# Patient Record
Sex: Male | Born: 1955 | Race: White | Hispanic: No | Smoking: Never smoker
Health system: Southern US, Community
[De-identification: ages and names within clinical notes are randomized; demographics above are authoritative.]

## PROBLEM LIST (undated history)

## (undated) DIAGNOSIS — N189 Chronic kidney disease, unspecified: Secondary | ICD-10-CM

## (undated) DIAGNOSIS — Z87442 Personal history of urinary calculi: Secondary | ICD-10-CM

## (undated) DIAGNOSIS — I739 Peripheral vascular disease, unspecified: Secondary | ICD-10-CM

## (undated) DIAGNOSIS — D72829 Elevated white blood cell count, unspecified: Secondary | ICD-10-CM

## (undated) DIAGNOSIS — E119 Type 2 diabetes mellitus without complications: Secondary | ICD-10-CM

## (undated) DIAGNOSIS — N183 Chronic kidney disease, stage 3 unspecified: Secondary | ICD-10-CM

## (undated) DIAGNOSIS — I10 Essential (primary) hypertension: Secondary | ICD-10-CM

## (undated) HISTORY — PX: MENISCUS REPAIR: SHX5179

---

## 2002-03-06 ENCOUNTER — Emergency Department (HOSPITAL_COMMUNITY): Admission: EM | Admit: 2002-03-06 | Discharge: 2002-03-06 | Payer: Self-pay | Admitting: Emergency Medicine

## 2005-04-19 ENCOUNTER — Emergency Department (HOSPITAL_COMMUNITY): Admission: EM | Admit: 2005-04-19 | Discharge: 2005-04-19 | Payer: Self-pay | Admitting: *Deleted

## 2005-04-21 ENCOUNTER — Ambulatory Visit (HOSPITAL_COMMUNITY): Admission: AD | Admit: 2005-04-21 | Discharge: 2005-04-21 | Payer: Self-pay | Admitting: Urology

## 2005-04-29 ENCOUNTER — Ambulatory Visit (HOSPITAL_COMMUNITY): Admission: RE | Admit: 2005-04-29 | Discharge: 2005-04-29 | Payer: Self-pay | Admitting: Urology

## 2015-10-05 HISTORY — PX: ROTATOR CUFF REPAIR: SHX139

## 2016-12-28 ENCOUNTER — Emergency Department (HOSPITAL_COMMUNITY): Payer: BLUE CROSS/BLUE SHIELD

## 2016-12-28 ENCOUNTER — Encounter (HOSPITAL_COMMUNITY): Payer: Self-pay

## 2016-12-28 ENCOUNTER — Emergency Department (HOSPITAL_COMMUNITY)
Admission: EM | Admit: 2016-12-28 | Discharge: 2016-12-29 | Disposition: A | Discharge status: Home / Self Care | Payer: BLUE CROSS/BLUE SHIELD | Attending: Emergency Medicine | Admitting: Emergency Medicine

## 2016-12-28 DIAGNOSIS — R1030 Lower abdominal pain, unspecified: Secondary | ICD-10-CM | POA: Diagnosis present

## 2016-12-28 DIAGNOSIS — E860 Dehydration: Secondary | ICD-10-CM | POA: Insufficient documentation

## 2016-12-28 DIAGNOSIS — N200 Calculus of kidney: Secondary | ICD-10-CM

## 2016-12-28 DIAGNOSIS — N1330 Unspecified hydronephrosis: Secondary | ICD-10-CM | POA: Insufficient documentation

## 2016-12-28 DIAGNOSIS — R109 Unspecified abdominal pain: Secondary | ICD-10-CM

## 2016-12-28 MED ORDER — TAMSULOSIN HCL 0.4 MG PO CAPS
0.4000 mg | ORAL_CAPSULE | ORAL | Status: AC
  Administered 2016-12-28: 0.4 mg via ORAL
  Filled 2016-12-28: qty 1

## 2016-12-28 MED ORDER — KETOROLAC TROMETHAMINE 30 MG/ML IJ SOLN
30.0000 mg | Freq: Once | INTRAMUSCULAR | Status: AC
  Administered 2016-12-28: 30 mg via INTRAVENOUS
  Filled 2016-12-28: qty 1

## 2016-12-28 MED ORDER — OXYCODONE-ACETAMINOPHEN 5-325 MG PO TABS
ORAL_TABLET | ORAL | Status: AC
  Filled 2016-12-28: qty 1

## 2016-12-28 MED ORDER — ONDANSETRON HCL 4 MG/2ML IJ SOLN
4.0000 mg | Freq: Once | INTRAMUSCULAR | Status: AC
  Administered 2016-12-28: 4 mg via INTRAVENOUS
  Filled 2016-12-28: qty 2

## 2016-12-28 MED ORDER — HYDROMORPHONE HCL 1 MG/ML IJ SOLN
1.0000 mg | Freq: Once | INTRAMUSCULAR | Status: AC
  Administered 2016-12-28: 1 mg via INTRAVENOUS
  Filled 2016-12-28: qty 1

## 2016-12-28 MED ORDER — ONDANSETRON 4 MG PO TBDP
ORAL_TABLET | ORAL | Status: AC
  Filled 2016-12-28: qty 1

## 2016-12-28 MED ORDER — SODIUM CHLORIDE 0.9 % IV SOLN
Freq: Once | INTRAVENOUS | Status: AC
  Administered 2016-12-28: 50 mL/h via INTRAVENOUS

## 2016-12-28 NOTE — ED Notes (Signed)
Per EDP, patient may have ice chips.  This nurse gave patient ice chips.

## 2016-12-28 NOTE — ED Triage Notes (Signed)
Pt presents to the ed with pain in his right flank that has moved down since yesterday when it started, today it got severe, has a history of kidney stone.  Pt reports using the bathroom like normal and has had minimal nausea

## 2016-12-28 NOTE — ED Provider Notes (Signed)
[facility]-EMERGENCY DEPT Provider Note   CSN: [id number] Arrival date & time: 12/28/16  1800     History   Chief Complaint Chief Complaint  Patient presents with  . Flank Pain    HPI Brent Lam is a 61 y.o. male.  12-year-old male with a history of kidney stones who for the past 3 days has had intermittent pain.  He states on Sunday had 15 hours of continuous pain that spontaneously resolved.  He was okay on Monday with the pain to return today.  He been going on for the past 12 hours.  Initially started in the flank area but now is located in the groin.  He states his last kidney stone was partially 5 years ago and he needed lithotripsy to pass the stone.      History reviewed. No pertinent past medical history.  There are no active problems to display for this patient.   History reviewed. No pertinent surgical history.     Home Medications    Prior to Admission medications   Medication Sig Start Date End Date Taking? Authorizing Provider  naproxen sodium (ANAPROX) 220 MG tablet Take 220 mg by mouth as needed (for pain).   Yes Historical Provider, MD  ondansetron (ZOFRAN) 4 MG tablet Take 1 tablet (4 mg total) by mouth every 6 (six) hours. 12/29/16   Earley Favor, NP  oxyCODONE-acetaminophen (PERCOCET/ROXICET) 5-325 MG tablet Take 1-2 tablets by mouth every 6 (six) hours as needed for severe pain. 12/29/16   Earley Favor, NP  tamsulosin (FLOMAX) 0.4 MG CAPS capsule Take 1 capsule (0.4 mg total) by mouth daily. 12/29/16   Earley Favor, NP    Family History No family history on file.  Social History Social History  Substance Use Topics  . Smoking status: Never Smoker  . Smokeless tobacco: Never Used  . Alcohol use Not on file     Allergies   Patient has no known allergies.   Review of Systems Review of Systems  Constitutional: Negative for chills and fever.  Gastrointestinal: Positive for nausea. Negative for abdominal pain.  Genitourinary: Positive for flank pain.  Negative for dysuria, frequency and hematuria.  All other systems reviewed and are negative.    Physical Exam Updated Vital Signs BP 134/84   Pulse 85   Temp 97.9 F (36.6 C) (Oral)   Resp 18   Ht 6\' 1"  (1.854 m)   Wt 106.6 kg   SpO2 94%   BMI 31.00 kg/m   Physical Exam  Constitutional: He appears well-developed and well-nourished.  Neck: Normal range of motion.  Cardiovascular: Normal rate.   Pulmonary/Chest: Effort normal.  Abdominal: Soft. Bowel sounds are normal. He exhibits no distension. There is no tenderness.  Musculoskeletal: Normal range of motion.  Neurological: He is alert.  Skin: Skin is warm and dry. There is pallor.  Nursing note and vitals reviewed.    ED Treatments / Results  Labs (all labs ordered are listed, but only abnormal results are displayed) Labs Reviewed  CBC WITH DIFFERENTIAL/PLATELET - Abnormal; Notable for the following:       Result Value   WBC 11.9 (*)    Neutro Abs 10.4 (*)    All other components within normal limits  I-STAT CHEM 8, ED - Abnormal; Notable for the following:    BUN 21 (*)    Creatinine, Ser 1.40 (*)    Glucose, Bld 281 (*)    Calcium, Ion 1.11 (*)    All other components within  normal limits  URINALYSIS, ROUTINE W REFLEX MICROSCOPIC    EKG  EKG Interpretation None       Radiology Ct Renal Stone Study  Result Date: 12/28/2016 CLINICAL DATA:  Right flank pain EXAM: CT ABDOMEN AND PELVIS WITHOUT CONTRAST TECHNIQUE: Multidetector CT imaging of the abdomen and pelvis was performed following the standard protocol without IV contrast. COMPARISON:  Seven/ 17/0 6 FINDINGS: Lower chest: Lung bases shows no acute findings. Hepatobiliary: No focal liver abnormality is seen. No gallstones, gallbladder wall thickening, or biliary dilatation. Pancreas: Unremarkable. No pancreatic ductal dilatation or surrounding inflammatory changes. Spleen: Normal in size without focal abnormality. Adrenals/Urinary Tract: No adrenal gland  mass. There is punctate nonobstructive calculus in upper pole of the right kidney measures 1.8 mm. Nonobstructive calculus in lower pole of the left kidney measures 4 mm. Mild right hydronephrosis and right hydroureter. Significant right perinephric and periureteral stranding. Axial image 88 there is calcified obstructive calculus in right UVJ/ urinary bladder wall measures 3.7 mm. Mild distension of distal right ureter. No left hydronephrosis. No left ureteral calculi. No thickening of urinary bladder wall. Stomach/Bowel: No small bowel obstruction. No thickened or dilated small bowel loops. Normal appendix noted in axial image 67. No pericecal inflammation. Vascular/Lymphatic: Mild atherosclerotic calcifications of abdominal aorta distally. No aortic aneurysm. Reproductive: Prostate gland and seminal vesicles are unremarkable. Other: No ascites or free abdominal air.  No inguinal adenopathy. Musculoskeletal: Sagittal images of the spine shows mild degenerative changes thoracolumbar spine. IMPRESSION: 1. There is bilateral nonobstructive nephrolithiasis. Mild right hydronephrosis and right hydroureter. Right perinephric and periureteral stranding. 2. Axial image 88 there is 4 mm calcified obstructive calculus in right UVJ/urinary bladder wall. 3. No thickening of urinary bladder wall. 4. No small bowel obstruction. 5. Normal appendix.  No pericecal inflammation. 6. Mild degenerative changes thoracolumbar spine. Electronically Signed   By: Natasha Mead M.D.   On: 12/28/2016 21:57    Procedures Procedures (including critical care time)  Medications Ordered in ED Medications  ondansetron (ZOFRAN-ODT) 4 MG disintegrating tablet (not administered)  oxyCODONE-acetaminophen (PERCOCET/ROXICET) 5-325 MG per tablet (not administered)  oxyCODONE-acetaminophen (PERCOCET/ROXICET) 5-325 MG per tablet (not administered)  0.9 %  sodium chloride infusion ( Intravenous Stopped 12/28/16 2331)  HYDROmorphone (DILAUDID)  injection 1 mg (1 mg Intravenous Given 12/28/16 2153)  ondansetron (ZOFRAN) injection 4 mg (4 mg Intravenous Given 12/28/16 2153)  ketorolac (TORADOL) 30 MG/ML injection 30 mg (30 mg Intravenous Given 12/28/16 2248)  tamsulosin (FLOMAX) capsule 0.4 mg (0.4 mg Oral Given 12/28/16 2247)  HYDROmorphone (DILAUDID) injection 1 mg (1 mg Intravenous Given 12/29/16 0054)     Initial Impression / Assessment and Plan / ED Course  I have reviewed the triage vital signs and the nursing notes.  Pertinent labs & imaging results that were available during my care of the patient were reviewed by me and considered in my medical decision making (see chart for details).      Pain has been controlled will DC home with Percocet, Zofran and Flomax with Urology FU  Final Clinical Impressions(s) / ED Diagnoses   Final diagnoses:  Kidney stone  Flank pain  Dehydration    New Prescriptions New Prescriptions   ONDANSETRON (ZOFRAN) 4 MG TABLET    Take 1 tablet (4 mg total) by mouth every 6 (six) hours.   OXYCODONE-ACETAMINOPHEN (PERCOCET/ROXICET) 5-325 MG TABLET    Take 1-2 tablets by mouth every 6 (six) hours as needed for severe pain.   TAMSULOSIN (FLOMAX) 0.4 MG CAPS CAPSULE  Take 1 capsule (0.4 mg total) by mouth daily.     Earley FavorGail Leo Fray, NP 12/29/16 0121    Mancel BaleElliott Wentz, MD 12/29/16 [phone]

## 2016-12-28 NOTE — ED Notes (Addendum)
Nurse drawing labs. 

## 2016-12-28 NOTE — ED Notes (Signed)
Patient transported to CT 

## 2016-12-29 LAB — CBC WITH DIFFERENTIAL/PLATELET
BASOS PCT: 0 %
Basophils Absolute: 0 10*3/uL (ref 0.0–0.1)
EOS ABS: 0 10*3/uL (ref 0.0–0.7)
Eosinophils Relative: 0 %
HEMATOCRIT: 43.3 % (ref 39.0–52.0)
HEMOGLOBIN: 15 g/dL (ref 13.0–17.0)
Lymphocytes Relative: 8 %
Lymphs Abs: 0.9 10*3/uL (ref 0.7–4.0)
MCH: 30.2 pg (ref 26.0–34.0)
MCHC: 34.6 g/dL (ref 30.0–36.0)
MCV: 87.1 fL (ref 78.0–100.0)
MONOS PCT: 5 %
Monocytes Absolute: 0.6 10*3/uL (ref 0.1–1.0)
NEUTROS ABS: 10.4 10*3/uL — AB (ref 1.7–7.7)
NEUTROS PCT: 87 %
Platelets: 173 10*3/uL (ref 150–400)
RBC: 4.97 MIL/uL (ref 4.22–5.81)
RDW: 13.8 % (ref 11.5–15.5)
WBC: 11.9 10*3/uL — AB (ref 4.0–10.5)

## 2016-12-29 LAB — I-STAT CHEM 8, ED
BUN: 21 mg/dL — AB (ref 6–20)
CHLORIDE: 101 mmol/L (ref 101–111)
Calcium, Ion: 1.11 mmol/L — ABNORMAL LOW (ref 1.15–1.40)
Creatinine, Ser: 1.4 mg/dL — ABNORMAL HIGH (ref 0.61–1.24)
Glucose, Bld: 281 mg/dL — ABNORMAL HIGH (ref 65–99)
HEMATOCRIT: 43 % (ref 39.0–52.0)
Hemoglobin: 14.6 g/dL (ref 13.0–17.0)
Potassium: 4.3 mmol/L (ref 3.5–5.1)
SODIUM: 136 mmol/L (ref 135–145)
TCO2: 23 mmol/L (ref 0–100)

## 2016-12-29 MED ORDER — HYDROMORPHONE HCL 1 MG/ML IJ SOLN
1.0000 mg | Freq: Once | INTRAMUSCULAR | Status: AC
  Administered 2016-12-29: 1 mg via INTRAVENOUS
  Filled 2016-12-29: qty 1

## 2016-12-29 MED ORDER — TAMSULOSIN HCL 0.4 MG PO CAPS: 0.4000 mg | ORAL_CAPSULE | Freq: Every day | ORAL | 0 refills | Status: DC

## 2016-12-29 MED ORDER — ONDANSETRON HCL 4 MG PO TABS: 4.0000 mg | ORAL_TABLET | Freq: Four times a day (QID) | ORAL | 0 refills | Status: DC

## 2016-12-29 MED ORDER — OXYCODONE-ACETAMINOPHEN 5-325 MG PO TABS: 1.0000 | ORAL_TABLET | Freq: Four times a day (QID) | ORAL | 0 refills | Status: DC | PRN

## 2016-12-29 NOTE — ED Provider Notes (Signed)
  Face-to-face evaluation   History: He complains of intermittent right lower quadrant abdominal pain present for several days, worse today.  Urinary urgency without hematuria.  Physical exam: Alert, cooperative.  No respiratory distress.  He is lucid.  Moves arms and legs easily.  Medical screening examination/treatment/procedure(s) were conducted as a shared visit with non-physician practitioner(s) and myself.  I personally evaluated the patient during the encounter   Mancel BaleElliott Molly Savarino, MD 12/29/16 [phone]

## 2016-12-29 NOTE — Discharge Instructions (Addendum)
Evan given Zofran for nausea control, Percocet for pain control and Flomax that you should take on a regular basis to help dilate the ureters and hopefully will allow you to pass the kidney stone Please call the urologist tomorrow to set an appointment for follow-up.

## 2017-06-17 ENCOUNTER — Emergency Department (HOSPITAL_COMMUNITY)
Admission: EM | Admit: 2017-06-17 | Discharge: 2017-06-17 | Disposition: A | Discharge status: Home / Self Care | Payer: BLUE CROSS/BLUE SHIELD | Attending: Emergency Medicine | Admitting: Emergency Medicine

## 2017-06-17 ENCOUNTER — Encounter (HOSPITAL_COMMUNITY): Payer: Self-pay

## 2017-06-17 DIAGNOSIS — Z5321 Procedure and treatment not carried out due to patient leaving prior to being seen by health care provider: Secondary | ICD-10-CM | POA: Insufficient documentation

## 2017-06-17 DIAGNOSIS — R202 Paresthesia of skin: Secondary | ICD-10-CM | POA: Insufficient documentation

## 2017-06-17 NOTE — ED Notes (Signed)
Family informed staff that pt is leaving and not willing to wait in lobby. Saw pt and family walking out of department

## 2017-06-17 NOTE — ED Triage Notes (Signed)
Pt presents to the ed with complaints of being bitten by a black snake. States that the snake fell down and bit him on the head. The patient is very anxious and upset saying his face feels tingly.  There is no obvious bite marks, several small scratches to the left side of his head.

## 2019-03-13 ENCOUNTER — Encounter: Payer: Self-pay | Admitting: *Deleted

## 2019-03-13 ENCOUNTER — Other Ambulatory Visit: Payer: Self-pay

## 2019-03-15 ENCOUNTER — Other Ambulatory Visit: Payer: Self-pay

## 2019-03-15 ENCOUNTER — Encounter
Admission: RE | Admit: 2019-03-15 | Discharge: 2019-03-15 | Disposition: A | Discharge status: Home / Self Care | Payer: HRSA Program | Source: Ambulatory Visit | Attending: Ophthalmology | Admitting: Ophthalmology

## 2019-03-15 DIAGNOSIS — Z1159 Encounter for screening for other viral diseases: Secondary | ICD-10-CM | POA: Insufficient documentation

## 2019-03-15 NOTE — Discharge Instructions (Signed)

## 2019-03-16 LAB — NOVEL CORONAVIRUS, NAA (HOSP ORDER, SEND-OUT TO REF LAB; TAT 18-24 HRS): SARS-CoV-2, NAA: NOT DETECTED

## 2019-03-19 ENCOUNTER — Ambulatory Visit: Payer: Self-pay | Admitting: Anesthesiology

## 2019-03-19 ENCOUNTER — Ambulatory Visit
Admission: RE | Admit: 2019-03-19 | Discharge: 2019-03-19 | Disposition: A | Discharge status: Home / Self Care | Payer: Self-pay | Attending: Ophthalmology | Admitting: Ophthalmology

## 2019-03-19 ENCOUNTER — Encounter
Admission: RE | Disposition: A | Discharge status: Home / Self Care | Payer: Self-pay | Source: Home / Self Care | Attending: Ophthalmology

## 2019-03-19 DIAGNOSIS — I10 Essential (primary) hypertension: Secondary | ICD-10-CM | POA: Insufficient documentation

## 2019-03-19 DIAGNOSIS — Z7984 Long term (current) use of oral hypoglycemic drugs: Secondary | ICD-10-CM | POA: Insufficient documentation

## 2019-03-19 DIAGNOSIS — E1136 Type 2 diabetes mellitus with diabetic cataract: Secondary | ICD-10-CM | POA: Insufficient documentation

## 2019-03-19 DIAGNOSIS — Z79899 Other long term (current) drug therapy: Secondary | ICD-10-CM | POA: Insufficient documentation

## 2019-03-19 DIAGNOSIS — H2512 Age-related nuclear cataract, left eye: Secondary | ICD-10-CM | POA: Insufficient documentation

## 2019-03-19 HISTORY — DX: Type 2 diabetes mellitus without complications: E11.9

## 2019-03-19 HISTORY — PX: CATARACT EXTRACTION W/PHACO: SHX586

## 2019-03-19 HISTORY — DX: Essential (primary) hypertension: I10

## 2019-03-19 LAB — GLUCOSE, CAPILLARY
Glucose-Capillary: 142 mg/dL — ABNORMAL HIGH (ref 70–99)
Glucose-Capillary: 143 mg/dL — ABNORMAL HIGH (ref 70–99)

## 2019-03-19 SURGERY — PHACOEMULSIFICATION, CATARACT, WITH IOL INSERTION
Anesthesia: Monitor Anesthesia Care | Site: Eye | Laterality: Left

## 2019-03-19 MED ORDER — ONDANSETRON HCL 4 MG/2ML IJ SOLN: 4.0000 mg | Freq: Once | INTRAMUSCULAR | Status: DC | PRN

## 2019-03-19 MED ORDER — EPINEPHRINE PF 1 MG/ML IJ SOLN
INTRAOCULAR | Status: DC | PRN
  Administered 2019-03-19: 85 mL via OPHTHALMIC

## 2019-03-19 MED ORDER — FENTANYL CITRATE (PF) 100 MCG/2ML IJ SOLN
INTRAMUSCULAR | Status: DC | PRN
  Administered 2019-03-19: 50 ug via INTRAVENOUS

## 2019-03-19 MED ORDER — LIDOCAINE HCL (PF) 2 % IJ SOLN
INTRAOCULAR | Status: DC | PRN
  Administered 2019-03-19: 1 mL via INTRAOCULAR

## 2019-03-19 MED ORDER — SODIUM HYALURONATE 23 MG/ML IO SOLN
INTRAOCULAR | Status: DC | PRN
  Administered 2019-03-19: 0.6 mL via INTRAOCULAR

## 2019-03-19 MED ORDER — ARMC OPHTHALMIC DILATING DROPS
1.0000 "application " | OPHTHALMIC | Status: DC | PRN
  Administered 2019-03-19 (×3): 1 via OPHTHALMIC

## 2019-03-19 MED ORDER — TETRACAINE HCL 0.5 % OP SOLN
1.0000 [drp] | OPHTHALMIC | Status: DC | PRN
  Administered 2019-03-19 (×3): 1 [drp] via OPHTHALMIC

## 2019-03-19 MED ORDER — MOXIFLOXACIN HCL 0.5 % OP SOLN
OPHTHALMIC | Status: DC | PRN
  Administered 2019-03-19: 0.2 mL via OPHTHALMIC

## 2019-03-19 MED ORDER — MIDAZOLAM HCL 2 MG/2ML IJ SOLN
INTRAMUSCULAR | Status: DC | PRN
  Administered 2019-03-19: 2 mg via INTRAVENOUS

## 2019-03-19 MED ORDER — SODIUM HYALURONATE 10 MG/ML IO SOLN
INTRAOCULAR | Status: DC | PRN
  Administered 2019-03-19: 0.55 mL via INTRAOCULAR

## 2019-03-19 SURGICAL SUPPLY — 19 items
CANNULA ANT/CHMB 27G (MISCELLANEOUS) ×2 IMPLANT
CANNULA ANT/CHMB 27GA (MISCELLANEOUS) ×6 IMPLANT
DISSECTOR HYDRO NUCLEUS 50X22 (MISCELLANEOUS) ×3 IMPLANT
GLOVE SURG LX 7.5 STRW (GLOVE) ×4
GLOVE SURG LX STRL 7.5 STRW (GLOVE) ×1 IMPLANT
GLOVE SURG SYN 8.5  E (GLOVE) ×2
GLOVE SURG SYN 8.5 E (GLOVE) ×1 IMPLANT
GLOVE SURG SYN 8.5 PF PI (GLOVE) ×1 IMPLANT
GOWN STRL REUS W/ TWL LRG LVL3 (GOWN DISPOSABLE) ×2 IMPLANT
GOWN STRL REUS W/TWL LRG LVL3 (GOWN DISPOSABLE) ×6
MARKER SKIN DUAL TIP RULER LAB (MISCELLANEOUS) ×3 IMPLANT
PACK DR. KING ARMS (PACKS) ×3 IMPLANT
PACK EYE AFTER SURG (MISCELLANEOUS) ×3 IMPLANT
PACK OPTHALMIC (MISCELLANEOUS) ×3 IMPLANT
SYR 3ML LL SCALE MARK (SYRINGE) ×3 IMPLANT
SYR TB 1ML LUER SLIP (SYRINGE) ×3 IMPLANT
TECHNIS SYMFONY LENS ZXT 225 (Intraocular Lens) ×2 IMPLANT
WATER STERILE IRR 250ML POUR (IV SOLUTION) ×3 IMPLANT
WIPE NON LINTING 3.25X3.25 (MISCELLANEOUS) ×3 IMPLANT

## 2019-03-19 NOTE — H&P (Signed)

## 2019-03-19 NOTE — Anesthesia Preprocedure Evaluation (Signed)
Anesthesia Evaluation  Patient identified by MRN, date of birth, ID band  Reviewed: Allergy & Precautions, NPO status , Patient's Chart, lab work & pertinent test results  Airway Mallampati: II  TM Distance: >3 FB     Dental   Pulmonary neg pulmonary ROS,    breath sounds clear to auscultation       Cardiovascular hypertension,  Rhythm:Regular Rate:Normal     Neuro/Psych    GI/Hepatic   Endo/Other  diabetes, Type 2  Renal/GU      Musculoskeletal   Abdominal   Peds  Hematology   Anesthesia Other Findings   Reproductive/Obstetrics                             Anesthesia Physical Anesthesia Plan  ASA: II  Anesthesia Plan: MAC   Post-op Pain Management:    Induction: Intravenous  PONV Risk Score and Plan:   Airway Management Planned: Nasal Cannula and Natural Airway  Additional Equipment:   Intra-op Plan:   Post-operative Plan:   Informed Consent: I have reviewed the patients History and Physical, chart, labs and discussed the procedure including the risks, benefits and alternatives for the proposed anesthesia with the patient or authorized representative who has indicated his/her understanding and acceptance.       Plan Discussed with: CRNA  Anesthesia Plan Comments:         Anesthesia Quick Evaluation

## 2019-03-19 NOTE — Anesthesia Procedure Notes (Signed)
Procedure Name: MAC Date/Time: 03/19/2019 12:05 PM Performed by: Janna Arch, CRNA Pre-anesthesia Checklist: Patient identified, Emergency Drugs available, Suction available, Timeout performed and Patient being monitored Patient Re-evaluated:Patient Re-evaluated prior to induction Oxygen Delivery Method: Nasal cannula Placement Confirmation: positive ETCO2

## 2019-03-19 NOTE — Transfer of Care (Signed)
Immediate Anesthesia Transfer of Care Note  Patient: Brent Lam  Procedure(s) Performed: CATARACT EXTRACTION PHACO AND INTRAOCULAR LENS PLACEMENT (IOC)  LEFT DIABETIC symfony lens (Left Eye)  Patient Location: PACU  Anesthesia Type: MAC  Level of Consciousness: awake, alert  and patient cooperative  Airway and Oxygen Therapy: Patient Spontanous Breathing and Patient connected to supplemental oxygen  Post-op Assessment: Post-op Vital signs reviewed, Patient's Cardiovascular Status Stable, Respiratory Function Stable, Patent Airway and No signs of Nausea or vomiting  Post-op Vital Signs: Reviewed and stable  Complications: No apparent anesthesia complications

## 2019-03-19 NOTE — Anesthesia Postprocedure Evaluation (Signed)
Anesthesia Post Note  Patient: Brent Lam  Procedure(s) Performed: CATARACT EXTRACTION PHACO AND INTRAOCULAR LENS PLACEMENT (IOC)  LEFT DIABETIC symfony lens (Left Eye)  Patient location during evaluation: PACU Anesthesia Type: MAC Level of consciousness: awake and alert Pain management: pain level controlled Vital Signs Assessment: post-procedure vital signs reviewed and stable Respiratory status: spontaneous breathing, nonlabored ventilation, respiratory function stable and patient connected to nasal cannula oxygen Cardiovascular status: stable and blood pressure returned to baseline Postop Assessment: no apparent nausea or vomiting Anesthetic complications: no    Veda Canning

## 2019-03-19 NOTE — Op Note (Signed)
OPERATIVE NOTE  Brent Lam [id number] 03/19/2019   PREOPERATIVE DIAGNOSIS:  Nuclear sclerotic cataract left eye.  H25.12   POSTOPERATIVE DIAGNOSIS:    Nuclear sclerotic cataract left eye.     PROCEDURE:  Phacoemusification with posterior chamber intraocular lens placement of the left eye   LENS:   Implant Name Type Inv. Item Serial No. Manufacturer Lot No. LRB No. Used Action  TECHNIS SYMFONY LENS ZXT 225 Intraocular Lens  [id number] [employer]  Left 1 Implanted       [id number] +18.5 rotated to 003   ULTRASOUND TIME: 2 minutes 17 seconds.  CDE 20.17   SURGEON:  Benay Pillow, MD, MPH   ANESTHESIA:  Topical with tetracaine drops augmented with 1% preservative-free intracameral lidocaine.  ESTIMATED BLOOD LOSS: <1 mL   COMPLICATIONS:  None.   DESCRIPTION OF PROCEDURE:  The patient was identified in the holding room and transported to the operating room and placed in the supine position under the operating microscope.  The left eye was identified as the operative eye and it was prepped and draped in the usual sterile ophthalmic fashion.  The verion system was registered.   A 1.0 millimeter clear-corneal paracentesis was made at the 5:00 position. 0.5 ml of preservative-free 1% lidocaine with epinephrine was injected into the anterior chamber.  The anterior chamber was filled with Healon 5 viscoelastic.  A 2.4 millimeter keratome was used to make a near-clear corneal incision at the 2:00 position.  A curvilinear capsulorrhexis was made with a cystotome and capsulorrhexis forceps.  Balanced salt solution was used to hydrodissect and hydrodelineate the nucleus.   Phacoemulsification was then used in stop and chop fashion to remove the lens nucleus and epinucleus.  The remaining cortex was then removed using the irrigation and aspiration handpiece. Healon was then placed into the capsular bag to distend it for lens placement.  A lens was then injected into the capsular bag.  The  remaining viscoelastic was aspirated.  The lens was rotated to 003.  It was noted that there was a small defect in the lens near one of the optic-haptic junctions, but it was peripheral and deemed unlikely to be visually significant.  The lens was stable and well centered and it was thought that removal and replacement of the lens would be more likely to cause harm than provide any significant benefit given the small and peripheral defect of the intraocular lens.    Wounds were hydrated with balanced salt solution.  The anterior chamber was inflated to a physiologic pressure with balanced salt solution.  Intracameral vigamox 0.1 mL undiltued was injected into the eye and a drop placed onto the ocular surface.  No wound leaks were noted.  The patient was taken to the recovery room in stable condition without complications of anesthesia or surgery  Benay Pillow 03/19/2019, 12:34 PM

## 2019-03-20 ENCOUNTER — Encounter: Payer: Self-pay | Admitting: Ophthalmology

## 2019-04-12 ENCOUNTER — Other Ambulatory Visit: Payer: Self-pay

## 2019-04-19 ENCOUNTER — Other Ambulatory Visit
Admission: RE | Admit: 2019-04-19 | Discharge: 2019-04-19 | Disposition: A | Discharge status: Home / Self Care | Payer: HRSA Program | Source: Ambulatory Visit | Attending: Ophthalmology | Admitting: Ophthalmology

## 2019-04-19 ENCOUNTER — Other Ambulatory Visit: Payer: Self-pay

## 2019-04-19 DIAGNOSIS — Z1159 Encounter for screening for other viral diseases: Secondary | ICD-10-CM | POA: Diagnosis present

## 2019-04-19 LAB — SARS CORONAVIRUS 2 (TAT 6-24 HRS): SARS Coronavirus 2: NEGATIVE

## 2019-04-19 NOTE — Discharge Instructions (Signed)

## 2019-04-23 ENCOUNTER — Ambulatory Visit: Payer: Self-pay | Admitting: Anesthesiology

## 2019-04-23 ENCOUNTER — Encounter
Admission: RE | Disposition: A | Discharge status: Home / Self Care | Payer: Self-pay | Source: Home / Self Care | Attending: Ophthalmology

## 2019-04-23 ENCOUNTER — Ambulatory Visit
Admission: RE | Admit: 2019-04-23 | Discharge: 2019-04-23 | Disposition: A | Discharge status: Home / Self Care | Payer: Self-pay | Attending: Ophthalmology | Admitting: Ophthalmology

## 2019-04-23 DIAGNOSIS — Z79899 Other long term (current) drug therapy: Secondary | ICD-10-CM | POA: Insufficient documentation

## 2019-04-23 DIAGNOSIS — Z7984 Long term (current) use of oral hypoglycemic drugs: Secondary | ICD-10-CM | POA: Insufficient documentation

## 2019-04-23 DIAGNOSIS — I10 Essential (primary) hypertension: Secondary | ICD-10-CM | POA: Insufficient documentation

## 2019-04-23 DIAGNOSIS — E1136 Type 2 diabetes mellitus with diabetic cataract: Secondary | ICD-10-CM | POA: Diagnosis not present

## 2019-04-23 DIAGNOSIS — H2511 Age-related nuclear cataract, right eye: Secondary | ICD-10-CM | POA: Diagnosis present

## 2019-04-23 HISTORY — PX: CATARACT EXTRACTION W/PHACO: SHX586

## 2019-04-23 HISTORY — DX: Personal history of urinary calculi: Z87.442

## 2019-04-23 LAB — GLUCOSE, CAPILLARY
Glucose-Capillary: 140 mg/dL — ABNORMAL HIGH (ref 70–99)
Glucose-Capillary: 141 mg/dL — ABNORMAL HIGH (ref 70–99)

## 2019-04-23 SURGERY — PHACOEMULSIFICATION, CATARACT, WITH IOL INSERTION
Anesthesia: Monitor Anesthesia Care | Site: Eye | Laterality: Right

## 2019-04-23 MED ORDER — LIDOCAINE HCL (PF) 2 % IJ SOLN
INTRAOCULAR | Status: DC | PRN
  Administered 2019-04-23: 1 mL via INTRAOCULAR

## 2019-04-23 MED ORDER — FENTANYL CITRATE (PF) 100 MCG/2ML IJ SOLN
INTRAMUSCULAR | Status: DC | PRN
  Administered 2019-04-23: 50 ug via INTRAVENOUS

## 2019-04-23 MED ORDER — TETRACAINE HCL 0.5 % OP SOLN
1.0000 [drp] | OPHTHALMIC | Status: DC | PRN
  Administered 2019-04-23 (×3): 1 [drp] via OPHTHALMIC

## 2019-04-23 MED ORDER — SODIUM HYALURONATE 10 MG/ML IO SOLN
INTRAOCULAR | Status: DC | PRN
  Administered 2019-04-23: 0.55 mL via INTRAOCULAR

## 2019-04-23 MED ORDER — MOXIFLOXACIN HCL 0.5 % OP SOLN
OPHTHALMIC | Status: DC | PRN
  Administered 2019-04-23: 0.2 mL via OPHTHALMIC

## 2019-04-23 MED ORDER — SODIUM HYALURONATE 23 MG/ML IO SOLN
INTRAOCULAR | Status: DC | PRN
  Administered 2019-04-23: 0.6 mL via INTRAOCULAR

## 2019-04-23 MED ORDER — ARMC OPHTHALMIC DILATING DROPS
1.0000 "application " | OPHTHALMIC | Status: DC | PRN
  Administered 2019-04-23 (×3): 1 via OPHTHALMIC

## 2019-04-23 MED ORDER — EPINEPHRINE PF 1 MG/ML IJ SOLN
INTRAOCULAR | Status: DC | PRN
  Administered 2019-04-23: 13:00:00 91 mL via OPHTHALMIC

## 2019-04-23 MED ORDER — MIDAZOLAM HCL 2 MG/2ML IJ SOLN
INTRAMUSCULAR | Status: DC | PRN
  Administered 2019-04-23 (×2): 1 mg via INTRAVENOUS

## 2019-04-23 SURGICAL SUPPLY — 21 items
CANNULA ANT/CHMB 27G (MISCELLANEOUS) ×2 IMPLANT
CANNULA ANT/CHMB 27GA (MISCELLANEOUS) ×6 IMPLANT
DISSECTOR HYDRO NUCLEUS 50X22 (MISCELLANEOUS) ×3 IMPLANT
GLOVE SURG LX 7.5 STRW (GLOVE) ×2
GLOVE SURG LX STRL 7.5 STRW (GLOVE) ×1 IMPLANT
GLOVE SURG SYN 8.5  E (GLOVE) ×2
GLOVE SURG SYN 8.5 E (GLOVE) ×1 IMPLANT
GLOVE SURG SYN 8.5 PF PI (GLOVE) ×1 IMPLANT
GOWN STRL REUS W/ TWL LRG LVL3 (GOWN DISPOSABLE) ×2 IMPLANT
GOWN STRL REUS W/TWL LRG LVL3 (GOWN DISPOSABLE) ×6
LENS IOL SYMFON TORIC 300 18.0 ×1 IMPLANT
LENS IOL SYMFONY TORIC 18.0 ×3 IMPLANT
LENS IOL SYMFONY TRC 300 18.0 IMPLANT
MARKER SKIN DUAL TIP RULER LAB (MISCELLANEOUS) ×3 IMPLANT
PACK DR. KING ARMS (PACKS) ×3 IMPLANT
PACK EYE AFTER SURG (MISCELLANEOUS) ×3 IMPLANT
PACK OPTHALMIC (MISCELLANEOUS) ×3 IMPLANT
SYR 3ML LL SCALE MARK (SYRINGE) ×3 IMPLANT
SYR TB 1ML LUER SLIP (SYRINGE) ×3 IMPLANT
WATER STERILE IRR 250ML POUR (IV SOLUTION) ×3 IMPLANT
WIPE NON LINTING 3.25X3.25 (MISCELLANEOUS) ×3 IMPLANT

## 2019-04-23 NOTE — Anesthesia Preprocedure Evaluation (Signed)
Anesthesia Evaluation  Patient identified by MRN, date of birth, ID band Patient awake    History of Anesthesia Complications Negative for: history of anesthetic complications  Airway Mallampati: III  TM Distance: >3 FB Neck ROM: Full    Dental no notable dental hx.    Pulmonary neg pulmonary ROS,    Pulmonary exam normal        Cardiovascular Exercise Tolerance: Good hypertension, Normal cardiovascular exam     Neuro/Psych    GI/Hepatic negative GI ROS, Neg liver ROS,   Endo/Other  diabetes  Renal/GU negative Renal ROS  negative genitourinary   Musculoskeletal negative musculoskeletal ROS (+)   Abdominal   Peds  Hematology negative hematology ROS (+)   Anesthesia Other Findings   Reproductive/Obstetrics                             Anesthesia Physical Anesthesia Plan  ASA: II  Anesthesia Plan: MAC   Post-op Pain Management:    Induction: Intravenous  PONV Risk Score and Plan:   Airway Management Planned: Nasal Cannula  Additional Equipment: None  Intra-op Plan:   Post-operative Plan:   Informed Consent: I have reviewed the patients History and Physical, chart, labs and discussed the procedure including the risks, benefits and alternatives for the proposed anesthesia with the patient or authorized representative who has indicated his/her understanding and acceptance.       Plan Discussed with: CRNA  Anesthesia Plan Comments:         Anesthesia Quick Evaluation

## 2019-04-23 NOTE — Anesthesia Procedure Notes (Signed)
Procedure Name: MAC Date/Time: 04/23/2019 12:59 PM Performed by: Cameron Ali, CRNA Pre-anesthesia Checklist: Patient identified, Emergency Drugs available, Suction available, Timeout performed and Patient being monitored Patient Re-evaluated:Patient Re-evaluated prior to induction Oxygen Delivery Method: Nasal cannula Placement Confirmation: positive ETCO2

## 2019-04-23 NOTE — Op Note (Signed)
OPERATIVE NOTE  Brent Lam [id number] 04/23/2019   PREOPERATIVE DIAGNOSIS:  Nuclear sclerotic cataract right eye.  H25.11   POSTOPERATIVE DIAGNOSIS:    Nuclear sclerotic cataract right eye.     PROCEDURE:  Phacoemusification with posterior chamber intraocular lens placement of the right eye   LENS:   Implant Name Type Inv. Item Serial No. Manufacturer Lot No. LRB No. Used Action  LENS IOL TORIC SYMFONY 18.0 - [id number]  LENS IOL TORIC SYMFONY 18.0 [id number] [employer]  Right 1 Implanted       [id number] +18.0 @178  degrees   ULTRASOUND TIME: 1 minutes 15 seconds.  CDE 8.04   SURGEON:  Benay Pillow, MD, MPH  ANESTHESIOLOGIST: Anesthesiologist: Page, Adele Barthel, MD CRNA: Cameron Ali, CRNA   ANESTHESIA:  Topical with tetracaine drops augmented with 1% preservative-free intracameral lidocaine.  ESTIMATED BLOOD LOSS: less than 1 mL.   COMPLICATIONS:  None.   DESCRIPTION OF PROCEDURE:  The patient was identified in the holding room and transported to the operating room and placed in the supine position under the operating microscope.  The right eye was identified as the operative eye and it was prepped and draped in the usual sterile ophthalmic fashion.  The eye was registered with the [place] system for toric alignment.   A 1.0 millimeter clear-corneal paracentesis was made at the 10:30 position. 0.5 ml of preservative-free 1% lidocaine with epinephrine was injected into the anterior chamber.  The anterior chamber was filled with Healon 5 viscoelastic.  A 2.4 millimeter keratome was used to make a near-clear corneal incision at the 8:00 position.  A curvilinear capsulorrhexis was made with a cystotome and capsulorrhexis forceps.  Balanced salt solution was used to hydrodissect and hydrodelineate the nucleus.   Phacoemulsification was then used in stop and chop fashion to remove the lens nucleus and epinucleus.  The remaining cortex was then removed using the irrigation and aspiration  handpiece. Healon was then placed into the capsular bag to distend it for lens placement.  A lens was then injected into the capsular bag.  The remaining viscoelastic was aspirated.  The lens was rotated to 178 degrees without difficulty with assistance from the [place] system.   Wounds were hydrated with balanced salt solution.  The anterior chamber was inflated to a physiologic pressure with balanced salt solution.   Intracameral vigamox 0.1 mL undiluted was injected into the eye and a drop placed onto the ocular surface.  No wound leaks were noted.  The patient was taken to the recovery room in stable condition without complications of anesthesia or surgery  Benay Pillow 04/23/2019, 1:28 PM

## 2019-04-23 NOTE — Transfer of Care (Signed)
Immediate Anesthesia Transfer of Care Note  Patient: Brent Lam  Procedure(s) Performed: CATARACT EXTRACTION PHACO AND INTRAOCULAR LENS PLACEMENT (IOC)  RIGHT DIABETIC SYMFONY TORIC LENS (Right Eye)  Patient Location: PACU  Anesthesia Type: MAC  Level of Consciousness: awake, alert  and patient cooperative  Airway and Oxygen Therapy: Patient Spontanous Breathing and Patient connected to supplemental oxygen  Post-op Assessment: Post-op Vital signs reviewed, Patient's Cardiovascular Status Stable, Respiratory Function Stable, Patent Airway and No signs of Nausea or vomiting  Post-op Vital Signs: Reviewed and stable  Complications: No apparent anesthesia complications

## 2019-04-23 NOTE — Anesthesia Postprocedure Evaluation (Signed)
Anesthesia Post Note  Patient: Brent Lam  Procedure(s) Performed: CATARACT EXTRACTION PHACO AND INTRAOCULAR LENS PLACEMENT (IOC)  RIGHT DIABETIC SYMFONY TORIC LENS (Right Eye)  Patient location during evaluation: PACU Anesthesia Type: MAC Level of consciousness: awake and alert Pain management: pain level controlled Vital Signs Assessment: post-procedure vital signs reviewed and stable Respiratory status: spontaneous breathing, nonlabored ventilation, respiratory function stable and patient connected to nasal cannula oxygen Cardiovascular status: stable and blood pressure returned to baseline Postop Assessment: no apparent nausea or vomiting Anesthetic complications: no    Brent Lam

## 2019-04-23 NOTE — H&P (Signed)

## 2019-04-24 ENCOUNTER — Encounter: Payer: Self-pay | Admitting: Ophthalmology

## 2020-01-20 ENCOUNTER — Ambulatory Visit
Admission: EM | Admit: 2020-01-20 | Discharge: 2020-01-20 | Disposition: A | Discharge status: Home / Self Care | Payer: BC Managed Care – PPO | Attending: Emergency Medicine | Admitting: Emergency Medicine

## 2020-01-20 ENCOUNTER — Other Ambulatory Visit: Payer: Self-pay

## 2020-01-20 DIAGNOSIS — L02612 Cutaneous abscess of left foot: Secondary | ICD-10-CM | POA: Diagnosis not present

## 2020-01-20 DIAGNOSIS — L089 Local infection of the skin and subcutaneous tissue, unspecified: Secondary | ICD-10-CM

## 2020-01-20 MED ORDER — DOXYCYCLINE HYCLATE 100 MG PO CAPS: 100.0000 mg | ORAL_CAPSULE | Freq: Two times a day (BID) | ORAL | 0 refills | Status: DC

## 2020-01-20 MED ORDER — CEFTRIAXONE SODIUM 1 G IJ SOLR
1.0000 g | Freq: Once | INTRAMUSCULAR | Status: AC
  Administered 2020-01-20: 09:00:00 1 g via INTRAMUSCULAR

## 2020-01-20 NOTE — ED Triage Notes (Signed)
Pt has infection in 4th left toe, , redness extends into foot . Pt states corn fell off toe

## 2020-01-20 NOTE — ED Provider Notes (Signed)
[place]   [id number] 01/20/20 Arrival Time: 5638   CC: ABSCESS/ foot infection  SUBJECTIVE:  Brent Lam is a 64 y.o. male who presents with a possible abscess/ infection of his LT toe x 1 week.  Worsening symptoms that began 2 days ago.  Speculates he may have had a corn fall off his toe.  Has tried epsom salt baths and peroxide without relief.  Worse with walking and to the touch.  Denies previous symptoms.  Complains of associated fever, tmax of 100.8 last night, 98.8 in office, swelling, spreading erythema, bleeding and drainage.  Took tylenol this AM. Denies changes in sensation, severe pain, changes in strength, nausea, vomiting.    Reports having a "touch of diabetes" blood sugar 130 at home this morning.    ROS: As per HPI.  All other pertinent ROS negative.     Past Medical History:  Diagnosis Date  . Diabetes mellitus, type 2 (HCC)    diet controlled  . History of kidney stones    X2 4or 5 yrs ago  . Hypertension    controlled on meds   Past Surgical History:  Procedure Laterality Date  . CATARACT EXTRACTION W/PHACO Left 03/19/2019   Procedure: CATARACT EXTRACTION PHACO AND INTRAOCULAR LENS PLACEMENT ([facility])  LEFT DIABETIC symfony lens;  Surgeon: Eulogio Bear, MD;  Location: [facility];  Service: Ophthalmology;  Laterality: Left;  Diabetic - oral meds  . CATARACT EXTRACTION W/PHACO Right 04/23/2019   Procedure: CATARACT EXTRACTION PHACO AND INTRAOCULAR LENS PLACEMENT ([place])  RIGHT DIABETIC SYMFONY TORIC LENS;  Surgeon: Eulogio Bear, MD;  Location: [facility];  Service: Ophthalmology;  Laterality: Right;  Diabetes-oral med  . ROTATOR CUFF REPAIR  10/2015   No Known Allergies No current facility-administered medications on file prior to encounter.   Current Outpatient Medications on File Prior to Encounter  Medication Sig Dispense Refill  . Cyanocobalamin (VITAMIN B-12 SL) Place under the tongue once a week.     . metFORMIN  (GLUCOPHAGE) 500 MG tablet Take 500 mg by mouth 2 (two) times daily with a meal.    . metoprolol tartrate (LOPRESSOR) 50 MG tablet Take 50 mg by mouth daily. am     Social History   Socioeconomic History  . Marital status: Legally Separated    Spouse name: Not on file  . Number of children: Not on file  . Years of education: Not on file  . Highest education level: Not on file  Occupational History  . Not on file  Tobacco Use  . Smoking status: Never Smoker  . Smokeless tobacco: Never Used  Substance and Sexual Activity  . Alcohol use: Not Currently  . Drug use: Never  . Sexual activity: Not on file  Other Topics Concern  . Not on file  Social History Narrative  . Not on file   Social Determinants of Health   Financial Resource Strain:   . Difficulty of Paying Living Expenses:   Food Insecurity:   . Worried About Charity fundraiser in the Last Year:   . Arboriculturist in the Last Year:   Transportation Needs:   . Film/video editor (Medical):   Marland Kitchen Lack of Transportation (Non-Medical):   Physical Activity:   . Days of Exercise per Week:   . Minutes of Exercise per Session:   Stress:   . Feeling of Stress :   Social Connections:   . Frequency of Communication with Friends and Family:   .  Frequency of Social Gatherings with Friends and Family:   . Attends Religious Services:   . Active Member of Clubs or Organizations:   . Attends Banker Meetings:   Marland Kitchen Marital Status:   Intimate Partner Violence:   . Fear of Current or Ex-Partner:   . Emotionally Abused:   Marland Kitchen Physically Abused:   . Sexually Abused:    Family History  Problem Relation Age of Onset  . Diabetes Mother   . Diabetes Father     OBJECTIVE:  Vitals:   01/20/20 0854  BP: (!) 167/93  Pulse: (!) 107  Resp: 18  Temp: 98.8 F (37.1 C)  SpO2: 94%     General appearance: alert; no distress CV: Dorsalis pedis pulse 1-2+; cap refill intact <2 seconds Foot: LT fourth toe mildly  TTP Skin: apx 0.5 cm round wound to medial aspect of LT fourth toe with surrounding erythema and swelling; erythema evident to mid foot (see pictures below) Neuro: Ambulates with antalgic gait Psychological: alert and cooperative; normal mood and affect       ASSESSMENT & PLAN:  1. Toe abscess, left   2. Foot infection     Meds ordered this encounter  Medications  . doxycycline (VIBRAMYCIN) 100 MG capsule    Sig: Take 1 capsule (100 mg total) by mouth 2 (two) times daily.    Dispense:  20 capsule    Refill:  0    Order Specific Question:   Supervising Provider    Answer:   Eustace Moore [[id number]]   Based on history, and exam I am recommending further evaluation and management in the ED.  Cannot rule out sepsis in Urgent Care.  Patient declines at this time.  Patient and wife aware of risk associated with this decision including delayed treatment, delayed diagnosis, tissue damage, organ damage, organ failure, loss of toe and/or foot, and possibly death.  Patient and wife aware and would like to try outpatient therapy first.    Gram of rocephin given in office Wash site daily with warm water and mild soap Keep covered to avoid friction Take antibiotic as prescribed and to completion Follow up tomorrow for recheck and for another gram of rocephin Go to the ED if you have any new or worsening symptoms increased redness, swelling, pain, nausea, vomiting, fever, chills, etc...    Reviewed expectations re: course of current medical issues. Questions answered. Outlined signs and symptoms indicating need for more acute intervention. Patient verbalized understanding. After Visit Summary given.          Alvino Chapel [place], PA-C 01/20/20 [phone]

## 2020-01-20 NOTE — Discharge Instructions (Signed)
Based on history, and exam I am recommending further evaluation and management in the ED.  Cannot rule out sepsis in Urgent Care.  Patient declines at this time.  Patient and wife aware of risk associated with this decision including delayed treatment, delayed diagnosis, tissue damage, organ damage, organ failure, loss of toe and/or foot, and possibly death.  Patient and wife aware and would like to try outpatient therapy first.    Gram of rocephin given in office Wash site daily with warm water and mild soap Keep covered to avoid friction Take antibiotic as prescribed and to completion Follow up tomorrow for recheck and for another gram of rocephin Go to the ED if you have any new or worsening symptoms increased redness, swelling, pain, nausea, vomiting, fever, chills, etc..Marland Kitchen

## 2020-01-21 ENCOUNTER — Other Ambulatory Visit: Payer: Self-pay

## 2020-01-21 ENCOUNTER — Emergency Department (HOSPITAL_COMMUNITY): Payer: BC Managed Care – PPO

## 2020-01-21 ENCOUNTER — Inpatient Hospital Stay (HOSPITAL_COMMUNITY)
Admission: EM | Admit: 2020-01-21 | Discharge: 2020-01-29 | DRG: 854 | Disposition: A | Discharge status: Home Health | Payer: BC Managed Care – PPO | Attending: Internal Medicine | Admitting: Internal Medicine

## 2020-01-21 ENCOUNTER — Encounter (HOSPITAL_COMMUNITY): Payer: Self-pay | Admitting: Emergency Medicine

## 2020-01-21 DIAGNOSIS — L03116 Cellulitis of left lower limb: Secondary | ICD-10-CM | POA: Diagnosis present

## 2020-01-21 DIAGNOSIS — N179 Acute kidney failure, unspecified: Secondary | ICD-10-CM | POA: Diagnosis present

## 2020-01-21 DIAGNOSIS — N1832 Chronic kidney disease, stage 3b: Secondary | ICD-10-CM | POA: Diagnosis present

## 2020-01-21 DIAGNOSIS — R6520 Severe sepsis without septic shock: Secondary | ICD-10-CM | POA: Diagnosis not present

## 2020-01-21 DIAGNOSIS — I70269 Atherosclerosis of native arteries of extremities with gangrene, unspecified extremity: Secondary | ICD-10-CM | POA: Diagnosis present

## 2020-01-21 DIAGNOSIS — E1140 Type 2 diabetes mellitus with diabetic neuropathy, unspecified: Secondary | ICD-10-CM | POA: Diagnosis present

## 2020-01-21 DIAGNOSIS — Z9842 Cataract extraction status, left eye: Secondary | ICD-10-CM | POA: Diagnosis not present

## 2020-01-21 DIAGNOSIS — D72829 Elevated white blood cell count, unspecified: Secondary | ICD-10-CM | POA: Diagnosis not present

## 2020-01-21 DIAGNOSIS — E1169 Type 2 diabetes mellitus with other specified complication: Secondary | ICD-10-CM | POA: Diagnosis present

## 2020-01-21 DIAGNOSIS — F419 Anxiety disorder, unspecified: Secondary | ICD-10-CM | POA: Diagnosis not present

## 2020-01-21 DIAGNOSIS — E1152 Type 2 diabetes mellitus with diabetic peripheral angiopathy with gangrene: Secondary | ICD-10-CM | POA: Diagnosis present

## 2020-01-21 DIAGNOSIS — E11628 Type 2 diabetes mellitus with other skin complications: Secondary | ICD-10-CM | POA: Diagnosis present

## 2020-01-21 DIAGNOSIS — I739 Peripheral vascular disease, unspecified: Secondary | ICD-10-CM

## 2020-01-21 DIAGNOSIS — Z961 Presence of intraocular lens: Secondary | ICD-10-CM | POA: Diagnosis present

## 2020-01-21 DIAGNOSIS — Z9841 Cataract extraction status, right eye: Secondary | ICD-10-CM | POA: Diagnosis not present

## 2020-01-21 DIAGNOSIS — M19072 Primary osteoarthritis, left ankle and foot: Secondary | ICD-10-CM | POA: Diagnosis not present

## 2020-01-21 DIAGNOSIS — M869 Osteomyelitis, unspecified: Secondary | ICD-10-CM | POA: Diagnosis present

## 2020-01-21 DIAGNOSIS — I96 Gangrene, not elsewhere classified: Secondary | ICD-10-CM | POA: Diagnosis not present

## 2020-01-21 DIAGNOSIS — Z833 Family history of diabetes mellitus: Secondary | ICD-10-CM

## 2020-01-21 DIAGNOSIS — I10 Essential (primary) hypertension: Secondary | ICD-10-CM | POA: Diagnosis not present

## 2020-01-21 DIAGNOSIS — E1122 Type 2 diabetes mellitus with diabetic chronic kidney disease: Secondary | ICD-10-CM | POA: Diagnosis present

## 2020-01-21 DIAGNOSIS — Z87442 Personal history of urinary calculi: Secondary | ICD-10-CM

## 2020-01-21 DIAGNOSIS — Z79899 Other long term (current) drug therapy: Secondary | ICD-10-CM | POA: Diagnosis not present

## 2020-01-21 DIAGNOSIS — M86172 Other acute osteomyelitis, left ankle and foot: Secondary | ICD-10-CM | POA: Diagnosis present

## 2020-01-21 DIAGNOSIS — N183 Chronic kidney disease, stage 3 unspecified: Secondary | ICD-10-CM | POA: Diagnosis present

## 2020-01-21 DIAGNOSIS — E1165 Type 2 diabetes mellitus with hyperglycemia: Secondary | ICD-10-CM | POA: Diagnosis present

## 2020-01-21 DIAGNOSIS — Z7984 Long term (current) use of oral hypoglycemic drugs: Secondary | ICD-10-CM

## 2020-01-21 DIAGNOSIS — A419 Sepsis, unspecified organism: Principal | ICD-10-CM

## 2020-01-21 DIAGNOSIS — I129 Hypertensive chronic kidney disease with stage 1 through stage 4 chronic kidney disease, or unspecified chronic kidney disease: Secondary | ICD-10-CM | POA: Diagnosis present

## 2020-01-21 DIAGNOSIS — N1831 Chronic kidney disease, stage 3a: Secondary | ICD-10-CM | POA: Diagnosis not present

## 2020-01-21 DIAGNOSIS — L0390 Cellulitis, unspecified: Secondary | ICD-10-CM | POA: Diagnosis not present

## 2020-01-21 DIAGNOSIS — E119 Type 2 diabetes mellitus without complications: Secondary | ICD-10-CM

## 2020-01-21 DIAGNOSIS — Z20822 Contact with and (suspected) exposure to covid-19: Secondary | ICD-10-CM | POA: Diagnosis present

## 2020-01-21 DIAGNOSIS — I7389 Other specified peripheral vascular diseases: Secondary | ICD-10-CM | POA: Diagnosis not present

## 2020-01-21 LAB — COMPREHENSIVE METABOLIC PANEL
ALT: 11 U/L (ref 0–44)
AST: 12 U/L — ABNORMAL LOW (ref 15–41)
Albumin: 3.7 g/dL (ref 3.5–5.0)
Alkaline Phosphatase: 63 U/L (ref 38–126)
Anion gap: 12 (ref 5–15)
BUN: 19 mg/dL (ref 8–23)
CO2: 24 mmol/L (ref 22–32)
Calcium: 8.9 mg/dL (ref 8.9–10.3)
Chloride: 98 mmol/L (ref 98–111)
Creatinine, Ser: 1.33 mg/dL — ABNORMAL HIGH (ref 0.61–1.24)
GFR calc Af Amer: >60 mL/min (ref >60)
GFR calc non Af Amer: 56 mL/min — ABNORMAL LOW (ref >60)
Glucose, Bld: 190 mg/dL — ABNORMAL HIGH (ref 70–99)
Potassium: 4.4 mmol/L (ref 3.5–5.1)
Sodium: 134 mmol/L — ABNORMAL LOW (ref 135–145)
Total Bilirubin: 0.7 mg/dL (ref 0.3–1.2)
Total Protein: 7.9 g/dL (ref 6.5–8.1)

## 2020-01-21 LAB — ABO/RH: ABO/RH(D): O NEG

## 2020-01-21 LAB — CBC WITH DIFFERENTIAL/PLATELET
Abs Immature Granulocytes: 0.15 10*3/uL — ABNORMAL HIGH (ref 0.00–0.07)
Basophils Absolute: 0.1 10*3/uL (ref 0.0–0.1)
Basophils Relative: 0 %
Eosinophils Absolute: 0.1 10*3/uL (ref 0.0–0.5)
Eosinophils Relative: 0 %
HCT: 46.9 % (ref 39.0–52.0)
Hemoglobin: 15.2 g/dL (ref 13.0–17.0)
Immature Granulocytes: 1 %
Lymphocytes Relative: 5 %
Lymphs Abs: 1 10*3/uL (ref 0.7–4.0)
MCH: 29.9 pg (ref 26.0–34.0)
MCHC: 32.4 g/dL (ref 30.0–36.0)
MCV: 92.3 fL (ref 80.0–100.0)
Monocytes Absolute: 1.3 10*3/uL — ABNORMAL HIGH (ref 0.1–1.0)
Monocytes Relative: 7 %
Neutro Abs: 16.3 10*3/uL — ABNORMAL HIGH (ref 1.7–7.7)
Neutrophils Relative %: 87 %
Platelets: 232 10*3/uL (ref 150–400)
RBC: 5.08 MIL/uL (ref 4.22–5.81)
RDW: 13.3 % (ref 11.5–15.5)
WBC: 18.8 10*3/uL — ABNORMAL HIGH (ref 4.0–10.5)
nRBC: 0 % (ref 0.0–0.2)

## 2020-01-21 LAB — CBC
HCT: 43.3 % (ref 39.0–52.0)
Hemoglobin: 14.5 g/dL (ref 13.0–17.0)
MCH: 30.8 pg (ref 26.0–34.0)
MCHC: 33.5 g/dL (ref 30.0–36.0)
MCV: 91.9 fL (ref 80.0–100.0)
Platelets: 206 10*3/uL (ref 150–400)
RBC: 4.71 MIL/uL (ref 4.22–5.81)
RDW: 13.4 % (ref 11.5–15.5)
WBC: 16.3 10*3/uL — ABNORMAL HIGH (ref 4.0–10.5)
nRBC: 0 % (ref 0.0–0.2)

## 2020-01-21 LAB — TYPE AND SCREEN
ABO/RH(D): O NEG
Antibody Screen: NEGATIVE

## 2020-01-21 LAB — HEMOGLOBIN A1C
Hgb A1c MFr Bld: 7.1 % — ABNORMAL HIGH (ref 4.8–5.6)
Mean Plasma Glucose: 157.07 mg/dL

## 2020-01-21 LAB — RESPIRATORY PANEL BY RT PCR (FLU A&B, COVID)
Influenza A by PCR: NEGATIVE
Influenza B by PCR: NEGATIVE
SARS Coronavirus 2 by RT PCR: NEGATIVE

## 2020-01-21 LAB — CBG MONITORING, ED
Glucose-Capillary: 173 mg/dL — ABNORMAL HIGH (ref 70–99)
Glucose-Capillary: 129 mg/dL — ABNORMAL HIGH (ref 70–99)
Glucose-Capillary: 105 mg/dL — ABNORMAL HIGH (ref 70–99)
Glucose-Capillary: 116 mg/dL — ABNORMAL HIGH (ref 70–99)

## 2020-01-21 LAB — CREATININE, SERUM
Creatinine, Ser: 1.11 mg/dL (ref 0.61–1.24)
GFR calc Af Amer: >60 mL/min (ref >60)
GFR calc non Af Amer: >60 mL/min (ref >60)

## 2020-01-21 LAB — HIV ANTIBODY (ROUTINE TESTING W REFLEX): HIV Screen 4th Generation wRfx: NONREACTIVE

## 2020-01-21 LAB — LACTIC ACID, PLASMA: Lactic Acid, Venous: 1.1 mmol/L (ref 0.5–1.9)

## 2020-01-21 MED ORDER — VANCOMYCIN HCL 2000 MG/400ML IV SOLN
2000.0000 mg | Freq: Every day | INTRAVENOUS | Status: DC
  Administered 2020-01-22: 2000 mg via INTRAVENOUS
  Filled 2020-01-21: qty 400

## 2020-01-21 MED ORDER — VANCOMYCIN HCL IN DEXTROSE 1-5 GM/200ML-% IV SOLN
1000.0000 mg | Freq: Once | INTRAVENOUS | Status: AC
  Administered 2020-01-21: 1000 mg via INTRAVENOUS
  Filled 2020-01-21: qty 200

## 2020-01-21 MED ORDER — ENOXAPARIN SODIUM 40 MG/0.4ML ~~LOC~~ SOLN
40.0000 mg | SUBCUTANEOUS | Status: DC
  Administered 2020-01-21 – 2020-01-25 (×5): 40 mg via SUBCUTANEOUS
  Filled 2020-01-21 (×5): qty 0.4

## 2020-01-21 MED ORDER — TRAMADOL HCL 50 MG PO TABS
50.0000 mg | ORAL_TABLET | Freq: Three times a day (TID) | ORAL | Status: DC | PRN
  Administered 2020-01-21 – 2020-01-29 (×7): 50 mg via ORAL
  Filled 2020-01-21 (×8): qty 1

## 2020-01-21 MED ORDER — SODIUM CHLORIDE 0.9 % IV BOLUS
1000.0000 mL | Freq: Once | INTRAVENOUS | Status: AC
  Administered 2020-01-21: 1000 mL via INTRAVENOUS

## 2020-01-21 MED ORDER — INSULIN ASPART 100 UNIT/ML ~~LOC~~ SOLN
0.0000 [IU] | Freq: Three times a day (TID) | SUBCUTANEOUS | Status: DC
  Administered 2020-01-22: 3 [IU] via SUBCUTANEOUS
  Administered 2020-01-23 – 2020-01-24 (×2): 2 [IU] via SUBCUTANEOUS
  Administered 2020-01-24: 3 [IU] via SUBCUTANEOUS
  Administered 2020-01-25: 2 [IU] via SUBCUTANEOUS
  Administered 2020-01-25: 3 [IU] via SUBCUTANEOUS
  Administered 2020-01-26 (×2): 2 [IU] via SUBCUTANEOUS
  Administered 2020-01-26 – 2020-01-27 (×2): 3 [IU] via SUBCUTANEOUS
  Administered 2020-01-28 – 2020-01-29 (×3): 2 [IU] via SUBCUTANEOUS
  Administered 2020-01-29: 12:00:00 3 [IU] via SUBCUTANEOUS
  Filled 2020-01-21: qty 0.15

## 2020-01-21 MED ORDER — METOPROLOL TARTRATE 50 MG PO TABS
50.0000 mg | ORAL_TABLET | Freq: Every day | ORAL | Status: DC
  Administered 2020-01-22 – 2020-01-29 (×8): 50 mg via ORAL
  Filled 2020-01-21 (×6): qty 1
  Filled 2020-01-21: qty 2
  Filled 2020-01-21: qty 1

## 2020-01-21 MED ORDER — ONDANSETRON HCL 4 MG/2ML IJ SOLN: 4.0000 mg | Freq: Four times a day (QID) | INTRAMUSCULAR | Status: DC | PRN

## 2020-01-21 MED ORDER — SODIUM CHLORIDE 0.9 % IV SOLN
2.0000 g | INTRAVENOUS | Status: DC
  Administered 2020-01-21 – 2020-01-26 (×5): 2 g via INTRAVENOUS
  Filled 2020-01-21 (×3): qty 20
  Filled 2020-01-21 (×3): qty 2
  Filled 2020-01-21: qty 20

## 2020-01-21 MED ORDER — INSULIN ASPART 100 UNIT/ML ~~LOC~~ SOLN
0.0000 [IU] | Freq: Every day | SUBCUTANEOUS | Status: DC
  Filled 2020-01-21: qty 0.05

## 2020-01-21 MED ORDER — VANCOMYCIN HCL 2000 MG/400ML IV SOLN
2000.0000 mg | Freq: Every day | INTRAVENOUS | Status: DC
  Filled 2020-01-21: qty 400

## 2020-01-21 MED ORDER — SODIUM CHLORIDE 0.9 % IV SOLN: INTRAVENOUS | Status: DC

## 2020-01-21 MED ORDER — ONDANSETRON HCL 4 MG PO TABS: 4.0000 mg | ORAL_TABLET | Freq: Four times a day (QID) | ORAL | Status: DC | PRN

## 2020-01-21 NOTE — H&P (Signed)
History and Physical  KAUSHIK MAUL KGU:[id number] DOB: 25-Nov-1955 DOA: 01/21/2020   PCP: Danella Penton, MD   Patient coming from: Home   Chief Complaint: Left toe infection   HPI: Brent Lam is a 64 y.o. male with medical history significant for noninsulin-dependent type 2 diabetes, hypertension (to the hospital with left toe osteomyelitis.  The patient tells me that for the last month or 2 days he has had an eschar on the left fourth toe, starting about a week ago he started to notice worsening swelling erythema and some pain in the left fourth toe.  Over the last couple of days, redness and swelling has spread onto the dorsum of the left foot, he also has a blister forming on the third left toe.  He went to urgent care yesterday, where he was diagnosed with suspected left toe abscess but refused recommendation to go to ER for evaluation.  He was given a gram of Rocephin in the office and sent home with doxycycline.  He came back for wound check today, it looks worse and further evaluation noted leukocytosis, osteomyelitis on x-ray, elevated lactate, tachycardia.  The patient was given IV vancomycin for his sepsis, and orthopedic surgery was consulted.  They recommended admission on the hospitalist service to [facility], where the patient will be seen by Dr. Lajoyce Corners and further plans likely surgery will be discussed.  Currently the patient is quite comfortable, denies any pain, denies any recent fevers, chills, nausea, vomiting, abdominal pain, chest pain  Review of Systems: Please see HPI for pertinent positives and negatives. A complete 10 system review of systems are otherwise negative.  Past Medical History:  Diagnosis Date  . Diabetes mellitus, type 2 (HCC)    diet controlled  . History of kidney stones    X2 4or 5 yrs ago  . Hypertension    controlled on meds   Past Surgical History:  Procedure Laterality Date  . CATARACT EXTRACTION W/PHACO Left 03/19/2019   Procedure:  CATARACT EXTRACTION PHACO AND INTRAOCULAR LENS PLACEMENT ([facility])  LEFT DIABETIC symfony lens;  Surgeon: Nevada Crane, MD;  Location: [facility];  Service: Ophthalmology;  Laterality: Left;  Diabetic - oral meds  . CATARACT EXTRACTION W/PHACO Right 04/23/2019   Procedure: CATARACT EXTRACTION PHACO AND INTRAOCULAR LENS PLACEMENT ([facility])  RIGHT DIABETIC SYMFONY TORIC LENS;  Surgeon: Nevada Crane, MD;  Location: [facility];  Service: Ophthalmology;  Laterality: Right;  Diabetes-oral med  . ROTATOR CUFF REPAIR  10/2015    Social History:  reports that he has never smoked. He has never used smokeless tobacco. He reports previous alcohol use. He reports that he does not use drugs.   No Known Allergies  Family History  Problem Relation Age of Onset  . Diabetes Mother   . Diabetes Father      Prior to Admission medications   Medication Sig Start Date End Date Taking? Authorizing Provider  acetaminophen (TYLENOL) 500 MG tablet Take 1,000 mg by mouth every 6 (six) hours as needed for moderate pain.   Yes [provider]  Cyanocobalamin (VITAMIN B-12 SL) Place under the tongue once a week.    Yes [provider]  doxycycline (VIBRAMYCIN) 100 MG capsule Take 1 capsule (100 mg total) by mouth 2 (two) times daily. 01/20/20  Yes [place], [place], PA-C  metFORMIN (GLUCOPHAGE) 500 MG tablet Take 500 mg by mouth 2 (two) times daily with a meal.   Yes [provider]  metoprolol  tartrate (LOPRESSOR) 50 MG tablet Take 50 mg by mouth daily. am   Yes [provider]    Physical Exam: BP (!) 168/104   Pulse (!) 112   Temp 98.6 F (37 C) (Oral)   Resp (!) 29   Ht 6' (1.829 m)   Wt 95.3 kg   SpO2 98%   BMI 28.48 kg/m   General:  Alert, oriented, calm, in no acute distress  Eyes: EOMI, clear conjuctivae, white sclerea Neck: supple, no masses, trachea mildline  Cardiovascular: RRR, no murmurs or rubs, no peripheral edema  Respiratory: clear to  auscultation bilaterally, no wheezes, no crackles  Abdomen: soft, nontender, nondistended, normal bowel tones heard  Skin: dry, no rashes; he has an approximately 0.5 cm round wound on the medial aspect of the fourth left toe, with surrounding erythema, swelling.  The entire fourth toe looks ischemic, though is neurovascularly intact currently.  He also has spreading erythema on the dorsum of the left foot.  There is also a clear fluid-filled blister on the left third toe. Musculoskeletal: no joint effusions, normal range of motion  Psychiatric: appropriate affect, normal speech  Neurologic: extraocular muscles intact, clear speech, moving all extremities with intact sensorium           Labs on Admission:  Basic Metabolic Panel: Recent Labs  Lab 01/21/20 1127  NA 134*  K 4.4  CL 98  CO2 24  GLUCOSE 190*  BUN 19  CREATININE 1.33*  CALCIUM 8.9   Liver Function Tests: Recent Labs  Lab 01/21/20 1127  AST 12*  ALT 11  ALKPHOS 63  BILITOT 0.7  PROT 7.9  ALBUMIN 3.7   No results for input(s): LIPASE, AMYLASE in the last 168 hours. No results for input(s): AMMONIA in the last 168 hours. CBC: Recent Labs  Lab 01/21/20 1127  WBC 18.8*  NEUTROABS 16.3*  HGB 15.2  HCT 46.9  MCV 92.3  PLT 232   Cardiac Enzymes: No results for input(s): CKTOTAL, CKMB, CKMBINDEX, TROPONINI in the last 168 hours.  BNP (last 3 results) No results for input(s): BNP in the last 8760 hours.  ProBNP (last 3 results) No results for input(s): PROBNP in the last 8760 hours.  CBG: Recent Labs  Lab 01/21/20 1136 01/21/20 1534  GLUCAP 173* 129*    Radiological Exams on Admission: DG Chest 2 View  Result Date: 01/21/2020 CLINICAL DATA:  Preoperative study. EXAM: CHEST - 2 VIEW COMPARISON:  None. FINDINGS: The heart size and mediastinal contours are within normal limits. Both lungs are clear. The visualized skeletal structures are unremarkable. Unchanged small radiopaque density in the right  anterolateral lower chest wall overlying the costophrenic angle. IMPRESSION: No active cardiopulmonary disease. Electronically Signed   By: Titus Dubin M.D.   On: 01/21/2020 13:24   DG Foot Complete Left  Result Date: 01/21/2020 CLINICAL DATA:  Fourth toe infection. EXAM: LEFT FOOT - COMPLETE 3+ VIEW COMPARISON:  None. FINDINGS: Ulceration along the medial fourth toe at the DIP joint with underlying erosion of the medial fourth middle and distal phalanges. Prominent soft tissue gas in the fourth toe with fourth toe and distal forefoot soft tissue swelling. No acute fracture or dislocation. Mild osteoarthritis of the first MTP joint. Bone mineralization is normal. Large plantar and Achilles enthesophytes. Vascular calcifications. IMPRESSION: 1. Ulceration along the medial fourth toe with underlying osteomyelitis of the fourth middle and distal phalanges and gangrenous soft tissue infection of the fourth toe. Electronically Signed   By: Huntley Dec  Derry M.D.   On: 01/21/2020 13:29   Assessment/Plan Present on Admission: . CKD (chronic kidney disease) stage 3, GFR 30-59 ml/min . HTN (hypertension) . Leukocytosis . Sepsis (HCC) . Toe osteomyelitis, left (HCC) . Osteoarthritis of toe joint, left  This is a 64 year old gentleman with diabetic foot infection and osteomyelitis of the left fourth toe and resulting sepsis.  Sepsis-meeting criteria with leukocytosis, tachycardia, source is the left great toe infection with osteomyelitis.  Lactate 1.1.  Received 1 L fluid bolus in the emergency department, tachycardia improved. -Inpatient admission to [facility] at the request of orthopedic surgery Dr. Lajoyce Corners -Empiric IV vancomycin and IV Rocephin -Blood cultures have been obtained and will be followed -Tylenol and tramadol as needed pain  Type 2 diabetes-check hemoglobin A1c, diabetic diet when eating, and sliding scale insulin  Hypertension-continue home Lopressor  DVT prophylaxis: Lovenox  and SCDs  Code Status: Full code  Family Communication: Discussed thoroughly with patient, as well as close family friend who was in the ER this afternoon.  Disposition Plan: Likely discharge home when medically stable.  Consults called: Orthopedic surgery.  Admission status: Inpatient  Time spent: 42 minutes  Jourdan Maldonado Vergie Living MD [facility] Pager [phone]  If 7PM-7AM, please contact night-coverage [email or web address] Password [facility]  01/21/2020, 3:40 PM

## 2020-01-21 NOTE — Progress Notes (Signed)
Pharmacy Antibiotic Note  Brent Lam is a 64 y.o. male admitted on 01/21/2020 with osteomyelitis of L toe.  Pharmacy has been consulted for vancomycin dosing.  Plan:  Vancomycin 1000 + 1000 mg IV now, then 2000 mg IV q24 hr (est AUC 519 based on SCr 1.33; Vd 0.72)  Measure vancomycin AUC at steady state as indicated  SCr q48 while on vanc  Rocephin per MD; dosing appropriate  Height: 6' (182.9 cm) Weight: 95.3 kg (210 lb) IBW/kg (Calculated) : 77.6  Temp (24hrs), Avg:98.7 F (37.1 C), Min:98.2 F (36.8 C), Max:99.4 F (37.4 C)  Recent Labs  Lab 01/21/20 1126 01/21/20 1127  WBC  --  18.8*  CREATININE  --  1.33*  LATICACIDVEN 1.1  --     Estimated Creatinine Clearance: 68.1 mL/min (A) (by C-G formula based on SCr of 1.33 mg/dL (H)).    No Known Allergies    Thank you for allowing pharmacy to be a part of this patient's care.  Ha Placeres A 01/21/2020 4:06 PM

## 2020-01-21 NOTE — ED Triage Notes (Signed)
Per pt, states he had a corn on his left toe, next o little toe, fall off over a week ago-now toe is bruised and foot is swollen, inflamed, red-states he went to UC this am and they gave him a shot and sent him here

## 2020-01-21 NOTE — ED Provider Notes (Signed)
[facility]-EMERGENCY DEPT Provider Note   CSN: [id number] Arrival date & time: 01/21/20  0941     History Chief Complaint  Patient presents with  . Foot Pain    Brent Lam is a 64 y.o. male with PMHx diabetes currently on Metformin who presents to the ED today complaining of wound to left 4th toe x 1 week.  She reports the area started out with a small callus that fell off.  He states that an ulcer is gradually been increasing in size since then.  He reports pain mostly present when his legs are in the dependent position; less pain with elevation.  Went to urgent care yesterday and was advised to come to the ED immediately however wanted to attempt outpatient therapy.  He was given a gram of Rocephin urgent care and discharged home with doxycycline.  Reports that he has had a total of 3 doses of doxycycline.  He does mention that he had a fever of 101.3 last night however he took Tylenol with relief of his fever.  He does mention that today while removing his pants he noticed a red streak going up his left leg.   The history is provided by the patient, the spouse and medical records.       Past Medical History:  Diagnosis Date  . Diabetes mellitus, type 2 (HCC)    diet controlled  . History of kidney stones    X2 4or 5 yrs ago  . Hypertension    controlled on meds    There are no problems to display for this patient.   Past Surgical History:  Procedure Laterality Date  . CATARACT EXTRACTION W/PHACO Left 03/19/2019   Procedure: CATARACT EXTRACTION PHACO AND INTRAOCULAR LENS PLACEMENT (IOC)  LEFT DIABETIC symfony lens;  Surgeon: Nevada Crane, MD;  Location: [facility];  Service: Ophthalmology;  Laterality: Left;  Diabetic - oral meds  . CATARACT EXTRACTION W/PHACO Right 04/23/2019   Procedure: CATARACT EXTRACTION PHACO AND INTRAOCULAR LENS PLACEMENT (IOC)  RIGHT DIABETIC SYMFONY TORIC LENS;  Surgeon: Nevada Crane, MD;  Location: [facility];  Service: Ophthalmology;  Laterality: Right;  Diabetes-oral med  . ROTATOR CUFF REPAIR  10/2015       Family History  Problem Relation Age of Onset  . Diabetes Mother   . Diabetes Father     Social History   Tobacco Use  . Smoking status: Never Smoker  . Smokeless tobacco: Never Used  Substance Use Topics  . Alcohol use: Not Currently  . Drug use: Never    Home Medications Prior to Admission medications   Medication Sig Start Date End Date Taking? Authorizing Provider  Cyanocobalamin (VITAMIN B-12 SL) Place under the tongue once a week.     [provider]  doxycycline (VIBRAMYCIN) 100 MG capsule Take 1 capsule (100 mg total) by mouth 2 (two) times daily. 01/20/20   Wurst, [place], PA-C  metFORMIN (GLUCOPHAGE) 500 MG tablet Take 500 mg by mouth 2 (two) times daily with a meal.    [provider]  metoprolol tartrate (LOPRESSOR) 50 MG tablet Take 50 mg by mouth daily. am    [provider]    Allergies    Patient has no known allergies.  Review of Systems   Review of Systems  Constitutional: Positive for fever. Negative for chills.  Musculoskeletal: Positive for arthralgias.  Skin: Positive for color change and wound.  All other systems reviewed and are negative.  Physical Exam Updated Vital Signs BP (!) 153/80 (BP Location: Left Arm)   Pulse 95   Temp 98.2 F (36.8 C) (Oral)   Resp 20   Ht 6' (1.829 m)   Wt 95.3 kg   SpO2 97%   BMI 28.48 kg/m   Physical Exam Vitals and nursing note reviewed.  Constitutional:      Appearance: He is not ill-appearing or diaphoretic.  HENT:     Head: Normocephalic and atraumatic.  Eyes:     Conjunctiva/sclera: Conjunctivae normal.  Cardiovascular:     Rate and Rhythm: Normal rate and regular rhythm.     Pulses: Normal pulses.  Pulmonary:     Effort: Pulmonary effort is normal.     Breath sounds: Normal breath sounds. No wheezing, rhonchi or rales.  Abdominal:     Palpations:  Abdomen is soft.     Tenderness: There is no abdominal tenderness.  Musculoskeletal:     Cervical back: Neck supple.     Comments: See photo below. Stage IV ulcer noted to the medial aspect of L 4th toe with surrounding purple/black color change to toe; toe is cold to the touch. Erythema noted to the dorsum of the left foot (surgical pen mark yesterday with extension past the line) as well as red streaking along medial aspect of LLE. Remainder of foot is warm to the touch with good cap refill; palpable pulses.   Skin:    General: Skin is warm and dry.  Neurological:     Mental Status: He is alert.            ED Results / Procedures / Treatments   Labs (all labs ordered are listed, but only abnormal results are displayed) Labs Reviewed  COMPREHENSIVE METABOLIC PANEL - Abnormal; Notable for the following components:      Result Value   Sodium 134 (*)    Glucose, Bld 190 (*)    Creatinine, Ser 1.33 (*)    AST 12 (*)    GFR calc non Af Amer 56 (*)    All other components within normal limits  CBC WITH DIFFERENTIAL/PLATELET - Abnormal; Notable for the following components:   WBC 18.8 (*)    Neutro Abs 16.3 (*)    Monocytes Absolute 1.3 (*)    Abs Immature Granulocytes 0.15 (*)    All other components within normal limits  CBG MONITORING, ED - Abnormal; Notable for the following components:   Glucose-Capillary 173 (*)    All other components within normal limits  RESPIRATORY PANEL BY RT PCR (FLU A&B, COVID)  CULTURE, BLOOD (ROUTINE X 2)  CULTURE, BLOOD (ROUTINE X 2)  LACTIC ACID, PLASMA  LACTIC ACID, PLASMA  CBG MONITORING, ED  TYPE AND SCREEN  ABO/RH    EKG None  Radiology DG Chest 2 View  Result Date: 01/21/2020 CLINICAL DATA:  Preoperative study. EXAM: CHEST - 2 VIEW COMPARISON:  None. FINDINGS: The heart size and mediastinal contours are within normal limits. Both lungs are clear. The visualized skeletal structures are unremarkable. Unchanged small radiopaque  density in the right anterolateral lower chest wall overlying the costophrenic angle. IMPRESSION: No active cardiopulmonary disease. Electronically Signed   By: Obie Dredge M.D.   On: 01/21/2020 13:24   DG Foot Complete Left  Result Date: 01/21/2020 CLINICAL DATA:  Fourth toe infection. EXAM: LEFT FOOT - COMPLETE 3+ VIEW COMPARISON:  None. FINDINGS: Ulceration along the medial fourth toe at the DIP joint with underlying erosion of the medial fourth middle  and distal phalanges. Prominent soft tissue gas in the fourth toe with fourth toe and distal forefoot soft tissue swelling. No acute fracture or dislocation. Mild osteoarthritis of the first MTP joint. Bone mineralization is normal. Large plantar and Achilles enthesophytes. Vascular calcifications. IMPRESSION: 1. Ulceration along the medial fourth toe with underlying osteomyelitis of the fourth middle and distal phalanges and gangrenous soft tissue infection of the fourth toe. Electronically Signed   By: Titus Dubin M.D.   On: 01/21/2020 13:29    Procedures .Critical Care Performed by: Eustaquio Maize, PA-C Authorized by: Eustaquio Maize, PA-C   Critical care provider statement:    Critical care time (minutes):  45   Critical care was necessary to treat or prevent imminent or life-threatening deterioration of the following conditions:  Sepsis   Critical care was time spent personally by me on the following activities:  Discussions with consultants, evaluation of patient's response to treatment, examination of patient, ordering and performing treatments and interventions, ordering and review of laboratory studies, ordering and review of radiographic studies, pulse oximetry, re-evaluation of patient's condition, obtaining history from patient or surrogate and review of old charts   (including critical care time)  Medications Ordered in ED Medications  sodium chloride 0.9 % bolus 1,000 mL (has no administration in time range)  vancomycin  (VANCOCIN) IVPB 1000 mg/200 mL premix (0 mg Intravenous Stopped 01/21/20 1338)    ED Course  I have reviewed the triage vital signs and the nursing notes.  Pertinent labs & imaging results that were available during my care of the patient were reviewed by me and considered in my medical decision making (see chart for details).  Clinical Course as of Jan 21 1507  Mon Jan 21, 2020  1203 WBC(!): 18.8 [MV]  1348 Discussed case with Dr. Renaee Munda who will come evaluate patient for plan to transfer to [facility]    [MV]  1439 Discussed case with Dr. Alvan Dame; recommends medicine admission to [facility] and Dr. Sharol Given will see patient either tonight or tomorrow to discuss amputation   [MV]    Clinical Course User Index [MV] Eustaquio Maize, PA-C   MDM Rules/Calculators/A&P                      64 year old male who presents to the ED today complaining of ulcer and pain to his left fourth toe x1 week.  Was seen at urgent care yesterday and strongly encouraged to come to the ED with concern for sepsis however patient wanted to manage this outpatient.  Given a dose of Rocephin and discharged home with Doxy.  Denting today for further evaluation.  No discharge streaking going up his left lower extremity today.  He does report he had a fever of 101.3 last night.  On arrival to the ED patient's temp 99.4, tachycardic 116.  Photos above.  Patient has necrotic looking fourth toe with erythema to the dorsum of the foot as well as lymphangitis spreading up the left lower extremity.  Work-up for sepsis at this time.  Will start on Vanco suspected source of infection osteomyelitis. Will get xrays.   CBC with leukocytosis 18.8, currently on vanc. Blood cx have been obtained.  CMP with glucose 190. Creatinine 1.33 which appears to be around pt's baseline Lactic 1.1   Xray with findings of osteo to 4th toe middle and proximal phalanx. Will consult ortho at this time.   Dr. Sharol [facility] to [facility]; recommends transfer with plan  for amputation.  Dr. Erenest Blank to see patient for transfer to [facility] under medicine admission.   This note was prepared using Dragon voice recognition software and may include unintentional dictation errors due to the inherent limitations of voice recognition software.   Final Clinical Impression(s) / ED Diagnoses Final diagnoses:  Sepsis, due to unspecified organism, unspecified whether acute organ dysfunction present [facility])  Osteomyelitis of toe of left foot [facility])    Rx / DC Orders ED Discharge Orders    None       Tanda Rockers, PA-C 01/21/20 1508    Jacalyn Lefevre, MD 01/21/20 1521

## 2020-01-22 ENCOUNTER — Other Ambulatory Visit: Payer: Self-pay

## 2020-01-22 DIAGNOSIS — A419 Sepsis, unspecified organism: Principal | ICD-10-CM

## 2020-01-22 LAB — BASIC METABOLIC PANEL
Anion gap: 11 (ref 5–15)
BUN: 16 mg/dL (ref 8–23)
CO2: 22 mmol/L (ref 22–32)
Calcium: 8.3 mg/dL — ABNORMAL LOW (ref 8.9–10.3)
Chloride: 102 mmol/L (ref 98–111)
Creatinine, Ser: 1.1 mg/dL (ref 0.61–1.24)
GFR calc Af Amer: >60 mL/min (ref >60)
GFR calc non Af Amer: >60 mL/min (ref >60)
Glucose, Bld: 105 mg/dL — ABNORMAL HIGH (ref 70–99)
Potassium: 3.7 mmol/L (ref 3.5–5.1)
Sodium: 135 mmol/L (ref 135–145)

## 2020-01-22 LAB — CBC
HCT: 40.4 % (ref 39.0–52.0)
Hemoglobin: 13.3 g/dL (ref 13.0–17.0)
MCH: 29.8 pg (ref 26.0–34.0)
MCHC: 32.9 g/dL (ref 30.0–36.0)
MCV: 90.6 fL (ref 80.0–100.0)
Platelets: 210 10*3/uL (ref 150–400)
RBC: 4.46 MIL/uL (ref 4.22–5.81)
RDW: 13.3 % (ref 11.5–15.5)
WBC: 13.1 10*3/uL — ABNORMAL HIGH (ref 4.0–10.5)
nRBC: 0 % (ref 0.0–0.2)

## 2020-01-22 LAB — CBG MONITORING, ED
Glucose-Capillary: 113 mg/dL — ABNORMAL HIGH (ref 70–99)
Glucose-Capillary: 107 mg/dL — ABNORMAL HIGH (ref 70–99)
Glucose-Capillary: 154 mg/dL — ABNORMAL HIGH (ref 70–99)
Glucose-Capillary: 110 mg/dL — ABNORMAL HIGH (ref 70–99)

## 2020-01-22 LAB — GLUCOSE, CAPILLARY: Glucose-Capillary: 92 mg/dL (ref 70–99)

## 2020-01-22 MED ORDER — LORAZEPAM 2 MG/ML IJ SOLN
1.0000 mg | Freq: Once | INTRAMUSCULAR | Status: AC
  Administered 2020-01-22: 1 mg via INTRAVENOUS
  Filled 2020-01-22: qty 1

## 2020-01-22 MED ORDER — VANCOMYCIN HCL 1250 MG/250ML IV SOLN
1250.0000 mg | Freq: Two times a day (BID) | INTRAVENOUS | Status: DC
  Administered 2020-01-23 (×2): 1250 mg via INTRAVENOUS
  Filled 2020-01-22 (×3): qty 250

## 2020-01-22 MED ORDER — HYDRALAZINE HCL 20 MG/ML IJ SOLN
10.0000 mg | Freq: Once | INTRAMUSCULAR | Status: AC
  Administered 2020-01-22: 10 mg via INTRAVENOUS
  Filled 2020-01-22: qty 1

## 2020-01-22 NOTE — Progress Notes (Signed)
Pharmacy Antibiotic Note  Brent Lam is a 64 y.o. male admitted on 01/21/2020 with osteomyelitis of L toe.  Pharmacy has been consulted for vancomycin dosing; Ortho to see.  Today, 01/22/2020:  SCr improved to ~baseline  WBC elevated but improving  Remains afebrile  Plan for Txfer to [facility] for Ortho w/o per Dr. Lajoyce Corners  Plan:  Increase vancomycin to 1250 mg IV q12 with improved renal function (new AUC 545 w/ SCr 1.1; Vd 0.72)  Measure vancomycin AUC at steady state as indicated  SCr q48 while on vanc  Rocephin per MD; dosing appropriate  Height: 6' (182.9 cm) Weight: 95.3 kg (210 lb) IBW/kg (Calculated) : 77.6  Temp (24hrs), Avg:98.6 F (37 C), Min:98.6 F (37 C), Max:98.6 F (37 C)  Recent Labs  Lab 01/21/20 1126 01/21/20 1127 01/21/20 1619 01/22/20 0435  WBC  --  18.8* 16.3* 13.1*  CREATININE  --  1.33* 1.11 1.10  LATICACIDVEN 1.1  --   --   --     Estimated Creatinine Clearance: 82.3 mL/min (by C-G formula based on SCr of 1.1 mg/dL).    No Known Allergies   Dose adjustments this admission: 4/20: incr vanc to 1250 q12 hr with improved renal function  Microbiology results: 4/19 BCx: ngtd   Thank you for allowing pharmacy to be a part of this patient's care.  Brent Lam A 01/22/2020 1:37 PM

## 2020-01-22 NOTE — Progress Notes (Signed)
PROGRESS NOTE    Brent Lam  GGY:[id number] DOB: October 26, 1955 DOA: 01/21/2020 PCP: Brent Aus, MD  Brief Narrative:Brent Lam is a 64 y.o. male with medical history significant for noninsulin-dependent type 2 diabetes, hypertension . -Presented to the ED with swelling pain redness and discoloration of third, fourth toes on his left foot , started approximately a week ago, noticed worsening swelling and erythema spreading proximally up his foot  -Work-up in the ED was notable for leukocytosis and x-ray concerning for osteomyelitis of the fourth toe   Assessment & Plan:   Diabetic foot infection Osteomyelitis fourth toe of left foot Sepsis -Continue IV fluids, empiric IV vancomycin and ceftriaxone -Patient still at [facility] emergency room awaiting transfer to [facility] for orthopedic evaluation by Brent Lam and likely surgery -Follow-up blood cultures -Supportive care, pain control  Type 2 diabetes mellitus -Metformin on hold, hemoglobin A1c is 7.1 -Continue sliding scale insulin  Hypertension -Continue home Lopressor  Mild AKI -Likely in the setting of sepsis, now resolved  DVT prophylaxis: Lovenox Code Status: Full code Family Communication: Discussed with patient in detail, no family at bedside Disposition Plan:  Status is: Inpatient  Remains inpatient appropriate because:IV treatments appropriate due to intensity of illness or inability to take PO   Dispo: The patient is from: Home              Anticipated d/c is to: Home              Anticipated d/c date is: 3 days              Patient currently is not medically stable to d/c.  Consultants:   Ortho Brent Lam   Procedures:   Antimicrobials:    Subjective: -Improving, swelling redness and pain in his left foot is improving since starting on antibiotics  Objective: Vitals:   01/22/20 1130 01/22/20 1200 01/22/20 1230 01/22/20 1300  BP: 123/70 140/71 (!) 147/80 (!) 154/83  Pulse: 78 80 82 79   Resp: 20 20 20 19   Temp:      TempSrc:      SpO2: 94% 92% 96% 95%  Weight:      Height:        Intake/Output Summary (Last 24 hours) at 01/22/2020 1442 Last data filed at 01/22/2020 0350 Gross per 24 hour  Intake 1322.16 ml  Output 950 ml  Net 372.16 ml   Filed Weights   01/21/20 1133  Weight: 95.3 kg    Examination:  General exam: Appears calm and comfortable, AAOx3, no distress Respiratory system: Clear to auscultation Cardiovascular system: S1 & S2 heard, RRR Gastrointestinal system: Abdomen is nondistended, soft and nontender.Normal bowel sounds heard. Central nervous system: Moves all extremities, no localizing signs Extremities: Left foot with dressing, dark, purplish discoloration of the fourth toe, erythema tenderness of the dorsal forefoot Skin: As above Psychiatry: Judgement and insight appear normal. Mood & affect appropriate.     Data Reviewed:   CBC: Recent Labs  Lab 01/21/20 1127 01/21/20 1619 01/22/20 0435  WBC 18.8* 16.3* 13.1*  NEUTROABS 16.3*  --   --   HGB 15.2 14.5 13.3  HCT 46.9 43.3 40.4  MCV 92.3 91.9 90.6  PLT 232 206 093   Basic Metabolic Panel: Recent Labs  Lab 01/21/20 1127 01/21/20 1619 01/22/20 0435  NA 134*  --  135  K 4.4  --  3.7  CL 98  --  102  CO2 24  --  22  GLUCOSE 190*  --  105*  BUN 19  --  16  CREATININE 1.33* 1.11 1.10  CALCIUM 8.9  --  8.3*   GFR: Estimated Creatinine Clearance: 82.3 mL/min (by C-G formula based on SCr of 1.1 mg/dL). Liver Function Tests: Recent Labs  Lab 01/21/20 1127  AST 12*  ALT 11  ALKPHOS 63  BILITOT 0.7  PROT 7.9  ALBUMIN 3.7   No results for input(s): LIPASE, AMYLASE in the last 168 hours. No results for input(s): AMMONIA in the last 168 hours. Coagulation Profile: No results for input(s): INR, PROTIME in the last 168 hours. Cardiac Enzymes: No results for input(s): CKTOTAL, CKMB, CKMBINDEX, TROPONINI in the last 168 hours. BNP (last 3 results) No results for input(s):  PROBNP in the last 8760 hours. HbA1C: Recent Labs    01/21/20 1127  HGBA1C 7.1*   CBG: Recent Labs  Lab 01/21/20 2316 01/22/20 0708 01/22/20 0809 01/22/20 1103 01/22/20 1233  GLUCAP 116* 113* 107* 154* 110*   Lipid Profile: No results for input(s): CHOL, HDL, LDLCALC, TRIG, CHOLHDL, LDLDIRECT in the last 72 hours. Thyroid Function Tests: No results for input(s): TSH, T4TOTAL, FREET4, T3FREE, THYROIDAB in the last 72 hours. Anemia Panel: No results for input(s): VITAMINB12, FOLATE, FERRITIN, TIBC, IRON, RETICCTPCT in the last 72 hours. Urine analysis: No results found for: COLORURINE, APPEARANCEUR, LABSPEC, PHURINE, GLUCOSEU, HGBUR, BILIRUBINUR, KETONESUR, PROTEINUR, UROBILINOGEN, NITRITE, LEUKOCYTESUR Sepsis Labs: @LABRCNTIP (procalcitonin:4,lacticidven:4)  ) Recent Results (from the past 240 hour(s))  Culture, blood (routine x 2)     Status: None (Preliminary result)   Collection Time: 01/21/20 12:20 PM   Specimen: BLOOD  Result Value Ref Range Status   Specimen Description   Final    BLOOD RIGHT ANTECUBITAL Performed at [facility], [address] [place], [place]    Special Requests   Final    BOTTLES DRAWN AEROBIC AND ANAEROBIC Blood Culture results may not be optimal due to an excessive volume of blood received in culture bottles Performed at [facility], [address], [place], [place]    Culture   Final    NO GROWTH < 24 HOURS Performed at [facility], [address], [place], [place]    Report Status PENDING  Incomplete  Culture, blood (routine x 2)     Status: None (Preliminary result)   Collection Time: 01/21/20 12:33 PM   Specimen: BLOOD RIGHT HAND  Result Value Ref Range Status   Specimen Description   Final    BLOOD RIGHT HAND Performed at [facility], [address] [place], [place]    Special Requests   Final    BOTTLES DRAWN AEROBIC AND  ANAEROBIC Blood Culture adequate volume Performed at [facility], [address] [place], [place]    Culture   Final    NO GROWTH < 24 HOURS Performed at [facility], [address], [place], [place]    Report Status PENDING  Incomplete  Respiratory Panel by RT PCR (Flu A&B, Covid) - Nasopharyngeal Swab     Status: None   Collection Time: 01/21/20 12:33 PM   Specimen: Nasopharyngeal Swab  Result Value Ref Range Status   SARS Coronavirus 2 by RT PCR NEGATIVE NEGATIVE Final    Comment: (NOTE) SARS-CoV-2 target nucleic acids are NOT DETECTED. The SARS-CoV-2 RNA is generally detectable in upper respiratoy specimens during the acute phase of infection. The lowest concentration  of SARS-CoV-2 viral copies this assay can detect is 131 copies/mL. A negative result does not preclude SARS-Cov-2 infection and should not be used as the sole basis for treatment or other patient management decisions. A negative result may occur with  improper specimen collection/handling, submission of specimen other than nasopharyngeal swab, presence of viral mutation(s) within the areas targeted by this assay, and inadequate number of viral copies (<131 copies/mL). A negative result must be combined with clinical observations, patient history, and epidemiological information. The expected result is Negative. Fact Sheet for Patients:  https://www.moore.com/ Fact Sheet for Healthcare Providers:  https://www.young.biz/ This test is not yet ap proved or cleared by the [place] FDA and  has been authorized for detection and/or diagnosis of SARS-CoV-2 by FDA under an Emergency Use Authorization (EUA). This EUA will remain  in effect (meaning this test can be used) for the duration of the COVID-19 declaration under Section 564(b)(1) of the Act, 21 U.S.C. section 360bbb-3(b)(1), unless the authorization is terminated or revoked  sooner.    Influenza A by PCR NEGATIVE NEGATIVE Final   Influenza B by PCR NEGATIVE NEGATIVE Final    Comment: (NOTE) The Xpert Xpress SARS-CoV-2/FLU/RSV assay is intended as an aid in  the diagnosis of influenza from Nasopharyngeal swab specimens and  should not be used as a sole basis for treatment. Nasal washings and  aspirates are unacceptable for Xpert Xpress SARS-CoV-2/FLU/RSV  testing. Fact Sheet for Patients: https://www.moore.com/ Fact Sheet for Healthcare Providers: https://www.young.biz/ This test is not yet approved or cleared by the [place] FDA and  has been authorized for detection and/or diagnosis of SARS-CoV-2 by  FDA under an Emergency Use Authorization (EUA). This EUA will remain  in effect (meaning this test can be used) for the duration of the  Covid-19 declaration under Section 564(b)(1) of the Act, 21  U.S.C. section 360bbb-3(b)(1), unless the authorization is  terminated or revoked. Performed at [facility], [address], [place], [place] [address]          Radiology Studies: DG Chest 2 View  Result Date: 01/21/2020 CLINICAL DATA:  Preoperative study. EXAM: CHEST - 2 VIEW COMPARISON:  None. FINDINGS: The heart size and mediastinal contours are within normal limits. Both lungs are clear. The visualized skeletal structures are unremarkable. Unchanged small radiopaque density in the right anterolateral lower chest wall overlying the costophrenic angle. IMPRESSION: No active cardiopulmonary disease. Electronically Signed   By: Obie Dredge M.D.   On: 01/21/2020 13:24   DG Foot Complete Left  Result Date: 01/21/2020 CLINICAL DATA:  Fourth toe infection. EXAM: LEFT FOOT - COMPLETE 3+ VIEW COMPARISON:  None. FINDINGS: Ulceration along the medial fourth toe at the DIP joint with underlying erosion of the medial fourth middle and distal phalanges. Prominent soft tissue gas in the fourth toe with  fourth toe and distal forefoot soft tissue swelling. No acute fracture or dislocation. Mild osteoarthritis of the first MTP joint. Bone mineralization is normal. Large plantar and Achilles enthesophytes. Vascular calcifications. IMPRESSION: 1. Ulceration along the medial fourth toe with underlying osteomyelitis of the fourth middle and distal phalanges and gangrenous soft tissue infection of the fourth toe. Electronically Signed   By: Obie Dredge M.D.   On: 01/21/2020 13:29    Scheduled Meds: . enoxaparin (LOVENOX) injection  40 mg Subcutaneous Q24H  . insulin aspart  0-15 Units Subcutaneous TID WC  . insulin aspart  0-5 Units Subcutaneous QHS  . metoprolol tartrate  50 mg Oral Daily  Continuous Infusions: . sodium chloride 75 mL/hr at 01/22/20 0922  . cefTRIAXone (ROCEPHIN)  IV Stopped (01/22/20 0251)  . [START ON 01/23/2020] vancomycin       LOS: 1 day    Time spent: Zannie Cove, MD [facility]  01/22/2020, 2:42 PM

## 2020-01-22 NOTE — Plan of Care (Signed)

## 2020-01-22 NOTE — ED Notes (Signed)
This Clinical research associate had a lengthy conversation with Pt and his wife.  Both voiced concerns about foot getting worse and reported they have not spoken to a doctor today.  Secure message sent to Ortho MD and Hospitalist.  Charge RN, Primary RN, and Flow coordinator were all copied on the message.

## 2020-01-22 NOTE — ED Notes (Signed)
[facility] called for transport. 

## 2020-01-22 NOTE — Consult Note (Signed)
ORTHOPAEDIC CONSULTATION  REQUESTING PHYSICIAN: Zannie Cove, MD  Chief Complaint: Black gangrenous changes left foot fourth toe.  HPI: Brent Lam is a 64 y.o. male who presents with diabetic insensate neuropathy with acute gangrenous changes left foot fourth toe.  Initial pictures obtained in the emergency room 2 days ago shows gangrenous changes involving only the fourth toe.  Evaluation today shows more advanced gangrenous changes.  Has type 2 diabetes and kidney disease.  Patient denies a history of tobacco use.  He does not if he has high cholesterol but does take medication for hypertension.  Patient states that his father had carotid endarterectomies.  Past Medical History:  Diagnosis Date  . Diabetes mellitus, type 2 (HCC)    diet controlled  . History of kidney stones    X2 4or 5 yrs ago  . Hypertension    controlled on meds   Past Surgical History:  Procedure Laterality Date  . CATARACT EXTRACTION W/PHACO Left 03/19/2019   Procedure: CATARACT EXTRACTION PHACO AND INTRAOCULAR LENS PLACEMENT (IOC)  LEFT DIABETIC symfony lens;  Surgeon: Nevada Crane, MD;  Location: [facility];  Service: Ophthalmology;  Laterality: Left;  Diabetic - oral meds  . CATARACT EXTRACTION W/PHACO Right 04/23/2019   Procedure: CATARACT EXTRACTION PHACO AND INTRAOCULAR LENS PLACEMENT (IOC)  RIGHT DIABETIC SYMFONY TORIC LENS;  Surgeon: Nevada Crane, MD;  Location: [facility];  Service: Ophthalmology;  Laterality: Right;  Diabetes-oral med  . ROTATOR CUFF REPAIR  10/2015   Social History   Socioeconomic History  . Marital status: Legally Separated    Spouse name: Not on file  . Number of children: Not on file  . Years of education: Not on file  . Highest education level: Not on file  Occupational History  . Not on file  Tobacco Use  . Smoking status: Never Smoker  . Smokeless tobacco: Never Used  Substance and Sexual Activity  . Alcohol use: Not Currently   . Drug use: Never  . Sexual activity: Not on file  Other Topics Concern  . Not on file  Social History Narrative  . Not on file   Social Determinants of Health   Financial Resource Strain:   . Difficulty of Paying Living Expenses:   Food Insecurity:   . Worried About Programme researcher, broadcasting/film/video in the Last Year:   . Barista in the Last Year:   Transportation Needs:   . Freight forwarder (Medical):   Marland Kitchen Lack of Transportation (Non-Medical):   Physical Activity:   . Days of Exercise per Week:   . Minutes of Exercise per Session:   Stress:   . Feeling of Stress :   Social Connections:   . Frequency of Communication with Friends and Family:   . Frequency of Social Gatherings with Friends and Family:   . Attends Religious Services:   . Active Member of Clubs or Organizations:   . Attends Banker Meetings:   Marland Kitchen Marital Status:    Family History  Problem Relation Age of Onset  . Diabetes Mother   . Diabetes Father    - negative except otherwise stated in the family history section No Known Allergies Prior to Admission medications   Medication Sig Start Date End Date Taking? Authorizing Provider  acetaminophen (TYLENOL) 500 MG tablet Take 1,000 mg by mouth every 6 (six) hours as needed for moderate pain.   Yes [provider]  Cyanocobalamin (VITAMIN B-12 SL) Place  under the tongue once a week.    Yes [provider]  doxycycline (VIBRAMYCIN) 100 MG capsule Take 1 capsule (100 mg total) by mouth 2 (two) times daily. 01/20/20  Yes Wurst, [place], PA-C  metFORMIN (GLUCOPHAGE) 500 MG tablet Take 500 mg by mouth 2 (two) times daily with a meal.   Yes [provider]  metoprolol tartrate (LOPRESSOR) 50 MG tablet Take 50 mg by mouth daily. am   Yes [provider]   DG Chest 2 View  Result Date: 01/21/2020 CLINICAL DATA:  Preoperative study. EXAM: CHEST - 2 VIEW COMPARISON:  None. FINDINGS: The heart size and mediastinal contours  are within normal limits. Both lungs are clear. The visualized skeletal structures are unremarkable. Unchanged small radiopaque density in the right anterolateral lower chest wall overlying the costophrenic angle. IMPRESSION: No active cardiopulmonary disease. Electronically Signed   By: Obie Dredge M.D.   On: 01/21/2020 13:24   DG Foot Complete Left  Result Date: 01/21/2020 CLINICAL DATA:  Fourth toe infection. EXAM: LEFT FOOT - COMPLETE 3+ VIEW COMPARISON:  None. FINDINGS: Ulceration along the medial fourth toe at the DIP joint with underlying erosion of the medial fourth middle and distal phalanges. Prominent soft tissue gas in the fourth toe with fourth toe and distal forefoot soft tissue swelling. No acute fracture or dislocation. Mild osteoarthritis of the first MTP joint. Bone mineralization is normal. Large plantar and Achilles enthesophytes. Vascular calcifications. IMPRESSION: 1. Ulceration along the medial fourth toe with underlying osteomyelitis of the fourth middle and distal phalanges and gangrenous soft tissue infection of the fourth toe. Electronically Signed   By: Obie Dredge M.D.   On: 01/21/2020 13:29   - pertinent xrays, CT, MRI studies were reviewed and independently interpreted  Positive ROS: All other systems have been reviewed and were otherwise negative with the exception of those mentioned in the HPI and as above.  Physical Exam: General: Alert, no acute distress Psychiatric: Patient is competent for consent with normal mood and affect Lymphatic: No axillary or cervical lymphadenopathy Cardiovascular: No pedal edema Respiratory: No cyanosis, no use of accessory musculature GI: No organomegaly, abdomen is soft and non-tender    Images:  @ENCIMAGES @  Labs:  Lab Results  Component Value Date   HGBA1C 7.1 (H) 01/21/2020   REPTSTATUS PENDING 01/21/2020   CULT  01/21/2020    NO GROWTH < 24 HOURS Performed at [facility], [address]  [place], [place]     Lab Results  Component Value Date   ALBUMIN 3.7 01/21/2020    Neurologic: Patient does not have protective sensation bilateral lower extremities.   MUSCULOSKELETAL:   Skin: Examination patient has in 48 hours advanced progressive gangrenous changes to the fourth toe.  The area of cellulitis has expanded on the dorsum of his foot extending medially to the first metatarsal and proximal to the ankle.  The gangrenous ulcerative area has extended from the fourth toe to the base of the fifth toe and base of the third toe.  The black gangrenous changes has extended more proximal in the plantar aspect as well.  Patient has a strong dorsalis pedis pulse that is palpable on the right Doppler shows this to be a triphasic flow.  Patient has a thready palpable pulse dorsalis pedis on the left with a strong monophasic dorsalis pedis pulse by Doppler.  Review of the radiographs shows destructive bony changes of the middle and distal phalanx fourth toe left foot with severe  peripheral vascular disease with calcifications of the arteries out to the toes.  White blood cell count elevated at 13.1, hemoglobin 13.3.  Assessment: Assessment: Diabetic insensate neuropathy with peripheral vascular disease and worsening gangrenous changes to the fourth toe of the left foot.  Plan: Discussed with the patient with the advancing gangrenous changes have recommended proceeding with a left transmetatarsal amputation risks and benefits were discussed including risk of persistent infection nonhealing of the wound need for higher level amputation.  Discussed the importance of strict nonweightbearing postoperatively.  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  Ankle-brachial indices are still pending and patient will need vascular follow-up as well.  Thank you for the consult and the opportunity to see Mr.  Gabriella Guile, [place] [phone] 12:47 PM

## 2020-01-22 NOTE — Progress Notes (Signed)
Pt arrived to 6N09 via [facility]. Received report from [place], Charity fundraiser at [facility] ED. Will continue to monitor.

## 2020-01-23 ENCOUNTER — Inpatient Hospital Stay (HOSPITAL_COMMUNITY): Payer: BC Managed Care – PPO | Admitting: Anesthesiology

## 2020-01-23 ENCOUNTER — Encounter (HOSPITAL_COMMUNITY)
Admission: EM | Disposition: A | Discharge status: Home Health | Payer: Self-pay | Source: Home / Self Care | Attending: Internal Medicine

## 2020-01-23 ENCOUNTER — Other Ambulatory Visit: Payer: Self-pay | Admitting: Physician Assistant

## 2020-01-23 ENCOUNTER — Inpatient Hospital Stay (HOSPITAL_COMMUNITY): Payer: BC Managed Care – PPO

## 2020-01-23 ENCOUNTER — Encounter (HOSPITAL_COMMUNITY): Payer: Self-pay | Admitting: Internal Medicine

## 2020-01-23 DIAGNOSIS — N179 Acute kidney failure, unspecified: Secondary | ICD-10-CM

## 2020-01-23 DIAGNOSIS — I96 Gangrene, not elsewhere classified: Secondary | ICD-10-CM

## 2020-01-23 DIAGNOSIS — I739 Peripheral vascular disease, unspecified: Secondary | ICD-10-CM

## 2020-01-23 DIAGNOSIS — L0390 Cellulitis, unspecified: Secondary | ICD-10-CM

## 2020-01-23 DIAGNOSIS — R6520 Severe sepsis without septic shock: Secondary | ICD-10-CM

## 2020-01-23 DIAGNOSIS — N1831 Chronic kidney disease, stage 3a: Secondary | ICD-10-CM

## 2020-01-23 DIAGNOSIS — I10 Essential (primary) hypertension: Secondary | ICD-10-CM

## 2020-01-23 DIAGNOSIS — D72829 Elevated white blood cell count, unspecified: Secondary | ICD-10-CM

## 2020-01-23 DIAGNOSIS — E1169 Type 2 diabetes mellitus with other specified complication: Secondary | ICD-10-CM

## 2020-01-23 DIAGNOSIS — M869 Osteomyelitis, unspecified: Secondary | ICD-10-CM

## 2020-01-23 HISTORY — PX: AMPUTATION: SHX166

## 2020-01-23 LAB — GLUCOSE, CAPILLARY
Glucose-Capillary: 137 mg/dL — ABNORMAL HIGH (ref 70–99)
Glucose-Capillary: 110 mg/dL — ABNORMAL HIGH (ref 70–99)
Glucose-Capillary: 112 mg/dL — ABNORMAL HIGH (ref 70–99)
Glucose-Capillary: 95 mg/dL (ref 70–99)
Glucose-Capillary: 173 mg/dL — ABNORMAL HIGH (ref 70–99)

## 2020-01-23 LAB — ABO/RH: ABO/RH(D): O NEG

## 2020-01-23 LAB — TYPE AND SCREEN
ABO/RH(D): O NEG
Antibody Screen: NEGATIVE

## 2020-01-23 SURGERY — AMPUTATION, FOOT, PARTIAL
Anesthesia: Regional | Site: Foot | Laterality: Left

## 2020-01-23 MED ORDER — PROPOFOL 10 MG/ML IV BOLUS
INTRAVENOUS | Status: DC | PRN
  Administered 2020-01-23: 50 mg via INTRAVENOUS
  Administered 2020-01-23: 150 mg via INTRAVENOUS

## 2020-01-23 MED ORDER — CEFAZOLIN SODIUM-DEXTROSE 2-4 GM/100ML-% IV SOLN
INTRAVENOUS | Status: AC
  Filled 2020-01-23: qty 100

## 2020-01-23 MED ORDER — PHENYLEPHRINE 40 MCG/ML (10ML) SYRINGE FOR IV PUSH (FOR BLOOD PRESSURE SUPPORT)
PREFILLED_SYRINGE | INTRAVENOUS | Status: DC | PRN
  Administered 2020-01-23: 120 ug via INTRAVENOUS

## 2020-01-23 MED ORDER — CEFAZOLIN SODIUM-DEXTROSE 2-4 GM/100ML-% IV SOLN
2.0000 g | INTRAVENOUS | Status: AC
  Administered 2020-01-23: 16:00:00 2 g via INTRAVENOUS

## 2020-01-23 MED ORDER — ROPIVACAINE HCL 5 MG/ML IJ SOLN
INTRAMUSCULAR | Status: DC | PRN
  Administered 2020-01-23: 20 mL via PERINEURAL

## 2020-01-23 MED ORDER — LORAZEPAM 2 MG/ML IJ SOLN
1.0000 mg | Freq: Three times a day (TID) | INTRAMUSCULAR | Status: DC | PRN
  Administered 2020-01-24: 1 mg via INTRAVENOUS
  Filled 2020-01-23: qty 1

## 2020-01-23 MED ORDER — 0.9 % SODIUM CHLORIDE (POUR BTL) OPTIME
TOPICAL | Status: DC | PRN
  Administered 2020-01-23: 1000 mL

## 2020-01-23 MED ORDER — LIDOCAINE 2% (20 MG/ML) 5 ML SYRINGE
INTRAMUSCULAR | Status: DC | PRN
  Administered 2020-01-23: 20 mg via INTRAVENOUS

## 2020-01-23 MED ORDER — BUPIVACAINE LIPOSOME 1.3 % IJ SUSP
INTRAMUSCULAR | Status: DC | PRN
  Administered 2020-01-23: 10 mL via PERINEURAL

## 2020-01-23 MED ORDER — FENTANYL CITRATE (PF) 100 MCG/2ML IJ SOLN: 50.0000 ug | Freq: Once | INTRAMUSCULAR | Status: AC

## 2020-01-23 MED ORDER — LACTATED RINGERS IV SOLN: INTRAVENOUS | Status: DC

## 2020-01-23 MED ORDER — BUPIVACAINE HCL (PF) 0.5 % IJ SOLN
INTRAMUSCULAR | Status: DC | PRN
  Administered 2020-01-23: 15 mL via PERINEURAL

## 2020-01-23 MED ORDER — FENTANYL CITRATE (PF) 100 MCG/2ML IJ SOLN
INTRAMUSCULAR | Status: AC
  Administered 2020-01-23: 50 ug via INTRAVENOUS
  Filled 2020-01-23: qty 2

## 2020-01-23 MED ORDER — MIDAZOLAM HCL 2 MG/2ML IJ SOLN
INTRAMUSCULAR | Status: AC
  Administered 2020-01-23: 2 mg via INTRAVENOUS
  Filled 2020-01-23: qty 2

## 2020-01-23 MED ORDER — MIDAZOLAM HCL 2 MG/2ML IJ SOLN: 2.0000 mg | Freq: Once | INTRAMUSCULAR | Status: AC

## 2020-01-23 MED ORDER — ONDANSETRON HCL 4 MG/2ML IJ SOLN
INTRAMUSCULAR | Status: DC | PRN
  Administered 2020-01-23: 4 mg via INTRAVENOUS

## 2020-01-23 SURGICAL SUPPLY — 38 items
APL SKNCLS STERI-STRIP NONHPOA (GAUZE/BANDAGES/DRESSINGS)
BENZOIN TINCTURE PRP APPL 2/3 (GAUZE/BANDAGES/DRESSINGS) ×1 IMPLANT
BLADE SAGITTAL (BLADE) ×3
BLADE SAW SGTL HD 18.5X60.5X1. (BLADE) ×3 IMPLANT
BLADE SAW THK.89X75X18XSGTL (BLADE) IMPLANT
BLADE SURG 21 STRL SS (BLADE) ×3 IMPLANT
BNDG COHESIVE 4X5 TAN STRL (GAUZE/BANDAGES/DRESSINGS) IMPLANT
BNDG GAUZE ELAST 4 BULKY (GAUZE/BANDAGES/DRESSINGS) IMPLANT
COVER SURGICAL LIGHT HANDLE (MISCELLANEOUS) ×3 IMPLANT
COVER WAND RF STERILE (DRAPES) ×1 IMPLANT
DRAPE INCISE IOBAN 66X45 STRL (DRAPES) ×3 IMPLANT
DRAPE U-SHAPE 47X51 STRL (DRAPES) ×3 IMPLANT
DRSG ADAPTIC 3X8 NADH LF (GAUZE/BANDAGES/DRESSINGS) IMPLANT
DRSG PAD ABDOMINAL 8X10 ST (GAUZE/BANDAGES/DRESSINGS) IMPLANT
DURAPREP 26ML APPLICATOR (WOUND CARE) ×3 IMPLANT
ELECT REM PT RETURN 9FT ADLT (ELECTROSURGICAL) ×3
ELECTRODE REM PT RTRN 9FT ADLT (ELECTROSURGICAL) ×1 IMPLANT
GAUZE SPONGE 4X4 12PLY STRL (GAUZE/BANDAGES/DRESSINGS) IMPLANT
GLOVE BIOGEL PI IND STRL 9 (GLOVE) ×1 IMPLANT
GLOVE BIOGEL PI INDICATOR 9 (GLOVE) ×2
GLOVE SURG ORTHO 9.0 STRL STRW (GLOVE) ×3 IMPLANT
GOWN STRL REUS W/ TWL XL LVL3 (GOWN DISPOSABLE) ×3 IMPLANT
GOWN STRL REUS W/TWL XL LVL3 (GOWN DISPOSABLE) ×9
KIT BASIN OR (CUSTOM PROCEDURE TRAY) ×3 IMPLANT
KIT DRSG PREVENA PLUS 7DAY 125 (MISCELLANEOUS) ×2 IMPLANT
KIT PREVENA INCISION MGT 13 (CANNISTER) ×2 IMPLANT
KIT TURNOVER KIT B (KITS) ×3 IMPLANT
NS IRRIG 1000ML POUR BTL (IV SOLUTION) ×3 IMPLANT
PACK ORTHO EXTREMITY (CUSTOM PROCEDURE TRAY) ×3 IMPLANT
PAD ARMBOARD 7.5X6 YLW CONV (MISCELLANEOUS) ×6 IMPLANT
SPONGE LAP 18X18 RF (DISPOSABLE) IMPLANT
SUT ETHILON 2 0 PSLX (SUTURE) ×6 IMPLANT
TOWEL GREEN STERILE (TOWEL DISPOSABLE) ×3 IMPLANT
TOWEL GREEN STERILE FF (TOWEL DISPOSABLE) ×3 IMPLANT
TUBE CONNECTING 12'X1/4 (SUCTIONS) ×1
TUBE CONNECTING 12X1/4 (SUCTIONS) ×2 IMPLANT
WATER STERILE IRR 1000ML POUR (IV SOLUTION) ×1 IMPLANT
YANKAUER SUCT BULB TIP NO VENT (SUCTIONS) ×3 IMPLANT

## 2020-01-23 NOTE — Consult Note (Addendum)
Hospital Consult    Reason for Consult:  vascular evaluation Requesting Physician:  Dr. Lajoyce Corners MRN #:  [id number]  History of Present Illness: This is a 64 y.o. male who presented to the [facility] emergency department with an approximate 1 week history of left fourth toe infection extending into the forefoot.  He is interviewed and examined on 6 N. nursing floor where he is alert, oriented and in no apparent distress.  His wife is at bedside.  He is n.p.o. for left transmetatarsal amputation by Dr. Lajoyce Corners today.  The patient states he has been diabetic for approximately 1 year.  He says he noticed a small area of skin breakdown on his left fourth toe and over 1 week it advanced.  He denies fever or chills.  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  He has had no prior cardiac or vascular interventions/surgery.  His family history is significant for carotid artery stenosis in his father who underwent endarterectomies.  His dad was a heavy smoker.  The pt is on a statin for cholesterol management.  The pt is not on a daily aspirin.   Other AC:  none The pt is on beta-blocker for hypertension.   The pt is diabetic.  Metformin at home Tobacco hx: None  Past Medical History:  Diagnosis Date  . Diabetes mellitus, type 2 (HCC)    diet controlled  . History of kidney stones    X2 4or 5 yrs ago  . Hypertension    controlled on meds    Past Surgical History:  Procedure Laterality Date  . CATARACT EXTRACTION W/PHACO Left 03/19/2019   Procedure: CATARACT EXTRACTION PHACO AND INTRAOCULAR LENS PLACEMENT (IOC)  LEFT DIABETIC symfony lens;  Surgeon: Nevada Crane, MD;  Location: [facility];  Service: Ophthalmology;  Laterality: Left;  Diabetic - oral meds  . CATARACT EXTRACTION W/PHACO Right 04/23/2019   Procedure: CATARACT EXTRACTION PHACO AND INTRAOCULAR LENS PLACEMENT (IOC)  RIGHT DIABETIC SYMFONY TORIC LENS;  Surgeon: Nevada Crane, MD;  Location: [facility];  Service: Ophthalmology;  Laterality: Right;  Diabetes-oral med  . ROTATOR CUFF REPAIR  10/2015    No Known Allergies  Prior to Admission medications   Medication Sig Start Date End Date Taking? Authorizing Provider  acetaminophen (TYLENOL) 500 MG tablet Take 1,000 mg by mouth every 6 (six) hours as needed for moderate pain.   Yes [provider]  Cyanocobalamin (VITAMIN B-12 SL) Place under the tongue once a week.    Yes [provider]  doxycycline (VIBRAMYCIN) 100 MG capsule Take 1 capsule (100 mg total) by mouth 2 (two) times daily. 01/20/20  Yes Wurst, [place], PA-C  metFORMIN (GLUCOPHAGE) 500 MG tablet Take 500 mg by mouth 2 (two) times daily with a meal.   Yes [provider]  metoprolol tartrate (LOPRESSOR) 50 MG tablet Take 50 mg by mouth daily. am   Yes [provider]    Social History   Socioeconomic History  . Marital status: Legally Separated    Spouse name: Not on file  . Number of children: Not on file  . Years of education: Not on file  . Highest education level: Not on file  Occupational History  . Not on file  Tobacco Use  . Smoking status: Never Smoker  . Smokeless tobacco: Never Used  Substance and Sexual Activity  . Alcohol use: Not Currently  . Drug use: Never  . Sexual activity: Not on file  Other Topics Concern  . Not on file  Social History Narrative  . Not on file   Social Determinants of Health   Financial Resource Strain:   . Difficulty of Paying Living Expenses:   Food Insecurity:   . Worried About Charity fundraiser in the Last Year:   . Arboriculturist in the Last Year:   Transportation Needs:   . Film/video editor (Medical):   Marland Kitchen Lack of Transportation (Non-Medical):   Physical Activity:   . Days of Exercise per Week:   . Minutes of Exercise per Session:   Stress:   . Feeling of Stress :   Social Connections:   . Frequency of Communication with Friends and Family:   . Frequency of  Social Gatherings with Friends and Family:   . Attends Religious Services:   . Active Member of Clubs or Organizations:   . Attends Archivist Meetings:   Marland Kitchen Marital Status:   Intimate Partner Violence:   . Fear of Current or Ex-Partner:   . Emotionally Abused:   Marland Kitchen Physically Abused:   . Sexually Abused:      Family History  Problem Relation Age of Onset  . Diabetes Mother   . Diabetes Father     ROS: [x]  Positive   [ ]  Negative   [ ]  All sytems reviewed and are negative  Cardiac: []  chest pain/pressure []  palpitations []  SOB lying flat []  DOE  Vascular: []  pain in legs while walking []  pain in legs at rest []  pain in legs at night [x]  non-healing ulcers []  hx of DVT []  swelling in legs  Pulmonary: []  productive cough []  asthma/wheezing []  home O2  Neurologic: []  weakness in []  arms []  legs []  numbness in []  arms []  legs []  hx of CVA []  mini stroke [] difficulty speaking or slurred speech []  temporary loss of vision in one eye []  dizziness  Hematologic: []  hx of cancer []  bleeding problems []  problems with blood clotting easily  Endocrine:   []  diabetes []  thyroid disease  GI []  vomiting blood []  blood in stool  GU: []  CKD/renal failure []  HD--[]  M/W/F or []  T/T/S []  burning with urination []  blood in urine  Psychiatric: []  anxiety []  depression  Musculoskeletal: []  arthritis []  joint pain  Integumentary: []  rashes []  ulcers  Constitutional: []  fever []  chills   Physical Examination  Vitals:   01/23/20 0545 01/23/20 0546  BP: (!) 157/90   Pulse: (!) 113 96  Resp: 18   Temp: 99.1 F (37.3 C)   SpO2: 97% 96%   Body mass index is 28.48 kg/m.  General:  WDWN in NAD Gait: Not observed HENT: WNL, normocephalic Pulmonary: normal non-labored breathing, without Rales, rhonchi,  wheezing Cardiac: regular, without  Murmurs, rubs or gallops; without carotid bruits Abdomen:  soft, NT/ND, no masses.  No pulsatile mass Skin:  without rashes Vascular Exam/Pulses:  Right Left  Radial 2+ (normal) 2+ (normal)  Ulnar  not evaluated  not evaluated  Brachial 2+ (normal) 2+ (normal)  Femoral 2+ (normal) 2+ (normal)  Popliteal 2+ (normal) 2+ (normal)  DP 2+ (normal) absent  PT 1+ (weak) absent  Peroneal  not evaluated  not evaluated   Extremities: with ischemic changes, with Gangrene , with cellulitis; without open wounds; examination of the left foot reveals gangrene of the left fourth toe, moderate edema of the foot and erythema extending into the mid forefoot.  Handheld Doppler not functional.  The left foot  is warm.  Musculoskeletal: no muscle wasting or atrophy  Neurologic: A&O X 3;  No focal weakness or paresthesias are detected; speech is fluent/normal Psychiatric:  The pt has Normal affect.  CBC    Component Value Date/Time   WBC 13.1 (H) 01/22/2020 0435   RBC 4.46 01/22/2020 0435   HGB 13.3 01/22/2020 0435   HCT 40.4 01/22/2020 0435   PLT 210 01/22/2020 0435   MCV 90.6 01/22/2020 0435   MCH 29.8 01/22/2020 0435   MCHC 32.9 01/22/2020 0435   RDW 13.3 01/22/2020 0435   LYMPHSABS 1.0 01/21/2020 1127   MONOABS 1.3 (H) 01/21/2020 1127   EOSABS 0.1 01/21/2020 1127   BASOSABS 0.1 01/21/2020 1127    BMET    Component Value Date/Time   NA 135 01/22/2020 0435   K 3.7 01/22/2020 0435   CL 102 01/22/2020 0435   CO2 22 01/22/2020 0435   GLUCOSE 105 (H) 01/22/2020 0435   BUN 16 01/22/2020 0435   CREATININE 1.10 01/22/2020 0435   CALCIUM 8.3 (L) 01/22/2020 0435   GFRNONAA >60 01/22/2020 0435   GFRAA >60 01/22/2020 0435    COAGS: No results found for: INR, PROTIME    Left foot x-ray  01/21/2020 IMPRESSION: 1. Ulceration along the medial fourth toe with underlying osteomyelitis of the fourth middle and distal phalanges and gangrenous soft tissue infection of the fourth toe.   Non-Invasive Vascular Imaging:   ABIs ordered and are pending.   ASSESSMENT/PLAN: This is a 64 y.o. male with  history of diabetes mellitus, hypertension with gangrene of the left fourth toe and significant cellulitis of the left forefoot.  Mild leukocytosis.  Suspect small vessel disease but need to review ABIs once performed.  Transmetatarsal amputation planned by Dr. Lajoyce Corners.  Antibiotics continue  -Further evaluation/recommendations per Dr. Arbie Cookey.   Wendi Maya, PA-C [facility] [phone]  I have examined the patient, reviewed and agree with above.  Otherwise healthy very active gentleman presenting with gangrene in his left foot.  Reports that this was very rapidly progressive.  Denies any trauma to his foot.  On physical exam he has a complete necrosis of the fourth toe and fourth metatarsal head with extensive cellulitis involving his entire distal foot.  His great toe is not involved with the cellulitis.  By physical exam he has a 2-3+ left popliteal pulse.  He actually has 1-2+ palpable anterior tibial pulse above the ankle.  I do not feel a dorsalis pedis pulse but he does have extensive edema and his foot.  On the right he has 2-3+ dorsalis pedis pulse.  I do not feel posterior tibial pulse on the right.  I listen with hand-held Doppler and this does show normal triphasic signal on the dorsalis pedis.  He does have good flow at his posterior tibial on the left and also has audible flow at the great toe digital artery.  Noninvasive studies today reveal normal ankle arm index bilaterally.  He has biphasic dorsalis pedis and posterior tibial waveforms bilaterally.  I had a very long discussion with the patient.  He does appear to have adequate flow to his left foot and near normal flow to his left foot.  Unusual presentation with extensive gangrenous changes which have rapidly progressed.  I agree with urgent amputation today with Dr. Lajoyce Corners.  Do not see any role for arteriography or further vascular evaluation.  I explained to the patient that he may have nonhealing but it would  not be based on  vascular occlusive disease but that he does have a severe limb threatening infection.    Gretta Began, MD 01/23/2020 3:09 PM

## 2020-01-23 NOTE — Plan of Care (Signed)
  Problem: Education: Goal: Knowledge of General Education information will improve Description: Including pain rating scale, medication(s)/side effects and non-pharmacologic comfort measures Outcome: Progressing   Problem: Health Behavior/Discharge Planning: Goal: Ability to manage health-related needs will improve Outcome: Progressing  Plan for surgery discussed, plan to manage anxiety discussed.  Problem: Clinical Measurements: Goal: Will remain free from infection Outcome: Progressing  Patient afebrile.  Problem: Activity: Goal: Risk for activity intolerance will decrease Outcome: Progressing  Patient to EOB with assist, denies pain.

## 2020-01-23 NOTE — Anesthesia Postprocedure Evaluation (Signed)
Anesthesia Post Note  Patient: Brent Lam  Procedure(s) Performed: TRANSMETATARSAL AMPUTATION FOOT (Left Foot)     Patient location during evaluation: PACU Anesthesia Type: General Level of consciousness: awake and alert Pain management: pain level controlled Vital Signs Assessment: post-procedure vital signs reviewed and stable Respiratory status: spontaneous breathing, nonlabored ventilation, respiratory function stable and patient connected to nasal cannula oxygen Cardiovascular status: blood pressure returned to baseline and stable Postop Assessment: no apparent nausea or vomiting Anesthetic complications: no    Last Vitals:  Vitals:   01/23/20 1520 01/23/20 1624  BP: (!) 142/66 101/61  Pulse: 89 78  Resp: 19 20  Temp:  36.9 C  SpO2: 96% 99%    Last Pain:  Vitals:   01/23/20 1624  TempSrc:   PainSc: Asleep                 Cecile Hearing

## 2020-01-23 NOTE — Anesthesia Procedure Notes (Signed)
Anesthesia Regional Block: Adductor canal block   Pre-Anesthetic Checklist: ,, timeout performed, Correct Patient, Correct Site, Correct Laterality, Correct Procedure, Correct Position, site marked, Risks and benefits discussed,  Surgical consent,  Pre-op evaluation,  At surgeon's request and post-op pain management  Laterality: Left  Prep: chloraprep       Needles:  Injection technique: Single-shot  Needle Type: Echogenic Needle     Needle Length: 9cm  Needle Gauge: 21     Additional Needles:   Procedures:,,,, ultrasound used (permanent image in chart),,,,  Narrative:  Start time: 01/23/2020 3:13 PM End time: 01/23/2020 3:18 PM Injection made incrementally with aspirations every 5 mL.  Performed by: Personally  Anesthesiologist: Cecile Hearing, MD  Additional Notes: No pain on injection. No increased resistance to injection. Injection made in 5cc increments.  Good needle visualization.  Patient tolerated procedure well.

## 2020-01-23 NOTE — Progress Notes (Signed)
Pt. was a yellow MEWS due to heart rate of 115. Katherina Right, NP notified. No new orders received. Order for one time dose of Ativan given. Will implement yellow MEWS protocol and continue to monitor pt.

## 2020-01-23 NOTE — Progress Notes (Signed)
PROGRESS NOTE    Brent Lam  HYI:[id number] DOB: [birth year] DOA: 01/21/2020 PCP: Rusty Aus, MD    Chief Complaint  Patient presents with  . Foot Pain    Brief Narrative:  Brent Lam a 64 y.o.malewith medical history significant fornoninsulin-dependent type 2 diabetes, hypertension . -Presented to the ED with swelling pain redness and discoloration of third, fourth toes on his left foot , started approximately a week ago, noticed worsening swelling and erythema spreading proximally up his foot  -Work-up in the ED was notable for leukocytosis and x-ray concerning for osteomyelitis of the fourth toe.  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 -Continue supportive care and analgesics -Follow blood culture results.  Type 2 diabetes mellitus  -Most recent A1c 7.1 -Continue holding oral hypoglycemic agents while inpatient -Continue sliding scale insulin  Essential hypertension -Overall stable -Continue Lopressor  Mild acute kidney injury -Most likely in the setting of sepsis on presentation -Renal function now back to baseline -Continue to monitor trend.  Anxiety -We will use as needed Ativan -Patient very anxious in the setting of planned amputation.   DVT prophylaxis: Lovenox Code Status: Full code Family Communication: Significant other at bedside Disposition:   Status is: Inpatient  Dispo: The patient is from: home              Anticipated d/c is to: most likely home, but will imagine PT evaluation after surgery needed.              Anticipated d/c date is: 01/24/20,  if clear by surgery for it.              Patient currently unstable for discharge; planning transmetatarsal amputation today and will continue IV antibiotics for his left 4th toe osteomyelitis and gangrene.        Consultants:   Orthopedic surgery (Dr. Sharol Given)   Procedures: See below for x-ray reports Anticipated left transmetatarsal amputation later today 01/23/2020   Antimicrobials:  Continue vancomycin and Rocephin   Subjective: No fever, no nausea, no vomiting, no chest pain.  Reports pain overall well controlled.  Anxious about surgical intervention to assess completed acute left foot osteomyelitis.  Objective: Vitals:   01/23/20 0545 01/23/20 0546 01/23/20 1015 01/23/20 1414  BP: (!) 157/90  (!) 141/77 (!) 155/83  Pulse: (!) 113 96 85 90  Resp: '18  19 19  ' Temp: 99.1 F (37.3 C)  98.3 F (36.8 C) 98.8 F (37.1 C)  TempSrc: Oral  Oral Oral  SpO2: 97% 96% 96% 96%  Weight:      Height:        Intake/Output Summary (Last 24 hours) at 01/23/2020 1501 Last data filed at 01/23/2020 1419 Gross per 24 hour  Intake 1898.31 ml  Output 1300 ml  Net 598.31 ml   Filed Weights   01/21/20 1133  Weight: 95.3 kg    Examination: General exam: Appears calm and reports no significant pain in his left foot; very anxious about surgical intervention; no chest pain, no fever, no nausea, no vomiting. Respiratory system: Clear to auscultation. Respiratory effort normal. Cardiovascular system: S1 & S2 heard, RRR. No JVD, murmurs, rubs, gallops or clicks. No pedal edema. Gastrointestinal system: Abdomen is  nondistended, soft and nontender. No organomegaly or masses felt. Normal bowel sounds heard. Central nervous system: Alert and oriented. No focal neurological deficits. Extremities/skin: Symmetric 5 x 5 power.  Left foot with improvement in swelling and erythematous changes; still with gangrenous changes mainly affecting fourth toe.  Psychiatry: Judgement and insight appear normal.  Mood & affect appropriate.     Data Reviewed: I have personally reviewed following labs and imaging studies  CBC: Recent Labs  Lab 01/21/20 1127 01/21/20 1619 01/22/20 0435  WBC 18.8* 16.3* 13.1*  NEUTROABS 16.3*  --   --   HGB 15.2 14.5 13.3  HCT 46.9 43.3 40.4  MCV 92.3 91.9 90.6  PLT 232 206 810    Basic Metabolic Panel: Recent Labs  Lab 01/21/20 1127 01/21/20 1619 01/22/20 0435  NA 134*  --  135  K 4.4  --  3.7  CL 98  --  102  CO2 24  --  22  GLUCOSE 190*  --  105*  BUN 19  --  16  CREATININE 1.33* 1.11 1.10  CALCIUM 8.9  --  8.3*    GFR: Estimated Creatinine Clearance: 82.3 mL/min (by C-G formula based on SCr of 1.1 mg/dL).  Liver Function Tests: Recent Labs  Lab 01/21/20 1127  AST 12*  ALT 11  ALKPHOS 63  BILITOT 0.7  PROT 7.9  ALBUMIN 3.7    CBG: Recent Labs  Lab 01/22/20 1233 01/22/20 2144 01/23/20 0756 01/23/20 1127 01/23/20 1452  GLUCAP 110* 92 137* 110* 112*     Recent Results (from the past 240 hour(s))  Culture, blood (routine x 2)     Status: None (Preliminary result)   Collection Time: 01/21/20 12:20 PM   Specimen: BLOOD  Result Value Ref Range Status   Specimen Description   Final    BLOOD RIGHT ANTECUBITAL Performed at [facility], [place] [address], [place], [place] [address]    Special Requests   Final    BOTTLES DRAWN AEROBIC AND ANAEROBIC Blood Culture results may not be optimal due to an excessive volume of blood received in culture bottles Performed at [facility] [address] [place], [place] [address]    Culture   Final    NO GROWTH 2 DAYS Performed at [facility] [address] [place], [place] [address]    Report Status PENDING  Incomplete  Culture, blood (routine x 2)     Status: None (Preliminary result)   Collection Time: 01/21/20 12:33 PM   Specimen: BLOOD RIGHT HAND  Result Value Ref Range Status   Specimen Description   Final    BLOOD RIGHT  HAND Performed at [facility] [address], [place], [place] [address]    Special Requests   Final    BOTTLES DRAWN AEROBIC AND ANAEROBIC Blood Culture adequate volume Performed at [facility] [address], [place], [place] [address]    Culture   Final    NO GROWTH 2 DAYS Performed at [facility] [address], [place], [place] [address]    Report Status PENDING  Incomplete  Respiratory Panel by RT PCR (Flu A&B, Covid) - Nasopharyngeal Swab     Status: None   Collection Time: 01/21/20 12:33 PM   Specimen: Nasopharyngeal Swab  Result Value Ref Range Status   SARS Coronavirus 2 by RT PCR NEGATIVE NEGATIVE Final    Comment: (NOTE) SARS-CoV-2 target nucleic acids are NOT DETECTED. The SARS-CoV-2  RNA is generally detectable in upper respiratoy specimens during the acute phase of infection. The lowest concentration of SARS-CoV-2 viral copies this assay can detect is 131 copies/mL. A negative result does not preclude SARS-Cov-2 infection and should not be used as the sole basis for treatment or other patient management decisions. A negative result may occur with  improper specimen collection/handling, submission of specimen other than nasopharyngeal swab, presence of viral mutation(s) within the areas targeted by this assay, and inadequate number of viral copies (<131 copies/mL). A negative result must be combined with clinical observations, patient history, and epidemiological information. The expected result is Negative. Fact Sheet for Patients:  PinkCheek.be Fact Sheet for Healthcare Providers:  GravelBags.it This test is not yet ap proved or cleared by the [place] FDA and  has been authorized for detection and/or diagnosis of SARS-CoV-2 by FDA under an Emergency Use Authorization (EUA). This EUA will remain  in effect (meaning this test can be used) for the duration  of the COVID-19 declaration under Section 564(b)(1) of the Act, 21 U.S.C. section 360bbb-3(b)(1), unless the authorization is terminated or revoked sooner.    Influenza A by PCR NEGATIVE NEGATIVE Final   Influenza B by PCR NEGATIVE NEGATIVE Final    Comment: (NOTE) The Xpert Xpress SARS-CoV-2/FLU/RSV assay is intended as an aid in  the diagnosis of influenza from Nasopharyngeal swab specimens and  should not be used as a sole basis for treatment. Nasal washings and  aspirates are unacceptable for Xpert Xpress SARS-CoV-2/FLU/RSV  testing. Fact Sheet for Patients: PinkCheek.be Fact Sheet for Healthcare Providers: GravelBags.it This test is not yet approved or cleared by the [place] FDA and  has been authorized for detection and/or diagnosis of SARS-CoV-2 by  FDA under an Emergency Use Authorization (EUA). This EUA will remain  in effect (meaning this test can be used) for the duration of the  Covid-19 declaration under Section 564(b)(1) of the Act, 21  U.S.C. section 360bbb-3(b)(1), unless the authorization is  terminated or revoked. Performed at [facility], [place] [address], [place], [place] [address]      Radiology Studies: VAS [place] ABI WITH/WO TBI  Result Date: 01/23/2020 LOWER EXTREMITY DOPPLER STUDY Indications: Ulceration, gangrene, and peripheral artery disease. High Risk Factors: Hypertension.  Comparison Study: No prior studies. Performing Technologist: Carisa Backhaus Levering Rvt  Examination Guidelines: A complete evaluation includes at minimum, Doppler waveform signals and systolic blood pressure reading at the level of bilateral brachial, anterior tibial, and posterior tibial arteries, when vessel segments are accessible. Bilateral testing is considered an integral part of a complete examination. Photoelectric Plethysmograph (PPG) waveforms and toe systolic pressure readings are included as required and  additional duplex testing as needed. Limited examinations for reoccurring indications may be performed as noted.  ABI Findings: +---------+------------------+-----+---------+--------+ Right    Rt Pressure (mmHg)IndexWaveform Comment  +---------+------------------+-----+---------+--------+ Brachial 150                    triphasic         +---------+------------------+-----+---------+--------+ PTA      213               1.37 biphasic          +---------+------------------+-----+---------+--------+ DP       195               1.25 biphasic          +---------+------------------+-----+---------+--------+ Audrie Lia  0.76                   +---------+------------------+-----+---------+--------+ +---------+------------------+-----+---------+-------+ Left     Lt Pressure (mmHg)IndexWaveform Comment +---------+------------------+-----+---------+-------+ Brachial 156                    triphasic        +---------+------------------+-----+---------+-------+ PTA      187               1.20 biphasic         +---------+------------------+-----+---------+-------+ DP       185               1.19 biphasic         +---------+------------------+-----+---------+-------+ Great Toe54                0.35                  +---------+------------------+-----+---------+-------+ +-------+-----------+-----------+------------+------------+ ABI/TBIToday's ABIToday's TBIPrevious ABIPrevious TBI +-------+-----------+-----------+------------+------------+ Right  1.37       0.76                                +-------+-----------+-----------+------------+------------+ Left   1.2        0.35                                +-------+-----------+-----------+------------+------------+  Summary: Right: Resting right ankle-brachial index indicates noncompressible right lower extremity arteries. The right toe-brachial index is normal. Left: Resting left  ankle-brachial index is within normal range. No evidence of significant left lower extremity arterial disease. The left toe-brachial index is abnormal.  *See table(s) above for measurements and observations.     Preliminary     Scheduled Meds: . [MAR Hold] enoxaparin (LOVENOX) injection  40 mg Subcutaneous Q24H  . fentaNYL      . [MAR Hold] insulin aspart  0-15 Units Subcutaneous TID WC  . [MAR Hold] insulin aspart  0-5 Units Subcutaneous QHS  . [MAR Hold] metoprolol tartrate  50 mg Oral Daily  . midazolam       Continuous Infusions: . sodium chloride 75 mL/hr at 01/22/20 2235  . ceFAZolin    .  ceFAZolin (ANCEF) IV    . [MAR Hold] cefTRIAXone (ROCEPHIN)  IV Stopped (01/22/20 1809)  . lactated ringers    . [MAR Hold] vancomycin 1,250 mg (01/23/20 0532)     LOS: 2 days    Time spent: 35 minutes.    Barton Dubois, MD [facility]   To contact the attending provider between 7A-7P or the covering provider during after hours 7P-7A, please log into the web site [email or web address] and access using universal [place] password for that web site. If you do not have the password, please call the hospital operator.  01/23/2020, 3:01 PM

## 2020-01-23 NOTE — Anesthesia Preprocedure Evaluation (Addendum)
Anesthesia Evaluation  Patient identified by MRN, date of birth, ID band Patient awake    Reviewed: Allergy & Precautions, NPO status , Patient's Chart, lab work & pertinent test results, reviewed documented beta blocker date and time   Airway Mallampati: II  TM Distance: >3 FB Neck ROM: Full    Dental  (+) Teeth Intact, Dental Advisory Given, Caps,    Pulmonary neg pulmonary ROS,    Pulmonary exam normal breath sounds clear to auscultation       Cardiovascular hypertension, Pt. on home beta blockers + Peripheral Vascular Disease  Normal cardiovascular exam Rhythm:Regular Rate:Normal     Neuro/Psych negative neurological ROS  negative psych ROS   GI/Hepatic negative GI ROS, Neg liver ROS,   Endo/Other  diabetes, Type 2, Oral Hypoglycemic Agents  Renal/GU Renal InsufficiencyRenal disease     Musculoskeletal  (+) Arthritis , osteomyelitis   Abdominal   Peds  Hematology negative hematology ROS (+)   Anesthesia Other Findings Day of surgery medications reviewed with the patient.  Reproductive/Obstetrics                            Anesthesia Physical Anesthesia Plan  ASA: III  Anesthesia Plan: General   Post-op Pain Management:  Regional for Post-op pain   Induction: Intravenous  PONV Risk Score and Plan: 2 and Treatment may vary due to age or medical condition, Dexamethasone, Ondansetron and Midazolam  Airway Management Planned: LMA  Additional Equipment:   Intra-op Plan:   Post-operative Plan: Extubation in OR  Informed Consent: I have reviewed the patients History and Physical, chart, labs and discussed the procedure including the risks, benefits and alternatives for the proposed anesthesia with the patient or authorized representative who has indicated his/her understanding and acceptance.     Dental advisory given  Plan Discussed with: CRNA  Anesthesia Plan Comments:         Anesthesia Quick Evaluation

## 2020-01-23 NOTE — Progress Notes (Signed)
Pt. reported that Ativan was helpful in relieving pt. anxiety.

## 2020-01-23 NOTE — Anesthesia Procedure Notes (Signed)
Procedure Name: LMA Insertion Date/Time: 01/23/2020 3:52 PM Performed by: Laruth Bouchard., CRNA Pre-anesthesia Checklist: Patient identified, Emergency Drugs available, Patient being monitored and Suction available Patient Re-evaluated:Patient Re-evaluated prior to induction Oxygen Delivery Method: Circle system utilized Preoxygenation: Pre-oxygenation with 100% oxygen Induction Type: IV induction Ventilation: Mask ventilation without difficulty LMA: LMA inserted LMA Size: 4.0 Number of attempts: 1 Placement Confirmation: positive ETCO2 Tube secured with: Tape Dental Injury: Teeth and Oropharynx as per pre-operative assessment

## 2020-01-23 NOTE — Anesthesia Procedure Notes (Signed)
Anesthesia Regional Block: Popliteal block   Pre-Anesthetic Checklist: ,, timeout performed, Correct Patient, Correct Site, Correct Laterality, Correct Procedure, Correct Position, site marked, Risks and benefits discussed,  Surgical consent,  Pre-op evaluation,  At surgeon's request and post-op pain management  Laterality: Left  Prep: chloraprep       Needles:  Injection technique: Single-shot  Needle Type: Echogenic Needle     Needle Length: 9cm  Needle Gauge: 21     Additional Needles:   Procedures:,,,, ultrasound used (permanent image in chart),,,,  Narrative:  Start time: 01/23/2020 3:03 PM End time: 01/23/2020 3:13 PM Injection made incrementally with aspirations every 5 mL.  Performed by: Personally  Anesthesiologist: Cecile Hearing, MD  Additional Notes: No pain on injection. No increased resistance to injection. Injection made in 5cc increments.  Good needle visualization.  Patient tolerated procedure well.

## 2020-01-23 NOTE — Op Note (Signed)
01/23/2020  4:26 PM  PATIENT:  Brent Lam    PRE-OPERATIVE DIAGNOSIS:  osteomylititis  POST-OPERATIVE DIAGNOSIS:  Same  PROCEDURE:  TRANSMETATARSAL AMPUTATION FOOT  SURGEON:  Nadara Mustard, MD  PHYSICIAN ASSISTANT:None ANESTHESIA:   General  PREOPERATIVE INDICATIONS:  [name] is a  64 y.o. male with a diagnosis of osteomylititis who failed conservative measures and elected for surgical management.    The risks benefits and alternatives were discussed with the patient preoperatively including but not limited to the risks of infection, bleeding, nerve injury, cardiopulmonary complications, the need for revision surgery, among others, and the patient was willing to proceed.  OPERATIVE IMPLANTS: Praveena wound VAC 13 cm  @ENCIMAGES @  OPERATIVE FINDINGS: Extensive necrotic tissue at the level of the transmetatarsal amputation with completely occluded dorsalis pedis artery.  Soft tissue sent for cultures.  OPERATIVE PROCEDURE: Patient was brought the operating room after undergoing a regional anesthetic he then underwent a general anesthetic.  The left lower extremity was prepped using DuraPrep draped into a sterile field a timeout was called.  A fishmouth incision was made just proximal to the necrotic tissue this was carried sharply down to bone and an oscillating saw was used to perform a transmetatarsal amputation.  There was extensive nonviable soft tissue that was sharply excised with a rondure and a 21 blade knife this was sent for cultures.  The dorsalis pedis artery was completely occluded there was some circulation through the plantar circulation.  Electrocautery was used for hemostasis the vessels were extensively calcified.  The wound was irrigated with normal saline.  The incision was closed using 2-0 nylon.  A Prevena wound VAC was applied this had a good suction fit patient was extubated taken the PACU in stable condition   DISCHARGE PLANNING:  Antibiotic duration: Would  continue IV antibiotics until cultures are finalized and then plan on 4 weeks of oral antibiotics.  Weightbearing: Strict nonweightbearing on the left  Pain medication: Opioid pathway  Dressing care/ Wound VAC: Continue wound VAC for 1 week after discharge  Ambulatory devices: Walker or crutches  Discharge to: Anticipate discharge to home.  Follow-up: In the office 1 week post operative.

## 2020-01-23 NOTE — Progress Notes (Signed)
ABI's have been completed. Preliminary results can be found in CV Proc through chart review.   01/23/20 11:32 AM Olen Cordial RVT

## 2020-01-23 NOTE — Transfer of Care (Signed)
Immediate Anesthesia Transfer of Care Note  Patient: Brent Lam  Procedure(s) Performed: TRANSMETATARSAL AMPUTATION FOOT (Left Foot)  Patient Location: PACU  Anesthesia Type:General and Regional  Level of Consciousness: drowsy  Airway & Oxygen Therapy: Patient Spontanous Breathing and Patient connected to face mask oxygen  Post-op Assessment: Report given to RN and Post -op Vital signs reviewed and stable  Post vital signs: Reviewed and stable  Last Vitals:  Vitals Value Taken Time  BP 101/61 01/23/20 1624  Temp 36.9 C 01/23/20 1624  Pulse 84 01/23/20 1627  Resp 21 01/23/20 1627  SpO2 98 % 01/23/20 1627  Vitals shown include unvalidated device data.  Last Pain:  Vitals:   01/23/20 1624  TempSrc:   PainSc: (P) Asleep         Complications: No apparent anesthesia complications

## 2020-01-24 ENCOUNTER — Inpatient Hospital Stay (HOSPITAL_COMMUNITY): Payer: BC Managed Care – PPO

## 2020-01-24 LAB — CBC
HCT: 38.9 % — ABNORMAL LOW (ref 39.0–52.0)
Hemoglobin: 12.8 g/dL — ABNORMAL LOW (ref 13.0–17.0)
MCH: 29.6 pg (ref 26.0–34.0)
MCHC: 32.9 g/dL (ref 30.0–36.0)
MCV: 90 fL (ref 80.0–100.0)
Platelets: 243 10*3/uL (ref 150–400)
RBC: 4.32 MIL/uL (ref 4.22–5.81)
RDW: 13.3 % (ref 11.5–15.5)
WBC: 12.6 10*3/uL — ABNORMAL HIGH (ref 4.0–10.5)
nRBC: 0 % (ref 0.0–0.2)

## 2020-01-24 LAB — BASIC METABOLIC PANEL
Anion gap: 11 (ref 5–15)
BUN: 13 mg/dL (ref 8–23)
CO2: 20 mmol/L — ABNORMAL LOW (ref 22–32)
Calcium: 8.1 mg/dL — ABNORMAL LOW (ref 8.9–10.3)
Chloride: 101 mmol/L (ref 98–111)
Creatinine, Ser: 1.13 mg/dL (ref 0.61–1.24)
GFR calc Af Amer: >60 mL/min (ref >60)
GFR calc non Af Amer: >60 mL/min (ref >60)
Glucose, Bld: 142 mg/dL — ABNORMAL HIGH (ref 70–99)
Potassium: 4 mmol/L (ref 3.5–5.1)
Sodium: 132 mmol/L — ABNORMAL LOW (ref 135–145)

## 2020-01-24 LAB — GLUCOSE, CAPILLARY
Glucose-Capillary: 118 mg/dL — ABNORMAL HIGH (ref 70–99)
Glucose-Capillary: 172 mg/dL — ABNORMAL HIGH (ref 70–99)
Glucose-Capillary: 142 mg/dL — ABNORMAL HIGH (ref 70–99)
Glucose-Capillary: 120 mg/dL — ABNORMAL HIGH (ref 70–99)

## 2020-01-24 MED ORDER — HYDROMORPHONE HCL 1 MG/ML IJ SOLN: 0.5000 mg | INTRAMUSCULAR | Status: DC | PRN

## 2020-01-24 MED ORDER — DOCUSATE SODIUM 100 MG PO CAPS
100.0000 mg | ORAL_CAPSULE | Freq: Two times a day (BID) | ORAL | Status: DC
  Administered 2020-01-24 – 2020-01-28 (×10): 100 mg via ORAL
  Filled 2020-01-24 (×10): qty 1

## 2020-01-24 MED ORDER — OXYCODONE HCL 5 MG PO TABS
10.0000 mg | ORAL_TABLET | ORAL | Status: DC | PRN
  Administered 2020-01-24 (×2): 10 mg via ORAL
  Administered 2020-01-26 – 2020-01-28 (×4): 15 mg via ORAL
  Filled 2020-01-24 (×3): qty 3
  Filled 2020-01-24: qty 2
  Filled 2020-01-24: qty 3
  Filled 2020-01-24: qty 2

## 2020-01-24 MED ORDER — METOCLOPRAMIDE HCL 5 MG/ML IJ SOLN: 5.0000 mg | Freq: Three times a day (TID) | INTRAMUSCULAR | Status: DC | PRN

## 2020-01-24 MED ORDER — ACETAMINOPHEN 325 MG PO TABS
650.0000 mg | ORAL_TABLET | Freq: Four times a day (QID) | ORAL | Status: DC | PRN
  Administered 2020-01-24 – 2020-01-26 (×3): 650 mg via ORAL
  Filled 2020-01-24 (×3): qty 2

## 2020-01-24 MED ORDER — ACETAMINOPHEN 650 MG RE SUPP: 650.0000 mg | Freq: Four times a day (QID) | RECTAL | Status: DC | PRN

## 2020-01-24 MED ORDER — VANCOMYCIN HCL 1250 MG/250ML IV SOLN
1250.0000 mg | Freq: Two times a day (BID) | INTRAVENOUS | Status: DC
  Administered 2020-01-24 – 2020-01-26 (×6): 1250 mg via INTRAVENOUS
  Filled 2020-01-24 (×7): qty 250

## 2020-01-24 MED ORDER — METOCLOPRAMIDE HCL 5 MG PO TABS: 5.0000 mg | ORAL_TABLET | Freq: Three times a day (TID) | ORAL | Status: DC | PRN

## 2020-01-24 MED ORDER — SODIUM CHLORIDE 0.9 % IV SOLN: INTRAVENOUS | Status: DC

## 2020-01-24 MED ORDER — IOHEXOL 350 MG/ML SOLN
100.0000 mL | Freq: Once | INTRAVENOUS | Status: AC | PRN
  Administered 2020-01-24: 100 mL via INTRAVENOUS

## 2020-01-24 NOTE — Progress Notes (Signed)
Pt was on yellow MEWS with Temp 101.9. Katherina Right, NP notified, new orders received. Initiated MEWS protocol.

## 2020-01-24 NOTE — Evaluation (Signed)
Occupational Therapy Evaluation Patient Details Name: Brent Lam MRN: [id number] DOB: 1956-06-07 Today's Date: 01/24/2020    History of Present Illness Pt is a 64 y.o. male with L 4th toe infection extending into forefoot. 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 Pt still with L foot cellulitis, potential for additional vascular intervention. PMH includes DM2, HTN.   Clinical Impression   PTA Pt independent in ADL and mobility, works as a Land. Pt today is min guard for donning socks EOB, will require assist for sit<>stand and managing clothing, min guard assist for in room mobility with RW, and will need further acute OT to maximize safety and independence in ADL and functional transfers. At this time recommending HHOT and 24 hour assist initially to focus not only on ADL - but IADL as Pt typically lives at home alone and does all his own cooking and cleaning. Pt did a good job maintaining NWB after initial cues, and will need continued OT in the acute setting to focus on ADL and functional transfers to include compensatory strategies and DME.     Follow Up Recommendations  Home health OT;Supervision/Assistance - 24 hour(initially)    Equipment Recommendations  3 in 1 bedside commode;Tub/shower seat    Recommendations for Other Services       Precautions / Restrictions Precautions Precautions: Fall;Other (comment) Precaution Comments: LLE portable wound vac Restrictions Weight Bearing Restrictions: Yes LLE Weight Bearing: Non weight bearing      Mobility Bed Mobility Overal bed mobility: Modified Independent             General bed mobility comments: HOB slightly elevated  Transfers Overall transfer level: Needs assistance Equipment used: Rolling walker (2 wheeled) Transfers: Sit to/from Stand Sit to Stand: Min guard         General transfer comment: Min guard from slightly elevated bed height; cues for LLE NWB and hand placenent    Balance Overall  balance assessment: Needs assistance Sitting-balance support: Feet supported Sitting balance-Leahy Scale: Fair       Standing balance-Leahy Scale: Poor Standing balance comment: Reliant on UE support                           ADL either performed or assessed with clinical judgement   ADL Overall ADL's : Needs assistance/impaired Eating/Feeding: Set up;Sitting   Grooming: Set up;Sitting   Upper Body Bathing: Set up;Sitting   Lower Body Bathing: Min guard;Sitting/lateral leans   Upper Body Dressing : Minimal assistance;Sitting Upper Body Dressing Details (indicate cue type and reason): to don hospital gown Lower Body Dressing: Moderate assistance;Sit to/from stand Lower Body Dressing Details (indicate cue type and reason): able to don socks min guard sitting EOB will need assist for items going from sitting>stand (i.e underwear or pants/shorts) Toilet Transfer: Minimal assistance;Ambulation;RW Toilet Transfer Details (indicate cue type and reason): vc for safe hand placement with RW, min A for inital boost and steady Toileting- Clothing Manipulation and Hygiene: Min guard;Sitting/lateral lean       Functional mobility during ADLs: Min guard;Rolling walker;Cueing for sequencing       Vision Patient Visual Report: No change from baseline       Perception     Praxis      Pertinent Vitals/Pain Pain Assessment: 0-10 Pain Score: 2  Pain Location: L foot Pain Descriptors / Indicators: Discomfort Pain Intervention(s): Monitored during session     Hand Dominance     Extremity/Trunk Assessment Upper Extremity  Assessment Upper Extremity Assessment: Overall WFL for tasks assessed(hx of R shoulder sx)   Lower Extremity Assessment Lower Extremity Assessment: LLE deficits/detail LLE Deficits / Details: s/p L transmet amputation; L hip and knee functionally at least 3/5 throughout   Cervical / Trunk Assessment Cervical / Trunk Assessment: Normal    Communication Communication Communication: No difficulties   Cognition Arousal/Alertness: Awake/alert Behavior During Therapy: WFL for tasks assessed/performed;Flat affect Overall Cognitive Status: Within Functional Limits for tasks assessed                                 General Comments: Recently received ativan   General Comments  Initiated educ on importance of ROM, edema control/elevation, d/c planning with potential for additional vascular sx    Exercises Other Exercises Other Exercises: Gentle hip and knee flex   Shoulder Instructions      Home Living Family/patient expects to be discharged to:: Private residence Living Arrangements: Alone Available Help at Discharge: Friend(s);Available 24 hours/day Type of Home: House Home Access: Stairs to enter [employer] of Steps: 3 Entrance Stairs-Rails: Right Home Layout: One level     Bathroom Shower/Tub: Walk-in shower;Tub/shower unit   Bathroom Toilet: Standard Bathroom Accessibility: Yes   Home Equipment: Hand held shower head          Prior Functioning/Environment Level of Independence: Independent        Comments: Tobacco farmer        OT Problem List: Decreased range of motion;Decreased activity tolerance;Impaired balance (sitting and/or standing);Decreased knowledge of use of DME or AE;Decreased knowledge of precautions;Pain      OT Treatment/Interventions: Self-care/ADL training;DME and/or AE instruction;Therapeutic activities;Cognitive remediation/compensation;Patient/family education;Balance training    OT Goals(Current goals can be found in the care plan section) Acute Rehab OT Goals Patient Stated Goal: Return home OT Goal Formulation: With patient/family Time For Goal Achievement: 02/07/20 Potential to Achieve Goals: Good ADL Goals Pt Will Perform Lower Body Bathing: with modified independence;sitting/lateral leans Pt Will Perform Lower Body Dressing: sit to/from  stand;with supervision Pt Will Transfer to Toilet: with modified independence;ambulating Pt Will Perform Toileting - Clothing Manipulation and hygiene: with modified independence;sitting/lateral leans Pt Will Perform Tub/Shower Transfer: with supervision;ambulating;shower seat;rolling walker  OT Frequency: Min 2X/week   Barriers to D/C:            Co-evaluation PT/OT/SLP Co-Evaluation/Treatment: Yes Reason for Co-Treatment: For patient/therapist safety;To address functional/ADL transfers PT goals addressed during session: Mobility/safety with mobility;Balance;Proper use of DME OT goals addressed during session: ADL's and self-care;Proper use of Adaptive equipment and DME      AM-PAC OT "6 Clicks" Daily Activity     Outcome Measure Help from another person eating meals?: A Little Help from another person taking care of personal grooming?: A Little Help from another person toileting, which includes using toliet, bedpan, or urinal?: A Lot Help from another person bathing (including washing, rinsing, drying)?: A Little Help from another person to put on and taking off regular upper body clothing?: A Little Help from another person to put on and taking off regular lower body clothing?: A Lot 6 Click Score: 16   End of Session Equipment Utilized During Treatment: Gait belt;Rolling walker Nurse Communication: Mobility status;Precautions;Weight bearing status  Activity Tolerance: Patient tolerated treatment well Patient left: in chair;with call bell/phone within reach;with chair alarm set;with family/visitor present  OT Visit Diagnosis: Unsteadiness on feet (R26.81);Other abnormalities of gait and mobility (R26.89);Pain Pain - Right/Left:  Left Pain - part of body: Ankle and joints of foot                Time: 9163-8466 OT Time Calculation (min): 25 min Charges:  OT General Charges $OT Visit: 1 Visit OT Evaluation $OT Eval Moderate Complexity: Hominy OTR/L Acute  Rehabilitation Services Pager: [phone] Office: [place] 01/24/2020, 1:27 PM

## 2020-01-24 NOTE — Evaluation (Signed)
Physical Therapy Evaluation Patient Details Name: Brent Lam MRN: [id number] DOB: [birth year] Today's Date: 01/24/2020   History of Present Illness  Pt is a 64 y.o. male with L 4th toe infection extending into forefoot. 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 Pt still with L foot cellulitis, potential for additional vascular intervention. PMH includes DM2, HTN.    Clinical Impression  Pt presents with an overall decrease in functional mobility secondary to above. PTA, pt independent, works as Systems developer, lives alone with supportive family/friends able to assist if needed. Educ on precautions, positioning, therex, and importance of mobility. Today, pt able to initiate transfer and gait training with RW; good ability to maintain LLE NWB precautions with intermittent cues. Pt would benefit from continued acute PT services to maximize functional mobility and independence prior to d/c with HHPT services; pending additional vascular intervention and progression with mobility.     Follow Up Recommendations Home health PT;Supervision for mobility/OOB    Equipment Recommendations  Rolling walker with 5" wheels;3in1 (PT);Wheelchair (measurements PT);Wheelchair cushion (measurements PT)    Recommendations for Other Services       Precautions / Restrictions Precautions Precautions: Fall;Other (comment) Precaution Comments: LLE portable wound vac Restrictions Weight Bearing Restrictions: Yes LLE Weight Bearing: Non weight bearing      Mobility  Bed Mobility Overal bed mobility: Modified Independent             General bed mobility comments: HOB slightly elevated  Transfers Overall transfer level: Needs assistance Equipment used: Rolling walker (2 wheeled) Transfers: Sit to/from Stand Sit to Stand: Min guard         General transfer comment: Min guard from slightly elevated bed height; cues for LLE NWB and hand placenent  Ambulation/Gait Ambulation/Gait assistance: Min  guard Gait Distance (Feet): 12 Feet Assistive device: Rolling walker (2 wheeled)   Gait velocity: Decreased   General Gait Details: Good ability to hop on RLE with RW and close min guard while maintaining LLE NWB; mild instability  Stairs            Wheelchair Mobility    Modified Rankin (Stroke Patients Only)       Balance Overall balance assessment: Needs assistance   Sitting balance-Leahy Scale: Fair       Standing balance-Leahy Scale: Poor Standing balance comment: Reliant on UE support                             Pertinent Vitals/Pain Pain Assessment: 0-10 Pain Score: 2  Pain Location: L foot Pain Descriptors / Indicators: Discomfort Pain Intervention(s): Monitored during session    Home Living Family/patient expects to be discharged to:: Private residence Living Arrangements: Alone Available Help at Discharge: Friend(s);Available 24 hours/day Type of Home: House Home Access: Stairs to enter Entrance Stairs-Rails: Right Entrance Stairs-Number of Steps: 3 Home Layout: One level Home Equipment: Hand held shower head      Prior Function Level of Independence: Independent         Comments: Tobacco farmer     Hand Dominance        Extremity/Trunk Assessment   Upper Extremity Assessment Upper Extremity Assessment: Overall WFL for tasks assessed(h/o R shoulder sx)    Lower Extremity Assessment Lower Extremity Assessment: LLE deficits/detail LLE Deficits / Details: s/p L transmet amputation; L hip and knee functionally at least 3/5 throughout       Communication   Communication: No difficulties  Cognition Arousal/Alertness: Awake/alert Behavior  During Therapy: WFL for tasks assessed/performed;Flat affect Overall Cognitive Status: Within Functional Limits for tasks assessed                                 General Comments: Recently received ativan      General Comments General comments (skin integrity,  edema, etc.): Initiated educ on importance of ROM, edema control/elevation, d/c planning with potential for additional vascular sx    Exercises Other Exercises Other Exercises: Gentle hip and knee flex   Assessment/Plan    PT Assessment Patient needs continued PT services  PT Problem List Decreased strength;Decreased activity tolerance;Decreased balance;Decreased mobility;Decreased knowledge of use of DME;Pain;Decreased knowledge of precautions       PT Treatment Interventions DME instruction;Gait training;Stair training;Functional mobility training;Therapeutic activities;Therapeutic exercise;Balance training;Patient/family education    PT Goals (Current goals can be found in the Care Plan section)  Acute Rehab PT Goals Patient Stated Goal: Return home PT Goal Formulation: With patient/family Time For Goal Achievement: 02/07/20 Potential to Achieve Goals: Good    Frequency Min 3X/week   Barriers to discharge        Co-evaluation PT/OT/SLP Co-Evaluation/Treatment: Yes Reason for Co-Treatment: To address functional/ADL transfers PT goals addressed during session: Mobility/safety with mobility;Balance;Proper use of DME;Strengthening/ROM         AM-PAC PT "6 Clicks" Mobility  Outcome Measure Help needed turning from your back to your side while in a flat bed without using bedrails?: None Help needed moving from lying on your back to sitting on the side of a flat bed without using bedrails?: None Help needed moving to and from a bed to a chair (including a wheelchair)?: A Little Help needed standing up from a chair using your arms (e.g., wheelchair or bedside chair)?: A Little Help needed to walk in hospital room?: A Little Help needed climbing 3-5 steps with a railing? : A Little 6 Click Score: 20    End of Session Equipment Utilized During Treatment: Gait belt Activity Tolerance: Patient tolerated treatment well Patient left: in chair;with call bell/phone within  reach;with family/visitor present Nurse Communication: Mobility status PT Visit Diagnosis: Other abnormalities of gait and mobility (R26.89);Pain Pain - Right/Left: Left Pain - part of body: Ankle and joints of foot    Time: 0254-2706 PT Time Calculation (min) (ACUTE ONLY): 25 min   Charges:   PT Evaluation $PT Eval Moderate Complexity: 1 Mod     Ina Homes, PT, DPT Acute Rehabilitation Services  Pager [phone] Office [phone]  Malachy Chamber 01/24/2020, 10:35 AM

## 2020-01-24 NOTE — Progress Notes (Signed)
Patient ID: Brent Lam, male   DOB: January 15, 1956, 64 y.o.   MRN: [id number] Postoperative day 1 left transmetatarsal amputation.  Patient has a good suction fit with the wound VAC.  No drainage.  Patient still has cellulitis dorsally on the left foot.  Initial deep tissue cultures are showing gram-positive cocci.  Margins were not clear and patient will need 4 weeks of antibiotics once cultures are finalized.  I discussed with the patient that while he had a dopplerable and palpable anterior tibial and dorsalis pedis pulse at the ankle there was no flow through the dorsalis pedis artery at the level of amputation.  Clinically patient has had a embolic or thrombotic event that completely occluded the dorsalis pedis artery distal to the ankle.  I have reviewed this with vascular vein surgery to see if there is any intervention options available.  Patient is on Lovenox.  Order written to start Trental.

## 2020-01-24 NOTE — Progress Notes (Signed)
Orthopedic Tech Progress Note Patient Details:  Brent Lam August 20, 1956 [id number]  Ortho Devices Type of Ortho Device: Postop shoe/boot Ortho Device/Splint Location: LLE Ortho Device/Splint Interventions: Ordered   Post Interventions Patient Tolerated: Well Instructions Provided: Care of device   Donald Pore 01/24/2020, 9:07 AM

## 2020-01-24 NOTE — Progress Notes (Signed)
PROGRESS NOTE    Brent Lam  CHE:[id number] DOB: 04/21/1956 DOA: 01/21/2020 PCP: Danella Penton, MD    Brief Narrative:  Patient was admitted to the hospital with the working diagnosis of left foot fourth toe osteomyelitis.  64 year old male presented with left toe infection.  Patient does have significant past medical history for type 2 diabetes mellitus and hypertension.  Patient reported 4 weeks of left foot great toe edema, erythema and tenderness.  Patient was treated as an outpatient with intravenous Rocephin x1 dose, unfortunately symptoms continued to get worse.  On his initial physical examination his blood pressure was 168/104, pulse rate 112, temperature 98.6, respiratory rate 29, oxygen saturation 98%, his lungs are clear to auscultation bilaterally, heart S1-S2 present rhythmic, his abdomen was soft.  Left foot fourth toe with round ulcerated lesion with erythema and edema. Left foot films with ulcerated lesion along the medial fourth toe with underlying osteomyelitis of the middle and distal phalanges and gangrenous soft tissue infection.   Patient was placed on broad-spectrum antibiotic therapy, intravenous fluids and orthopedic consultation.  Patient underwent a transmetatarsal amputation April 21.  During surgery he was noted no flow in the dorsalis pedis at the amputation level.  Vascular surgery was consulted, considering likely embolic phenomena.  Further work-up with echocardiography and CT angiography has been ordered.  Assessment & Plan:   Active Problems:   CKD (chronic kidney disease) stage 3, GFR 30-59 ml/min   HTN (hypertension)   DM2 (diabetes mellitus, type 2) (HCC)   Leukocytosis   Sepsis (HCC)   Osteomyelitis of toe of left foot (HCC)   Osteoarthritis of toe joint, left   Gangrene of toe of left foot (HCC)   PVD (peripheral vascular disease) (HCC)   1. Left foot right toe osteomyelitis, with dorsalis pedis artery embolism/ peripheral vascular disease.  Patient now sp transmetatarsal amputation. Will continue pain control and antibiotic therapy for now (IV vancomycin and ceftriaxone). Patient has a wound vac in place. Follow with vascular surgery recommendations for further work up with echocardiogram and CT angiography.   2. CKD stage 3b. Stable renal function with serum cr at 1,13 with K at 4,0 and serum bicarbonate at 20.   3. T2DM Fasting glucose is 142, will continue glucose cover and monitoring with insulin sliding scale.   4. HTN. Continue blood pressure control with metoprolol.   5. Anxiety. Continue with as needed lorazepam.     DVT prophylaxis: Enoxaparin   Code Status:   full  Family Communication:  I spoke with patient's family at the bedside, we talked in detail about patient's condition, plan of care and prognosis and all questions were addressed.   Disposition Plan:   Patient is from:  Home   Anticipated DC to:  Home   Anticipated DC date:  To be determined  Anticipated DC barriers: Pending vascular workup with cr angiography and echocardiogram.      Consultants:   Surgery orthopedic  Vascular surgery   Procedures:   Transmetatarsal amputation on the right   Antimicrobials:   ceftriaxone and vancomycin     Subjective: Patient with right foot pain controlled with analgesics, wound vac in place, no nausea or vomiting,   Objective: Vitals:   01/23/20 2046 01/24/20 0210 01/24/20 0321 01/24/20 0549  BP: (!) 141/77 (!) 162/82 (!) 152/79 (!) 142/80  Pulse: 91 (!) 107 (!) 106 89  Resp: 17 18 18 18   Temp: 98.6 F (37 C) (!) 101.9 F (38.8 C) 100.1 F (  37.8 C) 98.4 F (36.9 C)  TempSrc: Oral Oral Oral Oral  SpO2: 100% 94% 94% 96%  Weight:      Height:        Intake/Output Summary (Last 24 hours) at 01/24/2020 1547 Last data filed at 01/24/2020 0503 Gross per 24 hour  Intake 2260 ml  Output 1525 ml  Net 735 ml   Filed Weights   01/21/20 1133 01/23/20 1448 01/23/20 1507  Weight: 95.3 kg 95.3 kg  95.3 kg    Examination:   General: Not in pain or dyspnea, deconditioned  Neurology: Awake and alert, non focal  E ENT: no pallor, no icterus, oral mucosa moist Cardiovascular: No JVD. S1-S2 present, rhythmic, no gallops, rubs, or murmurs. No lower extremity edema. Pulmonary: vesicular breath sounds bilaterally, adequate air movement, no wheezing, rhonchi or rales. Gastrointestinal. Abdomen with  no organomegaly, non tender, no rebound or guarding Skin. No rashes Musculoskeletal:right foot transmetatarsal amputation, wound vac in place.      Data Reviewed: I have personally reviewed following labs and imaging studies  CBC: Recent Labs  Lab 01/21/20 1127 01/21/20 1619 01/22/20 0435 01/24/20 0538  WBC 18.8* 16.3* 13.1* 12.6*  NEUTROABS 16.3*  --   --   --   HGB 15.2 14.5 13.3 12.8*  HCT 46.9 43.3 40.4 38.9*  MCV 92.3 91.9 90.6 90.0  PLT 232 206 210 142   Basic Metabolic Panel: Recent Labs  Lab 01/21/20 1127 01/21/20 1619 01/22/20 0435 01/24/20 0538  NA 134*  --  135 132*  K 4.4  --  3.7 4.0  CL 98  --  102 101  CO2 24  --  22 20*  GLUCOSE 190*  --  105* 142*  BUN 19  --  16 13  CREATININE 1.33* 1.11 1.10 1.13  CALCIUM 8.9  --  8.3* 8.1*   GFR: Estimated Creatinine Clearance: 80.2 mL/min (by C-G formula based on SCr of 1.13 mg/dL). Liver Function Tests: Recent Labs  Lab 01/21/20 1127  AST 12*  ALT 11  ALKPHOS 63  BILITOT 0.7  PROT 7.9  ALBUMIN 3.7   No results for input(s): LIPASE, AMYLASE in the last 168 hours. No results for input(s): AMMONIA in the last 168 hours. Coagulation Profile: No results for input(s): INR, PROTIME in the last 168 hours. Cardiac Enzymes: No results for input(s): CKTOTAL, CKMB, CKMBINDEX, TROPONINI in the last 168 hours. BNP (last 3 results) No results for input(s): PROBNP in the last 8760 hours. HbA1C: No results for input(s): HGBA1C in the last 72 hours. CBG: Recent Labs  Lab 01/23/20 1452 01/23/20 1810  01/23/20 2048 01/24/20 0749 01/24/20 1149  GLUCAP 112* 95 173* 118* 172*   Lipid Profile: No results for input(s): CHOL, HDL, LDLCALC, TRIG, CHOLHDL, LDLDIRECT in the last 72 hours. Thyroid Function Tests: No results for input(s): TSH, T4TOTAL, FREET4, T3FREE, THYROIDAB in the last 72 hours. Anemia Panel: No results for input(s): VITAMINB12, FOLATE, FERRITIN, TIBC, IRON, RETICCTPCT in the last 72 hours.    Radiology Studies: I have reviewed all of the imaging during this hospital visit personally     Scheduled Meds: . docusate sodium  100 mg Oral BID  . enoxaparin (LOVENOX) injection  40 mg Subcutaneous Q24H  . insulin aspart  0-15 Units Subcutaneous TID WC  . insulin aspart  0-5 Units Subcutaneous QHS  . metoprolol tartrate  50 mg Oral Daily   Continuous Infusions: . sodium chloride    . cefTRIAXone (ROCEPHIN)  IV Stopped (01/22/20 1809)  .  vancomycin 1,250 mg (01/24/20 1102)     LOS: 3 days        Linea Calles Annett Gula, MD

## 2020-01-24 NOTE — Care Management (Signed)
    Durable Medical Equipment  (From admission, onward)         Start     Ordered   01/24/20 1540  For home use only DME Walker rolling  Once    Question Answer Comment  Walker: With 5 Inch Wheels   Patient needs a walker to treat with the following condition Weakness      01/24/20 1539   01/24/20 1538  For home use only DME 3 n 1  Once     01/24/20 1539   01/24/20 1538  For home use only DME lightweight manual wheelchair with seat cushion  Once    Comments: Patient suffers from plan for diagnostic arteriogram and possible therapeutic intervention tomorrow which impairs their ability to perform daily activities like ambulating  in the home.  A cane  will not resolve  issue with performing activities of daily living. A wheelchair will allow patient to safely perform daily activities. Patient is not able to propel themselves in the home using a standard weight wheelchair due to plan for diagnostic arteriogram and possible therapeutic intervention tomorrow. Patient can self propel in the lightweight wheelchair. Length of need lifetime  Accessories: elevating leg rests (ELRs), wheel locks, extensions and anti-tippers.  Seat and back cushions   01/24/20 1539

## 2020-01-24 NOTE — Progress Notes (Signed)
Patient ID: Brent Lam, male   DOB: [birth year], 64 y.o.   MRN: [id number]  Progress Note    01/24/2020 3:13 PM 1 Day Post-Op  Subjective:   Comfortable.  Patient's wife is in the room with him.  VAC in place   Vitals:   01/24/20 0321 01/24/20 0549  BP: (!) 152/79 (!) 142/80  Pulse: (!) 106 89  Resp: 18 18  Temp: 100.1 F (37.8 C) 98.4 F (36.9 C)  SpO2: 94% 96%   Physical Exam: VAC in place.  Still has erythema on the dorsum of his foot.  Still with a palpable anterior tibial pulse above the ankle  CBC    Component Value Date/Time   WBC 12.6 (H) 01/24/2020 0538   RBC 4.32 01/24/2020 0538   HGB 12.8 (L) 01/24/2020 0538   HCT 38.9 (L) 01/24/2020 0538   PLT 243 01/24/2020 0538   MCV 90.0 01/24/2020 0538   MCH 29.6 01/24/2020 0538   MCHC 32.9 01/24/2020 0538   RDW 13.3 01/24/2020 0538   LYMPHSABS 1.0 01/21/2020 1127   MONOABS 1.3 (H) 01/21/2020 1127   EOSABS 0.1 01/21/2020 1127   BASOSABS 0.1 01/21/2020 1127    BMET    Component Value Date/Time   NA 132 (L) 01/24/2020 0538   K 4.0 01/24/2020 0538   CL 101 01/24/2020 0538   CO2 20 (L) 01/24/2020 0538   GLUCOSE 142 (H) 01/24/2020 0538   BUN 13 01/24/2020 0538   CREATININE 1.13 01/24/2020 0538   CALCIUM 8.1 (L) 01/24/2020 0538   GFRNONAA >60 01/24/2020 0538   GFRAA >60 01/24/2020 0538    INR No results found for: INR   Intake/Output Summary (Last 24 hours) at 01/24/2020 1513 Last data filed at 01/24/2020 0503 Gross per 24 hour  Intake 2260 ml  Output 1525 ml  Net 735 ml     Assessment/Plan:  64 y.o. male Dr. Audrie Lia findings at surgery noted with no flow in the dorsalis pedis at the amputation level.  I discussed this at length with the patient and his wife.  This is most consistent with embolus.  Will obtain echocardiogram to rule out cardiac source and CT angio chest abdomen pelvis to rule out proximal source.  Again discussed potential merit for arteriogram.  Could have a subtle SFA lesion which may be  responsible for embolic disease.  Explained that there is very little chance of improving flow to his foot.  He is very active and wishes any chance for improving flow.  We will plan for diagnostic arteriogram and possible therapeutic intervention tomorrow     Larina Earthly, MD [facility] [phone] 01/24/2020 3:13 PM

## 2020-01-24 NOTE — TOC Initial Note (Signed)
Transition of Care [facility]) - Initial/Assessment Note    Patient Details  Name: Brent Lam MRN: [id number] Date of Birth: 02-23-1956  [facility] of [facility]) CM/SW Contact:    Kingsley Plan, RN Phone Number: 01/24/2020, 4:00 PM  Clinical Narrative:                 Spoke to patient and two male visitors at bedside. Patient gave permission for NCM to discuss his plan of care in front of visitors when NCM asked visitors names they replied "friends".   Patient from home alone however his visitors will be assisting him at discharge. Discussed PT recommendations for HHPT. Provided patient with Medicare.giv list of home health agencies. NCM requested patient look at list and pick five agencies and NCM will call agencies to see if they are in network with his insurance and have staffing. NCM will check back with patient in am regarding choices.   NCM also discuss wheel chair , walker and 3 in1 . The DME will be ordered closer to discharge and delivered to room.   PAtient and visitors voiced understanding to all of above.   Expected Discharge Plan: Home w Home Health Services Barriers to Discharge: Continued Medical Work up   Patient Goals and CMS Choice Patient states their goals for this hospitalization and ongoing recovery are:: to return to home CMS Medicare.gov Compare Post Acute Care list provided to:: Patient Choice offered to / list presented to : Patient  Expected Discharge Plan and Services Expected Discharge Plan: Home w Home Health Services   Discharge Planning Services: CM Consult Post Acute Care Choice: Home Health, Durable Medical Equipment Living arrangements for the past 2 months: Single Family Home                 DME Arranged: 3-N-1, Community education officer wheelchair with seat cushion, Walker rolling         HH Arranged: PT          Prior Living Arrangements/Services Living arrangements for the past 2 months: Single Family Home Lives with:: Self Patient  language and need for interpreter reviewed:: Yes Do you feel safe going back to the place where you live?: Yes      Need for Family Participation in Patient Care: Yes (Comment) Care giver support system in place?: Yes (comment) Current home services: DME Criminal Activity/Legal Involvement Pertinent to Current Situation/Hospitalization: No - Comment as needed  Activities of Daily Living Home Assistive Devices/Equipment: CBG Meter ADL Screening (condition at time of admission) Patient's cognitive ability adequate to safely complete daily activities?: Yes Is the patient deaf or have difficulty hearing?: No Does the patient have difficulty seeing, even when wearing glasses/contacts?: No Does the patient have difficulty concentrating, remembering, or making decisions?: No Patient able to express need for assistance with ADLs?: Yes Does the patient have difficulty dressing or bathing?: No Independently performs ADLs?: Yes (appropriate for developmental age) Does the patient have difficulty walking or climbing stairs?: Yes Weakness of Legs: Left Weakness of Arms/Hands: None  Permission Sought/Granted                  Emotional Assessment Appearance:: Appears stated age Attitude/Demeanor/Rapport: Engaged Affect (typically observed): Accepting Orientation: : Oriented to Situation, Oriented to  Time, Oriented to Place, Oriented to Self Alcohol / Substance Use: Not Applicable Psych Involvement: No (comment)  Admission diagnosis:  Osteomyelitis of toe of left foot (HCC) [M86.9] Osteoarthritis of toe joint, left [M19.072] Sepsis, due to unspecified organism,  unspecified whether acute organ dysfunction present [facility]) [A41.9] Patient Active Problem List   Diagnosis Date Noted  . Gangrene of toe of left foot ([place])   . PVD (peripheral vascular disease) ([place])   . CKD (chronic kidney disease) stage 3, GFR 30-59 ml/min 01/21/2020  . HTN (hypertension) 01/21/2020  . DM2 (diabetes mellitus,  type 2) ([place]) 01/21/2020  . Leukocytosis 01/21/2020  . Sepsis ([place]) 01/21/2020  . Osteomyelitis of toe of left foot ([place]) 01/21/2020  . Osteoarthritis of toe joint, left 01/21/2020   PCP:  Rusty Aus, MD Pharmacy:   [id number] - [place] [place] [address] Phone: [phone] Fax: [phone]  [facility] [address] [place] [address] Phone: [phone] Fax: [phone]  [facility] [phone] - [place], [place] [address] [place] [address] [place] [address] Phone: [phone] Fax: [phone]     Social Determinants of Health (SDOH) Interventions    Readmission Risk Interventions No flowsheet data found.

## 2020-01-25 ENCOUNTER — Inpatient Hospital Stay (HOSPITAL_COMMUNITY): Payer: BC Managed Care – PPO

## 2020-01-25 DIAGNOSIS — M19072 Primary osteoarthritis, left ankle and foot: Secondary | ICD-10-CM

## 2020-01-25 DIAGNOSIS — I7389 Other specified peripheral vascular diseases: Secondary | ICD-10-CM

## 2020-01-25 LAB — CBC
HCT: 37 % — ABNORMAL LOW (ref 39.0–52.0)
Hemoglobin: 12.5 g/dL — ABNORMAL LOW (ref 13.0–17.0)
MCH: 30.3 pg (ref 26.0–34.0)
MCHC: 33.8 g/dL (ref 30.0–36.0)
MCV: 89.8 fL (ref 80.0–100.0)
Platelets: 245 10*3/uL (ref 150–400)
RBC: 4.12 MIL/uL — ABNORMAL LOW (ref 4.22–5.81)
RDW: 13.3 % (ref 11.5–15.5)
WBC: 12 10*3/uL — ABNORMAL HIGH (ref 4.0–10.5)
nRBC: 0 % (ref 0.0–0.2)

## 2020-01-25 LAB — BASIC METABOLIC PANEL
Anion gap: 9 (ref 5–15)
BUN: 17 mg/dL (ref 8–23)
CO2: 23 mmol/L (ref 22–32)
Calcium: 8.2 mg/dL — ABNORMAL LOW (ref 8.9–10.3)
Chloride: 103 mmol/L (ref 98–111)
Creatinine, Ser: 1.05 mg/dL (ref 0.61–1.24)
GFR calc Af Amer: >60 mL/min (ref >60)
GFR calc non Af Amer: >60 mL/min (ref >60)
Glucose, Bld: 94 mg/dL (ref 70–99)
Potassium: 4 mmol/L (ref 3.5–5.1)
Sodium: 135 mmol/L (ref 135–145)

## 2020-01-25 LAB — ECHOCARDIOGRAM COMPLETE
Height: 72.008 in
Weight: 3359.99 oz

## 2020-01-25 LAB — GLUCOSE, CAPILLARY
Glucose-Capillary: 98 mg/dL (ref 70–99)
Glucose-Capillary: 169 mg/dL — ABNORMAL HIGH (ref 70–99)
Glucose-Capillary: 126 mg/dL — ABNORMAL HIGH (ref 70–99)
Glucose-Capillary: 157 mg/dL — ABNORMAL HIGH (ref 70–99)

## 2020-01-25 MED ORDER — LORAZEPAM 1 MG PO TABS
1.0000 mg | ORAL_TABLET | Freq: Three times a day (TID) | ORAL | Status: DC | PRN
  Administered 2020-01-26 – 2020-01-28 (×4): 1 mg via ORAL
  Filled 2020-01-25: qty 1
  Filled 2020-01-25 (×3): qty 2

## 2020-01-25 NOTE — Progress Notes (Signed)
PROGRESS NOTE    Brent Lam  ZDG:[id number] DOB: 15-Aug-1956 DOA: 01/21/2020 PCP: Danella Penton, MD    Brief Narrative:  Patient was admitted to the hospital with the working diagnosis of left foot fourth toe osteomyelitis.  64 year old male presented with left toe infection.  Patient does have significant past medical history for type 2 diabetes mellitus and hypertension.  Patient reported 4 weeks of left foot great toe edema, erythema and tenderness.  Patient was treated as an outpatient with intravenous Rocephin x1 dose, unfortunately symptoms continued to get worse.  On his initial physical examination his blood pressure was 168/104, pulse rate 112, temperature 98.6, respiratory rate 29, oxygen saturation 98%, his lungs are clear to auscultation bilaterally, heart S1-S2 present rhythmic, his abdomen was soft.  Left foot fourth toe with round ulcerated lesion with erythema and edema. Left foot films with ulcerated lesion along the medial fourth toe with underlying osteomyelitis of the middle and distal phalanges and gangrenous soft tissue infection.   Patient was placed on broad-spectrum antibiotic therapy, intravenous fluids and orthopedic consultation.  Patient underwent a transmetatarsal amputation April 21.  During surgery he was noted no flow in the dorsalis pedis at the amputation level.  Vascular surgery was consulted, considering likely embolic phenomena.  Further work-up with echocardiography and CT angiography has been ordered.   Assessment & Plan:   Principal Problem:   Osteomyelitis of toe of left foot (HCC) Active Problems:   CKD (chronic kidney disease) stage 3, GFR 30-59 ml/min   HTN (hypertension)   DM2 (diabetes mellitus, type 2) (HCC)   Leukocytosis   Sepsis (HCC)   Osteoarthritis of toe joint, left   Gangrene of toe of left foot (HCC)   PVD (peripheral vascular disease) (HCC)    1. Left foot right toe osteomyelitis, with dorsalis pedis artery embolism/  peripheral vascular disease.  sp transmetatarsal amputation. Reported not clear margins and need 4 more weeks of antibiotic therapy per Dr. Lajoyce Corners recommendations. While hospitalized wiill continue with IV vancomycin and ceftriaxone. For discharge will check with orthopedic surgery if he can be transitioned to oral antibiotic therapy.  Continue wound care and wound vac. CT angiography with no thrombotic event, follow with echocardiogram. Pending final vascular surgery recommendations.   Patient will need home health per OT recommendations, with rolling walker.   Pain control with tramadol, oxycodone and hydromorphone.   2. CKD stage 3b. Renal function with serum cr at 1,05 with K at 4,0 and serum bicarbonate at 23. Patient tolerating po well, continue to hold on IV fluids for now.   3. T2DM Fasting glucose is 94, on glucose cover and monitoring with insulin sliding scale. Tolerating po well.   4. HTN. On metoprolol for blood pressure control  5. Anxiety. Continue PRN lorazepam change to po from IV    DVT prophylaxis:      Enoxaparin   Code Status:              full  Family Communication:       I spoke with patient's family at the bedside, we talked in detail about patient's condition, plan of care and prognosis and all questions were addressed.   Disposition Plan:              Patient is from:                        Home  Anticipated DC to:                   Home              Anticipated DC date:               To be determined             Anticipated DC barriers:         Pending vascular workup with cr angiography and echocardiogram.              Consultants:   Surgery orthopedic  Vascular surgery   Procedures:   Transmetatarsal amputation on the right   Antimicrobials:   ceftriaxone and vancomycin      Subjective: Patient is feeling better, no significant pain at the wound site, no nausea or vomiting. Out of bed to chair.   Objective: Vitals:    01/24/20 0321 01/24/20 0549 01/24/20 2051 01/25/20 0531  BP: (!) 152/79 (!) 142/80 136/70 (!) 147/79  Pulse: (!) 106 89 85 88  Resp: 18 18 17 19   Temp: 100.1 F (37.8 C) 98.4 F (36.9 C) 98.1 F (36.7 C) 97.9 F (36.6 C)  TempSrc: Oral Oral Oral Oral  SpO2: 94% 96% 95% 96%  Weight:      Height:        Intake/Output Summary (Last 24 hours) at 01/25/2020 1337 Last data filed at 01/25/2020 1100 Gross per 24 hour  Intake 433.86 ml  Output 1625 ml  Net -1191.14 ml   Filed Weights   01/21/20 1133 01/23/20 1448 01/23/20 1507  Weight: 95.3 kg 95.3 kg 95.3 kg    Examination:   General: Not in pain or dyspnea, Neurology: Awake and alert, non focal  E ENT: no pallor, no icterus, oral mucosa moist Cardiovascular: No JVD. S1-S2 present, rhythmic, no gallops, rubs, or murmurs. No lower extremity edema. Pulmonary: vesicular breath sounds bilaterally, adequate air movement, no wheezing, rhonchi or rales. Gastrointestinal. Abdomen with, no organomegaly, non tender, no rebound or guarding Skin. Wound vac in place.  Musculoskeletal: transmetatarsal amputation on the left foot.      Data Reviewed: I have personally reviewed following labs and imaging studies  CBC: Recent Labs  Lab 01/21/20 1127 01/21/20 1619 01/22/20 0435 01/24/20 0538 01/25/20 0239  WBC 18.8* 16.3* 13.1* 12.6* 12.0*  NEUTROABS 16.3*  --   --   --   --   HGB 15.2 14.5 13.3 12.8* 12.5*  HCT 46.9 43.3 40.4 38.9* 37.0*  MCV 92.3 91.9 90.6 90.0 89.8  PLT 232 206 210 243 245   Basic Metabolic Panel: Recent Labs  Lab 01/21/20 1127 01/21/20 1619 01/22/20 0435 01/24/20 0538 01/25/20 0239  NA 134*  --  135 132* 135  K 4.4  --  3.7 4.0 4.0  CL 98  --  102 101 103  CO2 24  --  22 20* 23  GLUCOSE 190*  --  105* 142* 94  BUN 19  --  16 13 17   CREATININE 1.33* 1.11 1.10 1.13 1.05  CALCIUM 8.9  --  8.3* 8.1* 8.2*   GFR: Estimated Creatinine Clearance: 86.3 mL/min (by C-G formula based on SCr of 1.05  mg/dL). Liver Function Tests: Recent Labs  Lab 01/21/20 1127  AST 12*  ALT 11  ALKPHOS 63  BILITOT 0.7  PROT 7.9  ALBUMIN 3.7   No results for input(s): LIPASE, AMYLASE in the last 168 hours. No results for input(s): AMMONIA in the last  168 hours. Coagulation Profile: No results for input(s): INR, PROTIME in the last 168 hours. Cardiac Enzymes: No results for input(s): CKTOTAL, CKMB, CKMBINDEX, TROPONINI in the last 168 hours. BNP (last 3 results) No results for input(s): PROBNP in the last 8760 hours. HbA1C: No results for input(s): HGBA1C in the last 72 hours. CBG: Recent Labs  Lab 01/24/20 1149 01/24/20 1656 01/24/20 2048 01/25/20 0726 01/25/20 1133  GLUCAP 172* 142* 120* 98 169*   Lipid Profile: No results for input(s): CHOL, HDL, LDLCALC, TRIG, CHOLHDL, LDLDIRECT in the last 72 hours. Thyroid Function Tests: No results for input(s): TSH, T4TOTAL, FREET4, T3FREE, THYROIDAB in the last 72 hours. Anemia Panel: No results for input(s): VITAMINB12, FOLATE, FERRITIN, TIBC, IRON, RETICCTPCT in the last 72 hours.    Radiology Studies: I have reviewed all of the imaging during this hospital visit personally     Scheduled Meds: . docusate sodium  100 mg Oral BID  . enoxaparin (LOVENOX) injection  40 mg Subcutaneous Q24H  . insulin aspart  0-15 Units Subcutaneous TID WC  . insulin aspart  0-5 Units Subcutaneous QHS  . metoprolol tartrate  50 mg Oral Daily   Continuous Infusions: . sodium chloride    . cefTRIAXone (ROCEPHIN)  IV 200 mL/hr at 01/24/20 1600  . vancomycin 1,250 mg (01/25/20 1001)     LOS: 4 days        Erina Hamme Gerome Apley, MD

## 2020-01-25 NOTE — Progress Notes (Signed)
Pharmacy Antibiotic Note  Brent Lam is a 64 y.o. male admitted on 01/21/2020 with osteomyelitis of L toe.  Pharmacy has been consulted for vancomycin dosing   Pt is now s/p TMA. Wound culture is growing out staph aureus with sens pending. Plan for 4 wks of PO abx sens are back. We will hold off level until then.   Scr 1.05 Goal trough 15-20  Plan:  Cont vancomycin 1250 mg IV q12   SCr q48 while on vanc  Rocephin per MD; dosing appropriate  Height: 6' 0.01" (182.9 cm) Weight: 95.3 kg (210 lb) IBW/kg (Calculated) : 77.62  Temp (24hrs), Avg:98 F (36.7 C), Min:97.9 F (36.6 C), Max:98.1 F (36.7 C)  Recent Labs  Lab 01/21/20 1126 01/21/20 1127 01/21/20 1619 01/22/20 0435 01/24/20 0538 01/25/20 0239  WBC  --  18.8* 16.3* 13.1* 12.6* 12.0*  CREATININE  --  1.33* 1.11 1.10 1.13 1.05  LATICACIDVEN 1.1  --   --   --   --   --     Estimated Creatinine Clearance: 86.3 mL/min (by C-G formula based on SCr of 1.05 mg/dL).    No Known Allergies   Dose adjustments this admission: 4/20: incr vanc to 1250 q12 hr with improved renal function  Microbiology results: 4/19 BCx: ngtd 4/21 wound tissue>> staph aureus  Ulyses Southward, PharmD, BCIDP, AAHIVP, CPP Infectious Disease Pharmacist 01/25/2020 11:17 AM

## 2020-01-25 NOTE — Progress Notes (Signed)
  Echocardiogram 2D Echocardiogram has been performed.  Gerda Diss 01/25/2020, 2:31 PM

## 2020-01-25 NOTE — Procedures (Signed)
Echo attempted. Patient with PT and then wanted to eat. Will attempt echo later.

## 2020-01-25 NOTE — Progress Notes (Signed)
Physical Therapy Treatment Patient Details Name: Brent Lam MRN: [id number] DOB: [birth year] Today's Date: 01/25/2020    History of Present Illness Pt is a 64 y.o. male with L 4th toe infection extending into forefoot. 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 Pt still with L foot cellulitis, potential for additional vascular intervention. PMH includes DM2, HTN.   PT Comments    Pt progressing well with mobility. Able to progress transfer and gait training with RW and min guard for balance with great ability to maintain LLE NWB precautions; limited by fatigue, generalized weakness and decreased activity tolerance. Reviewed seated and supine LE therex. Pt motivated to participate and return home.    Follow Up Recommendations  Home health PT;Supervision for mobility/OOB     Equipment Recommendations  Rolling walker with 5" wheels;3in1 (PT);Wheelchair (measurements PT);Wheelchair cushion (measurements PT)    Recommendations for Other Services       Precautions / Restrictions Precautions Precautions: Fall;Other (comment) Precaution Comments: LLE portable wound vac Restrictions Weight Bearing Restrictions: Yes LLE Weight Bearing: Non weight bearing    Mobility  Bed Mobility Overal bed mobility: Modified Independent             General bed mobility comments: HOB slightly elevated  Transfers Overall transfer level: Needs assistance Equipment used: Rolling walker (2 wheeled) Transfers: Sit to/from Stand Sit to Stand: Min guard         General transfer comment: Min guard standing from low bed height to RW; performed 5x sit<>stand from recliner, 1x cues for hand placement, good ability to maintain LLE NWB; min guard for balance; heavy reliance on UE support  Ambulation/Gait Ambulation/Gait assistance: Min guard Gait Distance (Feet): 36 Feet Assistive device: Rolling walker (2 wheeled)   Gait velocity: Decreased   General Gait Details: Good ability to hop on RLE with RW  and close min guard while maintaining LLE NWB; mild instability; declined further distance secondary to fatigue   Stairs             Wheelchair Mobility    Modified Rankin (Stroke Patients Only)       Balance Overall balance assessment: Needs assistance Sitting-balance support: Feet supported Sitting balance-Leahy Scale: Good       Standing balance-Leahy Scale: Poor Standing balance comment: Reliant on UE support                            Cognition Arousal/Alertness: Awake/alert Behavior During Therapy: WFL for tasks assessed/performed;Flat affect Overall Cognitive Status: Within Functional Limits for tasks assessed                                        Exercises Other Exercises Other Exercises: BLE SLR, LLE LAQ, ankle ROM; pt with difficulty performing seated/supine hip flex (knee to chest motion) due to "kneecap pain"    General Comments        Pertinent Vitals/Pain Pain Assessment: Faces Faces Pain Scale: Hurts a little bit Pain Location: L foot Pain Descriptors / Indicators: Tightness;Discomfort Pain Intervention(s): Monitored during session    Home Living                      Prior Function            PT Goals (current goals can now be found in the care plan section) Progress towards PT goals: Progressing  toward goals    Frequency    Min 3X/week      PT Plan Current plan remains appropriate    Co-evaluation              AM-PAC PT "6 Clicks" Mobility   Outcome Measure  Help needed turning from your back to your side while in a flat bed without using bedrails?: None Help needed moving from lying on your back to sitting on the side of a flat bed without using bedrails?: None Help needed moving to and from a bed to a chair (including a wheelchair)?: A Little Help needed standing up from a chair using your arms (e.g., wheelchair or bedside chair)?: A Little Help needed to walk in hospital room?:  A Little Help needed climbing 3-5 steps with a railing? : A Little 6 Click Score: 20    End of Session Equipment Utilized During Treatment: Gait belt Activity Tolerance: Patient tolerated treatment well Patient left: in chair;with call bell/phone within reach Nurse Communication: Mobility status PT Visit Diagnosis: Other abnormalities of gait and mobility (R26.89);Pain Pain - Right/Left: Left     Time: 1204-1219 PT Time Calculation (min) (ACUTE ONLY): 15 min  Charges:  $Therapeutic Activity: 8-22 mins                    Ina Homes, PT, DPT Acute Rehabilitation Services  Pager [phone] Office [phone]  Malachy Chamber 01/25/2020, 1:18 PM

## 2020-01-26 LAB — BASIC METABOLIC PANEL
Anion gap: 11 (ref 5–15)
BUN: 16 mg/dL (ref 8–23)
CO2: 21 mmol/L — ABNORMAL LOW (ref 22–32)
Calcium: 8.5 mg/dL — ABNORMAL LOW (ref 8.9–10.3)
Chloride: 102 mmol/L (ref 98–111)
Creatinine, Ser: 1.02 mg/dL (ref 0.61–1.24)
GFR calc Af Amer: >60 mL/min (ref >60)
GFR calc non Af Amer: >60 mL/min (ref >60)
Glucose, Bld: 139 mg/dL — ABNORMAL HIGH (ref 70–99)
Potassium: 3.9 mmol/L (ref 3.5–5.1)
Sodium: 134 mmol/L — ABNORMAL LOW (ref 135–145)

## 2020-01-26 LAB — CBC
HCT: 40.4 % (ref 39.0–52.0)
Hemoglobin: 13.1 g/dL (ref 13.0–17.0)
MCH: 29.4 pg (ref 26.0–34.0)
MCHC: 32.4 g/dL (ref 30.0–36.0)
MCV: 90.8 fL (ref 80.0–100.0)
Platelets: 280 10*3/uL (ref 150–400)
RBC: 4.45 MIL/uL (ref 4.22–5.81)
RDW: 13.4 % (ref 11.5–15.5)
WBC: 13.2 10*3/uL — ABNORMAL HIGH (ref 4.0–10.5)
nRBC: 0 % (ref 0.0–0.2)

## 2020-01-26 LAB — CULTURE, BLOOD (ROUTINE X 2)
Culture: NO GROWTH
Culture: NO GROWTH
Special Requests: ADEQUATE

## 2020-01-26 LAB — GLUCOSE, CAPILLARY
Glucose-Capillary: 121 mg/dL — ABNORMAL HIGH (ref 70–99)
Glucose-Capillary: 156 mg/dL — ABNORMAL HIGH (ref 70–99)
Glucose-Capillary: 132 mg/dL — ABNORMAL HIGH (ref 70–99)
Glucose-Capillary: 160 mg/dL — ABNORMAL HIGH (ref 70–99)

## 2020-01-26 NOTE — Progress Notes (Addendum)
  Progress Note    01/26/2020 10:25 AM Hospital Day 4  Subjective:  Doing ok-wants his foot to heal.  Does not have much pain.  Tm 99.3  Vitals:   01/25/20 2121 01/26/20 0515  BP: (!) 150/73 (!) 151/71  Pulse: 92 89  Resp: 17 17  Temp: 99.3 F (37.4 C) 99.3 F (37.4 C)  SpO2: 94% 93%    Physical Exam: General:  No distress Lungs:  Non labored Extremities:  Left foot with palpable DP pulse; wound vac in place with good seal  CBC    Component Value Date/Time   WBC 13.2 (H) 01/26/2020 0842   RBC 4.45 01/26/2020 0842   HGB 13.1 01/26/2020 0842   HCT 40.4 01/26/2020 0842   PLT 280 01/26/2020 0842   MCV 90.8 01/26/2020 0842   MCH 29.4 01/26/2020 0842   MCHC 32.4 01/26/2020 0842   RDW 13.4 01/26/2020 0842   LYMPHSABS 1.0 01/21/2020 1127   MONOABS 1.3 (H) 01/21/2020 1127   EOSABS 0.1 01/21/2020 1127   BASOSABS 0.1 01/21/2020 1127    BMET    Component Value Date/Time   NA 134 (L) 01/26/2020 0842   K 3.9 01/26/2020 0842   CL 102 01/26/2020 0842   CO2 21 (L) 01/26/2020 0842   GLUCOSE 139 (H) 01/26/2020 0842   BUN 16 01/26/2020 0842   CREATININE 1.02 01/26/2020 0842   CALCIUM 8.5 (L) 01/26/2020 0842   GFRNONAA >60 01/26/2020 0842   GFRAA >60 01/26/2020 0842    INR No results found for: INR   Intake/Output Summary (Last 24 hours) at 01/26/2020 1025 Last data filed at 01/26/2020 0900 Gross per 24 hour  Intake 480 ml  Output 1950 ml  Net -1470 ml     Assessment/Plan:  64 y.o. male who is 3 days post op from left TMA Hospital Day 4  -Dr. Arbie Cookey saw pt a couple of days ago and d/w pt and his wife at length that this is most consistent with embolus.  2D echo was performed yesterday and there was no evidence for source of embolus.  Dr. Myra Gianotti discussed with pt arteriogram with LLE runoff on Monday to evaluate arterial flow.  Pt's questions answered and he is agreeable to proceed.    Doreatha Massed, PA-C [facility] [phone] 01/26/2020 10:25 AM   I agree with the above.  I have seen and evaluated the patient.  He has undergone CT angiogram of the chest abdomen pelvis as well as echocardiogram with no obvious source for embolism.  His amputation dressing is in place.  He has a palpable dorsalis pedis pulse.  I discussed with the plan now would be for angiography on Monday to see if there is a subtle lesion that could be the source of embolic disease.  He will need to be n.p.o. after midnight on Sunday for his procedure on Monday.  Well Merrily Tegeler

## 2020-01-26 NOTE — Progress Notes (Signed)
PROGRESS NOTE    CLEVLAND CORK  UVO:[id number] DOB: [birth year] DOA: 01/21/2020 PCP: Rusty Aus, MD    Brief Narrative:  Patient was admitted to the hospital with the working diagnosis of left footfourthtoe osteomyelitis.  64 year old male presented with left toe infection. Patient does have significant past medical history for type 2 diabetes mellitus and hypertension. Patient reported 4 weeks of left foot great toe edema, erythema and tenderness. Patient was treated as an outpatient with intravenous Rocephin x1 dose, unfortunately symptoms continued to get worse.On his initial physical examination his blood pressure was 168/104, pulse rate 112, temperature 98.6, respiratory rate 29, oxygen saturation 98%, his lungs are clear to auscultation bilaterally, heart S1-S2 present rhythmic, his abdomen was soft.Left foot fourth toe with round ulcerated lesion with erythema and edema. Left foot films with ulcerated lesion along the medial fourth toe with underlying osteomyelitis of the middle and distal phalanges and gangrenous soft tissue infection.  Patient was placed on broad-spectrum antibiotic therapy, intravenous fluids and orthopedic consultation. Patient underwent a transmetatarsal amputation April 21.  During surgery he was noted no flow in the dorsalis pedis at the amputation level. Vascular surgery was consulted, considering likely embolic phenomena. Further work-up with echocardiography and CT angiography has been ordered, with no embolic source.  Patient scheduled for left lower extremity runoff arteriogram on 4/26.    Assessment & Plan:   Principal Problem:   Osteomyelitis of toe of left foot (HCC) Active Problems:   CKD (chronic kidney disease) stage 3, GFR 30-59 ml/min   HTN (hypertension)   DM2 (diabetes mellitus, type 2) (HCC)   Leukocytosis   Sepsis (HCC)   Osteoarthritis of toe joint, left   Gangrene of toe of left foot (HCC)   PVD (peripheral vascular  disease) ([place])   1. Left foot right toe osteomyelitis, with dorsalis pedis artery embolism/ peripheral vascular disease.  sp transmetatarsal amputation. Reported not clear margins and need 4 more weeks of antibiotic therapy per Dr. Sharol Given recommendations. Wbc is 13 today, wound culture positive for staphylococcus MSSA and enterococcus.   Will change antibiotic therapy to ceftriaxone, continue to follow cell count and temperature curve. Continue local wound care and wound vac.   Continue pain control with tramadol, oxycodone and hydromorphone.   Echocardiogram and CT angiography with no frank thrombosis, plan for left lower extremity arteriogram on Monday.   2. CKD stage 3b. Stable renal function with serum cr at 1,02 with K at 3,9 and serum bicarbonate at 21. Continue to hold on IV fluids.  3. T2DM Fasting glucose is 139, continue  glucose cover and monitoring with insulin sliding scale. Tolerating po well.   4. HTN. Continue with metoprolol for blood pressure control  5. Anxiety. Continue PO PRN lorazepam, with good toleration.      DVT prophylaxis:Enoxaparin Code Status:full Family Communication:I spoke with patient'sfamilyat the bedside, we talked in detail about patient's condition, plan of care and prognosis and all questions were addressed.  Disposition Plan: Patient is from:Home Anticipated DC VQ:[id number] Anticipated DC date:To be determined Anticipated DC barriers:Pending vascular workup with cr angiography and echocardiogram.    Consultants:  Surgery orthopedic  Vascular surgery  Procedures:  Transmetatarsal amputation on the right  Antimicrobials:  ceftriaxone and vancomycin  Subjective: Patient with mild pain at the right foot wound, no nausea or vomiting, no chest pain or dyspnea, he is  out of bed to chair.   Objective: Vitals:   01/25/20 0531 01/25/20 1627 01/25/20 2121 01/26/20 0515  BP: Marland Kitchen)  147/79 136/72 (!) 150/73 (!) 151/71  Pulse: 88 85 92 89  Resp: 19 18 17 17   Temp: 97.9 F (36.6 C) 98.2 F (36.8 C) 99.3 F (37.4 C) 99.3 F (37.4 C)  TempSrc: Oral Oral Oral Oral  SpO2: 96% 92% 94% 93%  Weight:      Height:        Intake/Output Summary (Last 24 hours) at 01/26/2020 1329 Last data filed at 01/26/2020 0900 Gross per 24 hour  Intake 360 ml  Output 1850 ml  Net -1490 ml   Filed Weights   01/21/20 1133 01/23/20 1448 01/23/20 1507  Weight: 95.3 kg 95.3 kg 95.3 kg    Examination:   General: Not in pain or dyspnea,  Neurology: Awake and alert, non focal  E ENT: mild pallor, no icterus, oral mucosa moist Cardiovascular: No JVD. S1-S2 present, rhythmic, no gallops, rubs, or murmurs. No lower extremity edema. Pulmonary: vesicular breath sounds bilaterally, adequate air movement, no wheezing, rhonchi or rales. Gastrointestinal. Abdomen with no organomegaly, non tender, no rebound or guarding Skin. No rashes Musculoskeletal: right transmetatarsal amputation with wound vac in place.      Data Reviewed: I have personally reviewed following labs and imaging studies  CBC: Recent Labs  Lab 01/21/20 1127 01/21/20 1127 01/21/20 1619 01/22/20 0435 01/24/20 0538 01/25/20 0239 01/26/20 0842  WBC 18.8*   < > 16.3* 13.1* 12.6* 12.0* 13.2*  NEUTROABS 16.3*  --   --   --   --   --   --   HGB 15.2   < > 14.5 13.3 12.8* 12.5* 13.1  HCT 46.9   < > 43.3 40.4 38.9* 37.0* 40.4  MCV 92.3   < > 91.9 90.6 90.0 89.8 90.8  PLT 232   < > 206 210 243 245 280   < > = values in this interval not displayed.   Basic Metabolic Panel: Recent Labs  Lab 01/21/20 1127 01/21/20 1127 01/21/20 1619 01/22/20 0435 01/24/20 0538 01/25/20 0239 01/26/20 0842  NA 134*  --   --  135 132* 135 134*  K 4.4  --   --  3.7 4.0 4.0 3.9  CL 98  --   --  102 101 103 102  CO2 24  --    --  22 20* 23 21*  GLUCOSE 190*  --   --  105* 142* 94 139*  BUN 19  --   --  16 13 17 16   CREATININE 1.33*   < > 1.11 1.10 1.13 1.05 1.02  CALCIUM 8.9  --   --  8.3* 8.1* 8.2* 8.5*   < > = values in this interval not displayed.   GFR: Estimated Creatinine Clearance: 88.8 mL/min (by C-G formula based on SCr of 1.02 mg/dL). Liver Function Tests: Recent Labs  Lab 01/21/20 1127  AST 12*  ALT 11  ALKPHOS 63  BILITOT 0.7  PROT 7.9  ALBUMIN 3.7   No results for input(s): LIPASE, AMYLASE in the last 168 hours. No results for input(s): AMMONIA in the last 168 hours. Coagulation Profile: No results for input(s): INR, PROTIME in the last 168 hours. Cardiac Enzymes: No results for input(s): CKTOTAL, CKMB, CKMBINDEX, TROPONINI in the last 168 hours. BNP (last 3 results) No results for input(s): PROBNP in the last 8760 hours. HbA1C: No results for input(s): HGBA1C in the last 72 hours. CBG: Recent Labs  Lab 01/25/20 1133 01/25/20 1628 01/25/20 2117 01/26/20 0744 01/26/20 1203  GLUCAP 169* 126* 157* 121*  156*   Lipid Profile: No results for input(s): CHOL, HDL, LDLCALC, TRIG, CHOLHDL, LDLDIRECT in the last 72 hours. Thyroid Function Tests: No results for input(s): TSH, T4TOTAL, FREET4, T3FREE, THYROIDAB in the last 72 hours. Anemia Panel: No results for input(s): VITAMINB12, FOLATE, FERRITIN, TIBC, IRON, RETICCTPCT in the last 72 hours.    Radiology Studies: I have reviewed all of the imaging during this hospital visit personally     Scheduled Meds: . docusate sodium  100 mg Oral BID  . enoxaparin (LOVENOX) injection  40 mg Subcutaneous Q24H  . insulin aspart  0-15 Units Subcutaneous TID WC  . insulin aspart  0-5 Units Subcutaneous QHS  . metoprolol tartrate  50 mg Oral Daily   Continuous Infusions: . cefTRIAXone (ROCEPHIN)  IV 2 g (01/25/20 1623)  . vancomycin 1,250 mg (01/26/20 1109)     LOS: 5 days        Johnedward Brodrick Annett Gula, MD

## 2020-01-26 NOTE — Progress Notes (Signed)
OT Cancellation Note  Patient Details Name: Brent Lam MRN: [id number] DOB: June 26, 1956   Cancelled Treatment:     Patient refused visit in AM secondary he was tired. Reattempted later in morning and patient nurse stated he had bad news and did not want therapy today and patient agreed.   Kayler Rise 01/26/2020, 3:52 PM

## 2020-01-27 LAB — GLUCOSE, CAPILLARY
Glucose-Capillary: 118 mg/dL — ABNORMAL HIGH (ref 70–99)
Glucose-Capillary: 153 mg/dL — ABNORMAL HIGH (ref 70–99)
Glucose-Capillary: 188 mg/dL — ABNORMAL HIGH (ref 70–99)

## 2020-01-27 MED ORDER — AMOXICILLIN-POT CLAVULANATE 875-125 MG PO TABS
1.0000 | ORAL_TABLET | Freq: Two times a day (BID) | ORAL | Status: DC
  Administered 2020-01-27 – 2020-01-29 (×5): 1 via ORAL
  Filled 2020-01-27 (×5): qty 1

## 2020-01-27 NOTE — Progress Notes (Signed)
PROGRESS NOTE    ETHEL VERONICA  EVO:[id number] DOB: 1955/11/12 DOA: 01/21/2020 PCP: Rusty Aus, MD    Brief Narrative:  Patient was admitted to the hospital with the working diagnosis of left footfourthtoe osteomyelitis.  64 year old male presented with left toe infection. Patient does have significant past medical history for type 2 diabetes mellitus and hypertension. Patient reported 4 weeks of left foot great toe edema, erythema and tenderness. Patient was treated as an outpatient with intravenous Rocephin x1 dose, unfortunately symptoms continued to get worse.On his initial physical examination his blood pressure was 168/104, pulse rate 112, temperature 98.6, respiratory rate 29, oxygen saturation 98%, his lungs are clear to auscultation bilaterally, heart S1-S2 present rhythmic, his abdomen was soft.Left foot fourth toe with round ulcerated lesion with erythema and edema. Left foot films with ulcerated lesion along the medial fourth toe with underlying osteomyelitis of the middle and distal phalanges and gangrenous soft tissue infection.  Patient was placed on broad-spectrum antibiotic therapy, intravenous fluids and orthopedic consultation. Patient underwent a transmetatarsal amputation April 21.  During surgery he was noted no flow in the dorsalis pedis at the amputation level. Vascular surgery was consulted, considering likely embolic phenomena. Further work-up with echocardiography and CT angiography has been ordered, with no embolic source.  Patient scheduled for left lower extremity runoff arteriogram on 4/26.    Assessment & Plan:   Principal Problem:   Osteomyelitis of toe of left foot (HCC) Active Problems:   CKD (chronic kidney disease) stage 3, GFR 30-59 ml/min   HTN (hypertension)   DM2 (diabetes mellitus, type 2) (HCC)   Leukocytosis   Sepsis (HCC)   Osteoarthritis of toe joint, left   Gangrene of toe of left foot (HCC)   PVD (peripheral vascular  disease) ([place])    1. Left foot right toe osteomyelitis, with dorsalis pedis artery embolism/ peripheral vascular disease. sp transmetatarsal amputation.Reported not clear margins and need 4 more weeks of antibiotic therapy per Dr. Sharol Given recommendations. wound culture positive for staphylococcus MSSA and enterococcus. Echocardiogram and CT angiography with no frank thrombosis.  Now patient on oral antibiotic therapy with Augmentin with good toleration, continue with local wound care and wound VAC.   Pain control with tramadol, oxycodone and hydromorphone.Plan for arteriogram in am.   2. CKD stage 2 (base cr 1,0 with a calculated GFR 80). Renal function has been stable, will check renal panel post contrast exposure.   3. T2DM Fasting glucose is139,continue glucose cover and monitoring with insulin sliding scale.Tolerating po well.  4. HTN.Continue withmetoprolol for blood pressure control  5. Anxiety. ContinuePO PRNlorazepam, with good toleration.      DVT prophylaxis:Enoxaparin Code Status:full Family Communication:I spoke with patient'sfamilyat the bedside, we talked in detail about patient's condition, plan of care and prognosis and all questions were addressed.  Disposition Plan: Patient is from:Home Anticipated DC EX:[id number] Anticipated DC date:To be determined Anticipated DC barriers:Pending vascular workup with cr angiography and echocardiogram.    Consultants:  Surgery orthopedic  Vascular surgery  Procedures:  Transmetatarsal amputation on the right  Antimicrobials:  Augmentin    Subjective: Patient is feeling well, no nausea or vomiting, wound pain is well controlled.   Objective: Vitals:   01/26/20 0515 01/26/20 1418 01/26/20 2001 01/27/20 0357  BP: (!) 151/71 119/73  132/66 (!) 164/85  Pulse: 89 76 88 85  Resp: 17 18 18 19   Temp: 99.3 F (37.4 C) 98.9 F (37.2 C) 98.3 F (36.8 C) 98.6 F (37 C)  TempSrc:  Oral Oral Oral Oral  SpO2: 93% 95% 93% 96%  Weight:      Height:        Intake/Output Summary (Last 24 hours) at 01/27/2020 1355 Last data filed at 01/27/2020 1100 Gross per 24 hour  Intake 1293.83 ml  Output 2790 ml  Net -1496.17 ml   Filed Weights   01/21/20 1133 01/23/20 1448 01/23/20 1507  Weight: 95.3 kg 95.3 kg 95.3 kg    Examination:   General: Not in pain or dyspnea/ Neurology: Awake and alert, non focal  E ENT: no pallor, no icterus, oral mucosa moist Cardiovascular: No JVD. S1-S2 present, rhythmic, no gallops, rubs, or murmurs. No lower extremity edema. Pulmonary: vesicular breath sounds bilaterally, adequate air movement, no wheezing, rhonchi or rales. Gastrointestinal. Abdomen with, no organomegaly, non tender, no rebound or guarding Skin. No rashes Musculoskeletal: right foot sp metatarsal amputation, will continue with wound vac.      Data Reviewed: I have personally reviewed following labs and imaging studies  CBC: Recent Labs  Lab 01/21/20 1127 01/21/20 1127 01/21/20 1619 01/22/20 0435 01/24/20 0538 01/25/20 0239 01/26/20 0842  WBC 18.8*   < > 16.3* 13.1* 12.6* 12.0* 13.2*  NEUTROABS 16.3*  --   --   --   --   --   --   HGB 15.2   < > 14.5 13.3 12.8* 12.5* 13.1  HCT 46.9   < > 43.3 40.4 38.9* 37.0* 40.4  MCV 92.3   < > 91.9 90.6 90.0 89.8 90.8  PLT 232   < > 206 210 243 245 280   < > = values in this interval not displayed.   Basic Metabolic Panel: Recent Labs  Lab 01/21/20 1127 01/21/20 1127 01/21/20 1619 01/22/20 0435 01/24/20 0538 01/25/20 0239 01/26/20 0842  NA 134*  --   --  135 132* 135 134*  K 4.4  --   --  3.7 4.0 4.0 3.9  CL 98  --   --  102 101 103 102  CO2 24  --   --  22 20* 23 21*  GLUCOSE 190*  --   --  105* 142* 94 139*  BUN 19  --   --  16 13 17 16   CREATININE 1.33*   < >  1.11 1.10 1.13 1.05 1.02  CALCIUM 8.9  --   --  8.3* 8.1* 8.2* 8.5*   < > = values in this interval not displayed.   GFR: Estimated Creatinine Clearance: 88.8 mL/min (by C-G formula based on SCr of 1.02 mg/dL). Liver Function Tests: Recent Labs  Lab 01/21/20 1127  AST 12*  ALT 11  ALKPHOS 63  BILITOT 0.7  PROT 7.9  ALBUMIN 3.7   No results for input(s): LIPASE, AMYLASE in the last 168 hours. No results for input(s): AMMONIA in the last 168 hours. Coagulation Profile: No results for input(s): INR, PROTIME in the last 168 hours. Cardiac Enzymes: No results for input(s): CKTOTAL, CKMB, CKMBINDEX, TROPONINI in the last 168 hours. BNP (last 3 results) No results for input(s): PROBNP in the last 8760 hours. HbA1C: No results for input(s): HGBA1C in the last 72 hours. CBG: Recent Labs  Lab 01/26/20 1203 01/26/20 1648 01/26/20 2003 01/27/20 0747 01/27/20 1226  GLUCAP 156* 132* 160* 118* 153*   Lipid Profile: No results for input(s): CHOL, HDL, LDLCALC, TRIG, CHOLHDL, LDLDIRECT in the last 72 hours. Thyroid Function Tests: No results for input(s): TSH, T4TOTAL, FREET4, T3FREE, THYROIDAB in the last 72 hours.  Anemia Panel: No results for input(s): VITAMINB12, FOLATE, FERRITIN, TIBC, IRON, RETICCTPCT in the last 72 hours.    Radiology Studies: I have reviewed all of the imaging during this hospital visit personally     Scheduled Meds: . amoxicillin-clavulanate  1 tablet Oral Q12H  . docusate sodium  100 mg Oral BID  . insulin aspart  0-15 Units Subcutaneous TID WC  . insulin aspart  0-5 Units Subcutaneous QHS  . metoprolol tartrate  50 mg Oral Daily   Continuous Infusions:   LOS: 6 days        Jayelyn Barno Annett Gula, MD

## 2020-01-28 ENCOUNTER — Encounter (HOSPITAL_COMMUNITY)
Admission: EM | Disposition: A | Discharge status: Home Health | Payer: Self-pay | Source: Home / Self Care | Attending: Internal Medicine

## 2020-01-28 DIAGNOSIS — I96 Gangrene, not elsewhere classified: Secondary | ICD-10-CM

## 2020-01-28 HISTORY — PX: ABDOMINAL AORTOGRAM W/LOWER EXTREMITY: CATH118223

## 2020-01-28 LAB — CBC
HCT: 39.3 % (ref 39.0–52.0)
Hemoglobin: 12.9 g/dL — ABNORMAL LOW (ref 13.0–17.0)
MCH: 29.6 pg (ref 26.0–34.0)
MCHC: 32.8 g/dL (ref 30.0–36.0)
MCV: 90.1 fL (ref 80.0–100.0)
Platelets: 318 10*3/uL (ref 150–400)
RBC: 4.36 MIL/uL (ref 4.22–5.81)
RDW: 13.2 % (ref 11.5–15.5)
WBC: 11.3 10*3/uL — ABNORMAL HIGH (ref 4.0–10.5)
nRBC: 0 % (ref 0.0–0.2)

## 2020-01-28 LAB — BASIC METABOLIC PANEL
Anion gap: 11 (ref 5–15)
BUN: 16 mg/dL (ref 8–23)
CO2: 24 mmol/L (ref 22–32)
Calcium: 8.6 mg/dL — ABNORMAL LOW (ref 8.9–10.3)
Chloride: 101 mmol/L (ref 98–111)
Creatinine, Ser: 0.95 mg/dL (ref 0.61–1.24)
GFR calc Af Amer: >60 mL/min (ref >60)
GFR calc non Af Amer: >60 mL/min (ref >60)
Glucose, Bld: 128 mg/dL — ABNORMAL HIGH (ref 70–99)
Potassium: 3.9 mmol/L (ref 3.5–5.1)
Sodium: 136 mmol/L (ref 135–145)

## 2020-01-28 LAB — AEROBIC/ANAEROBIC CULTURE W GRAM STAIN (SURGICAL/DEEP WOUND): Gram Stain: NONE SEEN

## 2020-01-28 LAB — GLUCOSE, CAPILLARY
Glucose-Capillary: 137 mg/dL — ABNORMAL HIGH (ref 70–99)
Glucose-Capillary: 147 mg/dL — ABNORMAL HIGH (ref 70–99)
Glucose-Capillary: 147 mg/dL — ABNORMAL HIGH (ref 70–99)
Glucose-Capillary: 127 mg/dL — ABNORMAL HIGH (ref 70–99)

## 2020-01-28 SURGERY — ABDOMINAL AORTOGRAM W/LOWER EXTREMITY
Anesthesia: LOCAL | Laterality: Bilateral

## 2020-01-28 MED ORDER — ONDANSETRON HCL 4 MG/2ML IJ SOLN: 4.0000 mg | Freq: Four times a day (QID) | INTRAMUSCULAR | Status: DC | PRN

## 2020-01-28 MED ORDER — SODIUM CHLORIDE 0.9% FLUSH: 3.0000 mL | INTRAVENOUS | Status: DC | PRN

## 2020-01-28 MED ORDER — MIDAZOLAM HCL 2 MG/2ML IJ SOLN
INTRAMUSCULAR | Status: DC | PRN
  Administered 2020-01-28: 1 mg via INTRAVENOUS

## 2020-01-28 MED ORDER — POLYETHYLENE GLYCOL 3350 17 G PO PACK
17.0000 g | PACK | Freq: Two times a day (BID) | ORAL | Status: DC
  Administered 2020-01-28: 17 g via ORAL
  Filled 2020-01-28: qty 1

## 2020-01-28 MED ORDER — SODIUM CHLORIDE 0.9% FLUSH
3.0000 mL | Freq: Two times a day (BID) | INTRAVENOUS | Status: DC
  Administered 2020-01-28 – 2020-01-29 (×2): 3 mL via INTRAVENOUS

## 2020-01-28 MED ORDER — FENTANYL CITRATE (PF) 100 MCG/2ML IJ SOLN
INTRAMUSCULAR | Status: AC
  Filled 2020-01-28: qty 2

## 2020-01-28 MED ORDER — MIDAZOLAM HCL 2 MG/2ML IJ SOLN
INTRAMUSCULAR | Status: AC
  Filled 2020-01-28: qty 2

## 2020-01-28 MED ORDER — HEPARIN (PORCINE) IN NACL 1000-0.9 UT/500ML-% IV SOLN
INTRAVENOUS | Status: DC | PRN
  Administered 2020-01-28 (×2): 500 mL

## 2020-01-28 MED ORDER — SODIUM CHLORIDE 0.9 % IV SOLN: INTRAVENOUS | Status: AC

## 2020-01-28 MED ORDER — ACETAMINOPHEN 325 MG PO TABS: 650.0000 mg | ORAL_TABLET | ORAL | Status: DC | PRN

## 2020-01-28 MED ORDER — FENTANYL CITRATE (PF) 100 MCG/2ML IJ SOLN
INTRAMUSCULAR | Status: DC | PRN
  Administered 2020-01-28: 25 ug via INTRAVENOUS

## 2020-01-28 MED ORDER — LABETALOL HCL 5 MG/ML IV SOLN: 10.0000 mg | INTRAVENOUS | Status: DC | PRN

## 2020-01-28 MED ORDER — LIDOCAINE HCL (PF) 1 % IJ SOLN
INTRAMUSCULAR | Status: AC
  Filled 2020-01-28: qty 30

## 2020-01-28 MED ORDER — SODIUM CHLORIDE 0.9 % IV SOLN: 250.0000 mL | INTRAVENOUS | Status: DC | PRN

## 2020-01-28 MED ORDER — HYDRALAZINE HCL 20 MG/ML IJ SOLN: 5.0000 mg | INTRAMUSCULAR | Status: DC | PRN

## 2020-01-28 MED ORDER — ASPIRIN EC 81 MG PO TBEC
81.0000 mg | DELAYED_RELEASE_TABLET | Freq: Every day | ORAL | Status: DC
  Administered 2020-01-29: 81 mg via ORAL
  Filled 2020-01-28: qty 1

## 2020-01-28 MED ORDER — SODIUM CHLORIDE 0.9 % IV SOLN: INTRAVENOUS | Status: DC

## 2020-01-28 MED ORDER — LIDOCAINE HCL (PF) 1 % IJ SOLN
INTRAMUSCULAR | Status: DC | PRN
  Administered 2020-01-28: 15 mL via INTRADERMAL

## 2020-01-28 MED ORDER — HEPARIN (PORCINE) IN NACL 1000-0.9 UT/500ML-% IV SOLN
INTRAVENOUS | Status: AC
  Filled 2020-01-28: qty 500

## 2020-01-28 SURGICAL SUPPLY — 10 items
CATH OMNI FLUSH 5F 65CM (CATHETERS) ×1 IMPLANT
CLOSURE MYNX CONTROL 5F (Vascular Products) ×1 IMPLANT
KIT MICROPUNCTURE NIT STIFF (SHEATH) ×1 IMPLANT
KIT PV (KITS) ×2 IMPLANT
SHEATH PINNACLE 5F 10CM (SHEATH) ×1 IMPLANT
SHEATH PROBE COVER 6X72 (BAG) ×1 IMPLANT
SYR MEDRAD MARK V 150ML (SYRINGE) ×1 IMPLANT
TRANSDUCER W/STOPCOCK (MISCELLANEOUS) ×2 IMPLANT
TRAY PV CATH (CUSTOM PROCEDURE TRAY) ×2 IMPLANT
WIRE BENTSON .035X145CM (WIRE) ×1 IMPLANT

## 2020-01-28 NOTE — Progress Notes (Addendum)
PROGRESS NOTE    Brent Lam  QAS:[id number] DOB: Apr 26, 1956 DOA: 01/21/2020 PCP: Danella Penton, MD    Brief Narrative:  Patient was admitted to the hospital with the working diagnosis of left footfourthtoe osteomyelitis.  64 year old male presented with left toe infection. Patient does have significant past medical history for type 2 diabetes mellitus and hypertension. Patient reported 4 weeks of left foot great toe edema, erythema and tenderness. Patient was treated as an outpatient with intravenous Rocephin x1 dose, unfortunately symptoms continued to get worse.On his initial physical examination his blood pressure was 168/104, pulse rate 112, temperature 98.6, respiratory rate 29, oxygen saturation 98%, his lungs are clear to auscultation bilaterally, heart S1-S2 present rhythmic, his abdomen was soft.Left foot fourth toe with round ulcerated lesion with erythema and edema. Left foot films with ulcerated lesion along the medial fourth toe with underlying osteomyelitis of the middle and distal phalanges and gangrenous soft tissue infection.  Patient was placed on broad-spectrum antibiotic therapy, intravenous fluids and orthopedic consultation. Patient underwent a transmetatarsal amputation April 21.  During surgery he was noted no flow in the dorsalis pedis at the amputation level. Vascular surgery was consulted, considering likely embolic phenomena. Further work-up with echocardiography and CT angiography has been ordered, with no embolic source.  Patient scheduled for left lower extremity runoff arteriogram on 4/26.    Assessment & Plan:   Principal Problem:   Osteomyelitis of toe of left foot (HCC) Active Problems:   CKD (chronic kidney disease) stage 3, GFR 30-59 ml/min   HTN (hypertension)   DM2 (diabetes mellitus, type 2) (HCC)   Leukocytosis   Sepsis (HCC)   Osteoarthritis of toe joint, left   Gangrene of toe of left foot (HCC)   PVD (peripheral vascular  disease) (HCC)    1. Left foot right toe osteomyelitis, with dorsalis pedis artery embolism/ peripheral vascular disease. sp transmetatarsal amputation.Reported not clear margins and need 4 more weeks of antibiotic therapy per Dr. Lajoyce Corners recommendations. wound culture positive for staphylococcus MSSA and enterococcus. Echocardiogram and CT angiography with no frank thrombosis. Arteriogram with no major stenosis.   Tolerating well antibiotic therapy with  Augmentin, local wound care and wound VAC. Patient will need home health services.   On tramadol, oxycodone and hydromorphone, for pain control.  Continue with aspirin check lipid profile in am.   2. CKD stage 2 (base cr 1,0 with a calculated GFR 80). Stable renal function and electrolytes. Continue with saline infusion, post contrast exposure.   3. T2DM Today his fasting glucose is128. On glucose cover and monitoring with insulin sliding scale.  4. HTN.Blood pressure controlled with metoprolol.  5. Anxiety. On oral PRNlorazepam, with good toleration.    DVT prophylaxis:Enoxaparin Code Status:full Family Communication:I spoke with patient'sfamilyat the bedside, we talked in detail about patient's condition, plan of care and prognosis and all questions were addressed.  Disposition Plan: Patient is from:Home Anticipated DC NL:[id number] Anticipated DC date:To be determined Anticipated DC barriers:Pending vascular workup with cr angiography and echocardiogram.    Consultants:  Surgery orthopedic  Vascular surgery  Procedures:  Transmetatarsal amputation on the right  Antimicrobials:  Augmentin      Subjective: No chest pain or dyspnea, no significant pain at the surgical site, no nausea or vomiting,.   Objective: Vitals:   01/28/20 1120  01/28/20 1135 01/28/20 1150 01/28/20 1220  BP: 125/71 140/72 136/71 135/77  Pulse: 71 74 74 68  Resp: 19 13 16 17   Temp:  TempSrc:      SpO2: 94% 94% 96% 95%  Weight:      Height:        Intake/Output Summary (Last 24 hours) at 01/28/2020 1348 Last data filed at 01/28/2020 0932 Gross per 24 hour  Intake 820 ml  Output 1100 ml  Net -280 ml   Filed Weights   01/21/20 1133 01/23/20 1448 01/23/20 1507  Weight: 95.3 kg 95.3 kg 95.3 kg    Examination:   General: Not in pain or dyspnea, deconditioned  Neurology: Awake and alert, non focal  E ENTno pallor, no icterus, oral mucosa moist Cardiovascular: No JVD. S1-S2 present, rhythmic, no gallops, rubs, or murmurs. No lower extremity edema. Pulmonary: vesicular breath sounds bilaterally, adequate air movement, no wheezing.  Gastrointestinal. Abdomen with no organomegaly, non tender, no rebound or guarding Skin. No rashes Musculoskeletal: right foot transmetatarsal amputation with wound vac in place.     Data Reviewed: I have personally reviewed following labs and imaging studies  CBC: Recent Labs  Lab 01/22/20 0435 01/24/20 0538 01/25/20 0239 01/26/20 0842 01/28/20 0543  WBC 13.1* 12.6* 12.0* 13.2* 11.3*  HGB 13.3 12.8* 12.5* 13.1 12.9*  HCT 40.4 38.9* 37.0* 40.4 39.3  MCV 90.6 90.0 89.8 90.8 90.1  PLT 210 243 245 280 867   Basic Metabolic Panel: Recent Labs  Lab 01/22/20 0435 01/24/20 0538 01/25/20 0239 01/26/20 0842 01/28/20 0543  NA 135 132* 135 134* 136  K 3.7 4.0 4.0 3.9 3.9  CL 102 101 103 102 101  CO2 22 20* 23 21* 24  GLUCOSE 105* 142* 94 139* 128*  BUN 16 13 17 16 16   CREATININE 1.10 1.13 1.05 1.02 0.95  CALCIUM 8.3* 8.1* 8.2* 8.5* 8.6*   GFR: Estimated Creatinine Clearance: 95.3 mL/min (by C-G formula based on SCr of 0.95 mg/dL). Liver Function Tests: No results for input(s): AST, ALT, ALKPHOS, BILITOT, PROT, ALBUMIN in the last 168 hours. No results for input(s): LIPASE, AMYLASE in the last  168 hours. No results for input(s): AMMONIA in the last 168 hours. Coagulation Profile: No results for input(s): INR, PROTIME in the last 168 hours. Cardiac Enzymes: No results for input(s): CKTOTAL, CKMB, CKMBINDEX, TROPONINI in the last 168 hours. BNP (last 3 results) No results for input(s): PROBNP in the last 8760 hours. HbA1C: No results for input(s): HGBA1C in the last 72 hours. CBG: Recent Labs  Lab 01/27/20 0747 01/27/20 1226 01/27/20 1945 01/28/20 0744 01/28/20 1030  GLUCAP 118* 153* 188* 137* 147*   Lipid Profile: No results for input(s): CHOL, HDL, LDLCALC, TRIG, CHOLHDL, LDLDIRECT in the last 72 hours. Thyroid Function Tests: No results for input(s): TSH, T4TOTAL, FREET4, T3FREE, THYROIDAB in the last 72 hours. Anemia Panel: No results for input(s): VITAMINB12, FOLATE, FERRITIN, TIBC, IRON, RETICCTPCT in the last 72 hours.    Radiology Studies: I have reviewed all of the imaging during this hospital visit personally     Scheduled Meds: . amoxicillin-clavulanate  1 tablet Oral Q12H  . [START ON 01/29/2020] aspirin EC  81 mg Oral Daily  . docusate sodium  100 mg Oral BID  . insulin aspart  0-15 Units Subcutaneous TID WC  . insulin aspart  0-5 Units Subcutaneous QHS  . metoprolol tartrate  50 mg Oral Daily  . sodium chloride flush  3 mL Intravenous Q12H   Continuous Infusions: . sodium chloride 100 mL/hr at 01/28/20 0833  . sodium chloride 75 mL/hr at 01/28/20 1035  . sodium chloride  LOS: 7 days        Miaa Latterell Gerome Apley, MD

## 2020-01-28 NOTE — Progress Notes (Signed)
  Progress Note    01/28/2020 9:17 AM 5 Days Post-Op  Subjective: No issues today  Vitals:   01/27/20 1943 01/28/20 0412  BP: (!) 155/77 (!) 147/86  Pulse: 90 84  Resp: 18 18  Temp: (!) 97.4 F (36.3 C) 98 F (36.7 C)  SpO2: 94% 96%    Physical Exam: Awake alert oriented Wound VAC to suction left foot Palpable left anterior tibial pulse  CBC    Component Value Date/Time   WBC 11.3 (H) 01/28/2020 0543   RBC 4.36 01/28/2020 0543   HGB 12.9 (L) 01/28/2020 0543   HCT 39.3 01/28/2020 0543   PLT 318 01/28/2020 0543   MCV 90.1 01/28/2020 0543   MCH 29.6 01/28/2020 0543   MCHC 32.8 01/28/2020 0543   RDW 13.2 01/28/2020 0543   LYMPHSABS 1.0 01/21/2020 1127   MONOABS 1.3 (H) 01/21/2020 1127   EOSABS 0.1 01/21/2020 1127   BASOSABS 0.1 01/21/2020 1127    BMET    Component Value Date/Time   NA 136 01/28/2020 0543   K 3.9 01/28/2020 0543   CL 101 01/28/2020 0543   CO2 24 01/28/2020 0543   GLUCOSE 128 (H) 01/28/2020 0543   BUN 16 01/28/2020 0543   CREATININE 0.95 01/28/2020 0543   CALCIUM 8.6 (L) 01/28/2020 0543   GFRNONAA >60 01/28/2020 0543   GFRAA >60 01/28/2020 0543    INR No results found for: INR   Intake/Output Summary (Last 24 hours) at 01/28/2020 0917 Last data filed at 01/28/2020 0626 Gross per 24 hour  Intake 1180 ml  Output 1700 ml  Net -520 ml     Assessment/plan:  64 y.o. male is status post left transmetatarsal amputation.  Embolic work-up of the chest abdomen pelvis has been negative.  We will perform angiography today to evaluate the left lower extremity for intervention.   Belkys Henault C. Randie Heinz, MD [facility] Office: [phone] Pager: [phone]  01/28/2020 9:17 AM

## 2020-01-28 NOTE — Progress Notes (Signed)
Wound vac left foot intact.

## 2020-01-28 NOTE — Progress Notes (Signed)
MEWS flagged yellow for respiratory rate, but no respiratory rate found in flow sheets. Assessed patient; RR 24, no SOB.

## 2020-01-28 NOTE — Progress Notes (Addendum)
   01/24/20 0210  Assess: MEWS Score  Temp (!) 101.9 F (38.8 C)  BP (!) 162/82  Pulse Rate (!) 107  Resp 18  SpO2 94 %  O2 Device Room Air  Assess: MEWS Score  MEWS Temp 2  MEWS Systolic 0  MEWS Pulse 1  MEWS RR 0  MEWS LOC 0  MEWS Score 3  MEWS Score Color Yellow  Assess: if the MEWS score is Yellow or Red  Were vital signs taken at a resting state? Yes  Focused Assessment Documented focused assessment  Early Detection of Sepsis Score *See Row Information* Low  MEWS guidelines implemented *See Row Information* Yes  Treat  MEWS Interventions Administered prn meds/treatments  Take Vital Signs  Increase Vital Sign Frequency  Yellow: Q 2hr X 2 then Q 4hr X 2, if remains yellow, continue Q 4hrs  Escalate  MEWS: Escalate Yellow: discuss with charge nurse/RN and consider discussing with provider and RRT  Notify: Charge Nurse/RN  Name of Charge Nurse/RN Notified Tara, RN  Date Charge Nurse/RN Notified 01/24/20  Time Charge Nurse/RN Notified 0211  Notify: Provider  Provider Name/Title Marikay Alar  Date Provider Notified 01/23/20  Time Provider Notified 0222  Notification Type Page  Notification Reason Change in status  Response See new orders  Date of Provider Response 01/24/20  Time of Provider Response 0235  Document  Patient Outcome Stabilized after interventions

## 2020-01-28 NOTE — Progress Notes (Signed)
Occupational Therapy Treatment Patient Details Name: Brent Lam MRN: [id number] DOB: 1956/08/20 Today's Date: 01/28/2020    History of present illness Pt is a 64 y.o. male with L 4th toe infection extending into forefoot. 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 Pt still with L foot cellulitis, potential for additional vascular intervention. PMH includes DM2, HTN.   OT comments  Patient agreeable to engage in OT session.  Completing transfers and in room mobility using RW with min guard for safety, noted with poor safety awareness and impulsivity with mobility today.  Discussed high risk of falls with increased pace of mobility and poor awareness of lines (wound vac and IV), patient only voicing "I"m good". Further education provided on fall prevention, DME safety, and recommendations with pt presenting with limited acceptance to education. Patient reporting "I"m not going home for a while".  Continue to recommend [place] services and 24/7 support initially.  Will follow acutely.    Follow Up Recommendations  Home health OT;Supervision/Assistance - 24 hour(initally)    Equipment Recommendations  3 in 1 bedside commode;Tub/shower seat    Recommendations for Other Services      Precautions / Restrictions Precautions Precautions: Fall Precaution Comments: wound vac Restrictions Weight Bearing Restrictions: Yes LLE Weight Bearing: Non weight bearing       Mobility Bed Mobility               General bed mobility comments: OOB upon entry  Transfers Overall transfer level: Needs assistance Equipment used: Rolling walker (2 wheeled) Transfers: Sit to/from Stand Sit to Stand: Min guard         General transfer comment: min guard for safety/balance, cueing for hand placement as patient initally attempts to pull up on RW with BUES     Balance Overall balance assessment: Needs assistance Sitting-balance support: No upper extremity supported;Feet supported Sitting balance-Leahy  Scale: Good     Standing balance support: Bilateral upper extremity supported;During functional activity Standing balance-Leahy Scale: Poor Standing balance comment: relaint on UB support                           ADL either performed or assessed with clinical judgement   ADL Overall ADL's : Needs assistance/impaired                       Lower Body Dressing Details (indicate cue type and reason): discussed techniques with LB dressing with wound vac and safety, pt verbalizes understanding; relaint on BUE support in standing  Toilet Transfer: Min guard;Ambulation;RW Toilet Transfer Details (indicate cue type and reason): simulated to/from chair; cueing for hand placement and safety throughout          Functional mobility during ADLs: Min guard;Rolling walker;Cueing for safety General ADL Comments: pt impulsively moving about room with no awareness to wound vac cord or IV pole; therapist asking patient to slow down and patient demonstrating ability to complete full turn without awareness to lines. Pt reporting "I've been up since 530" and "I'm good".       Vision       Perception     Praxis      Cognition Arousal/Alertness: Awake/alert Behavior During Therapy: Flat affect;Impulsive Overall Cognitive Status: Impaired/Different from baseline Area of Impairment: Safety/judgement;Problem solving                         Safety/Judgement: Decreased awareness of safety   Problem  Solving: Requires verbal cues General Comments: patient with limited awareness of safety, impulsive with mobility and limited engagement with therapist during session         Exercises     Shoulder Instructions       General Comments Educated on safety, fall prevention and DME recommendations, pt reports "I"m not going anytime soon" and "i'll figure it out"; encouarged L LE elevation while seated    Pertinent Vitals/ Pain       Pain Assessment: Faces Faces Pain  Scale: Hurts a little bit Pain Location: L foot Pain Descriptors / Indicators: Aching Pain Intervention(s): Monitored during session;Repositioned  Home Living                                          Prior Functioning/Environment              Frequency  Min 2X/week        Progress Toward Goals  OT Goals(current goals can now be found in the care plan section)  Progress towards OT goals: Progressing toward goals  Acute Rehab OT Goals Patient Stated Goal: Return home OT Goal Formulation: With patient  Plan Discharge plan remains appropriate;Frequency remains appropriate    Co-evaluation                 AM-PAC OT "6 Clicks" Daily Activity     Outcome Measure   Help from another person eating meals?: A Little Help from another person taking care of personal grooming?: A Little Help from another person toileting, which includes using toliet, bedpan, or urinal?: A Lot Help from another person bathing (including washing, rinsing, drying)?: A Little Help from another person to put on and taking off regular upper body clothing?: A Little Help from another person to put on and taking off regular lower body clothing?: A Lot 6 Click Score: 16    End of Session Equipment Utilized During Treatment: Rolling walker  OT Visit Diagnosis: Unsteadiness on feet (R26.81);Other abnormalities of gait and mobility (R26.89);Pain Pain - Right/Left: Left Pain - part of body: Ankle and joints of foot   Activity Tolerance Other (comment)(treatment limited by patient participation )   Patient Left in chair;with call bell/phone within reach(RN aware of pt position )   Nurse Communication Mobility status;Precautions        Time: 5170-0174 OT Time Calculation (min): 9 min  Charges: OT General Charges $OT Visit: 1 Visit OT Treatments $Self Care/Home Management : 8-22 mins  Barry Brunner, OT Acute Rehabilitation Services Pager [phone] Office  [phone]    Chancy Milroy 01/28/2020, 8:49 AM

## 2020-01-28 NOTE — Progress Notes (Addendum)
Paged on-call [facility] to inquire if telemetry monitoring needs to be continued after pt transfer to 6N from 6E. Awaiting response. Currently no monitors on this unit. 01/28/2020 @ 2255 Manson Allan, RN Return call from Dr. Cliffton Asters with orders to discontinue telemetry monitoring. 01/28/2020 @2307  , RN

## 2020-01-28 NOTE — Progress Notes (Signed)
Pt arrived to unit as transfer from 6East at 2140. Per report the patient was not happy about being transferred and someone from administration will visit with him in the morning. Upon arrival to unit pt has been A&Ox4, pleasant and denied any needs. He  complained of pain and was medicated as ordered. He has informed his family of his transfer and has been oriented to room and unit protocols. Callbell in reach, encouraged to call for any needs or concerns. Pt verbalized understanding. No distress noted or voiced. 01/28/2020 @ 2145 Manson Allan, RN

## 2020-01-28 NOTE — Plan of Care (Signed)
  Problem: Education: Goal: Knowledge of General Education information will improve Description Including pain rating scale, medication(s)/side effects and non-pharmacologic comfort measures Outcome: Progressing   

## 2020-01-28 NOTE — Op Note (Signed)
    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 Randie Heinz, MD Procedure Performed: 1.  Ultrasound-guided cannulation right common femoral artery 2.  Aortogram 3.  Selection of left common femoral artery and left lower extremity angiogram 4.  Right lower extremity angiography 5.  Moderate sedation with fentanyl and Versed for 35 minutes 6.  Minx device closure of right common femoral artery  Indications: 64 year old male has undergone left transmetatarsal amputation that demonstrated minimal bleeding.  He does have a palpable anterior tibial artery pulse.  He has undergone embolic work-up with a CT scan of his chest abdomen pelvis now is indicated for lower extremity angiography.  Findings: There was a posterior plaque in the right common femoral artery by ultrasound.  There were no flow-limiting stenoses in the aorta or iliac systems.  Left lower extremity does not appear to have any flow-limiting stenosis or areas concerning for embolism.  Posterior tibial artery is occluded after the takeoff reconstitutes at the ankle.  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  He does have runoff dominant via the anterior tibial artery has a peroneal artery to the ankle on the left.  On the right side he has very similar disease.  He has less than 20% stenosis of the popliteal artery above the knee.  Anterior tibial and peroneal arteries runoff is dominant.  Posterior tibial artery again occludes after the takeoff reconstitutes at the ankle and may be secondary to embolic disease.   Procedure:  The patient was identified in the holding area and taken to room 8.  The patient was then placed supine on the table and prepped and draped in the usual sterile fashion.  A time out was called.  Ultrasound was used to evaluate the  right common femoral artery.  There was a posterior appearing plaque but this was otherwise patent without any flow-limiting stenosis.  Was cannulated with direct ultrasound visualization a micropuncture needle followed the wire sheath.  Images saved the permanent record.  Bentson wire was placed followed by 5 [place] sheath.  Omni catheter was placed the level of L1 aortogram was performed.  We crossed the bifurcation with Bentson wire straight catheter perform left lower extremity angiography from the common femoral artery down.  We then removed the catheter over a wire perform right lower extremity angiography through the sheath.  A minx device was deployed.  He tolerated seizure without immediate complication.  Contrast: 90 cc     Amairany Schumpert C. Randie Heinz, MD [facility] Office: [phone] Pager: [phone]

## 2020-01-28 NOTE — Progress Notes (Signed)
Patient ID: Brent Lam, male   DOB: [birth year], 64 y.o.   MRN: [id number] Comfortable in his room. Discussed overall work-up with the patient and his wife present. Negative embolic source work-up with negative echocardiogram and CT of chest and abdomen and pelvis.  Aortogram with runoff today showed no source for embolus on the left leg.  Has completely normal anterior tibial runoff to the level of his ankle.  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  No further work-up indicated.  Would place him on daily aspirin.  Will not follow actively.  Please call if we can assist

## 2020-01-28 NOTE — Progress Notes (Signed)
PT Cancellation Note  Patient Details Name: Brent Lam MRN: [id number] DOB: [birth year]   Cancelled Treatment:    Reason Eval/Treat Not Completed: Patient at procedure or test/unavailable Pt off floor for procedure. Will follow up.   [name] 01/28/2020, 10:23 AM [place], PT, DPT Acute Rehabilitation Services Pager [phone] Office [phone]

## 2020-01-29 LAB — BASIC METABOLIC PANEL
Anion gap: 12 (ref 5–15)
BUN: 20 mg/dL (ref 8–23)
CO2: 23 mmol/L (ref 22–32)
Calcium: 8.7 mg/dL — ABNORMAL LOW (ref 8.9–10.3)
Chloride: 102 mmol/L (ref 98–111)
Creatinine, Ser: 1.05 mg/dL (ref 0.61–1.24)
GFR calc Af Amer: >60 mL/min (ref >60)
GFR calc non Af Amer: >60 mL/min (ref >60)
Glucose, Bld: 107 mg/dL — ABNORMAL HIGH (ref 70–99)
Potassium: 4.3 mmol/L (ref 3.5–5.1)
Sodium: 137 mmol/L (ref 135–145)

## 2020-01-29 LAB — LIPID PANEL
Cholesterol: 126 mg/dL (ref 0–200)
HDL: 14 mg/dL — ABNORMAL LOW (ref >40)
LDL Cholesterol: 83 mg/dL (ref 0–99)
Total CHOL/HDL Ratio: 9 RATIO
Triglycerides: 146 mg/dL (ref <150)
VLDL: 29 mg/dL (ref 0–40)

## 2020-01-29 LAB — CBC
HCT: 39.9 % (ref 39.0–52.0)
Hemoglobin: 13.1 g/dL (ref 13.0–17.0)
MCH: 29.6 pg (ref 26.0–34.0)
MCHC: 32.8 g/dL (ref 30.0–36.0)
MCV: 90.1 fL (ref 80.0–100.0)
Platelets: 355 10*3/uL (ref 150–400)
RBC: 4.43 MIL/uL (ref 4.22–5.81)
RDW: 13.3 % (ref 11.5–15.5)
WBC: 11.9 10*3/uL — ABNORMAL HIGH (ref 4.0–10.5)
nRBC: 0 % (ref 0.0–0.2)

## 2020-01-29 LAB — GLUCOSE, CAPILLARY
Glucose-Capillary: 135 mg/dL — ABNORMAL HIGH (ref 70–99)
Glucose-Capillary: 163 mg/dL — ABNORMAL HIGH (ref 70–99)

## 2020-01-29 MED ORDER — AMOXICILLIN-POT CLAVULANATE 875-125 MG PO TABS: 1.0000 | ORAL_TABLET | Freq: Two times a day (BID) | ORAL | 0 refills | Status: AC

## 2020-01-29 MED ORDER — ASPIRIN 81 MG PO TBEC: 81.0000 mg | DELAYED_RELEASE_TABLET | Freq: Every day | ORAL | 0 refills | Status: AC

## 2020-01-29 MED ORDER — TRAMADOL HCL 50 MG PO TABS: 50.0000 mg | ORAL_TABLET | Freq: Three times a day (TID) | ORAL | 0 refills | Status: DC | PRN

## 2020-01-29 NOTE — Progress Notes (Signed)
Patient ID: Brent Lam, male   DOB: 19-Aug-1956, 64 y.o.   MRN: [id number] Patient denies any pain in his foot this morning.  He states he occasionally has a little bit of pain over the little toe.  There is minimal drainage in the wound VAC canister.  Discussed with patient there are no further vascular studies or interventions available.  Discussed that he has single-vessel runoff to both lower extremities to the ankle with occlusion of the artery on the left between the ankle and the amputation site.  Discussed that he has about a 50% chance of healing the incision.  Anticipate discharge on Augmentin.  Discharged with the portable Praveena wound VAC pump.  I will follow-up in the office in 1 week.

## 2020-01-29 NOTE — TOC Progression Note (Addendum)
Transition of Care [facility] - Progression Note    Patient Details  Name: Brent Lam MRN: [id number] Date of Birth: 03/16/1956  Transition of Care [facility]) CM/SW Contact  Nadene Rubins Adria Devon, RN Phone Number: 01/29/2020, 11:02 AM  Clinical Narrative:     See prior note.   Received report from Vickki Muff NCM [facility] unable to accept patient , not in network with insurance. [facility] unable to accept patient.   Received a call from Lupita Leash with [facility] she is unable to accept referral.  Spoke to patient at bedside, he called [name] . Both aware. Next two choices are [facility] and [facility], if they are unable to accept, they have no preference , however they do not want [facility].  [facility] checking to see if they can do PT , they do not have OT.  [place] with [facility] unable to accept , patient's insurance is out of network.   Elnita Maxwell with [facility] unable to accept out of network with insurance.     Ordered walker, wheel chair and 3 in1 to come to room, patient and [name] aware    Santina Evans with [facility] can accept referral for HHPT. Clinicals faxed to [place] at [phone]   Expected Discharge Plan: Home w Home Health Services Barriers to Discharge: Continued Medical Work up  Expected Discharge Plan and Services Expected Discharge Plan: Home w Home Health Services   Discharge Planning Services: CM Consult Post Acute Care Choice: Home Health, Durable Medical Equipment Living arrangements for the past 2 months: Single Family Home Expected Discharge Date: 01/29/20               DME Arranged: 3-N-1, Community education officer wheelchair with seat cushion, Walker rolling         HH Arranged: PT           Social Determinants of Health (SDOH) Interventions    Readmission Risk Interventions No flowsheet data found.

## 2020-01-29 NOTE — Discharge Summary (Signed)
Physician Discharge Summary  Brent Lam ZOX:[id number] DOB: 11-28-1955 DOA: 01/21/2020  PCP: Danella Penton, MD  Admit date: 01/21/2020 Discharge date: 01/29/2020  Admitted From: Home  Disposition:  Home   Recommendations for Outpatient Follow-up and new medication changes:  1. Follow up with Dr. Hyacinth Meeker in 7 days.  2. Continue wound vac at home and follow up with Dr Lajoyce Corners in 7 days. 3. Continue antibiotic therapy with oral Augmentin   Home Health:  Yes  Equipment/Devices: home wound vac, rolling walker, wheelchair.    Discharge Condition: stable  CODE STATUS: full  Diet recommendation: heart healthy and diabetic prudent.   Brief/Interim Summary: Patient was admitted to the hospital with the working diagnosis of left footfourthtoe osteomyelitis.  64 year old male presented with left 4th toe infection. Patient does have significant past medical history for type 2 diabetes mellitus and hypertension. Patient reported 4 weeks of left foot 4th toe edema, erythema and tenderness. Patient was treated as an outpatient with intravenous Rocephin x1 dose, unfortunately symptoms continued to get worse.On his initial physical examination his blood pressure was 168/104, pulse rate 112, temperature 98.6, respiratory rate 29, oxygen saturation 98%, his lungs were clear to auscultation bilaterally, heart S1-S2 present rhythmic, his abdomen was soft.Left foot fourth toe with round ulcerated lesion with erythema and edema. Left foot films with ulcerated lesion along the medial fourth toe with underlying osteomyelitis of the middle and distal phalanges and gangrenous soft tissue infection.  Patient was placed on broad-spectrum antibiotic therapy, intravenous fluids and orthopedic consultation. Patient underwent a transmetatarsal amputation April 21.  During surgery he was noted no flow in the dorsalis pedis at the amputation level. Vascular surgery was consulted, considering likely embolic  phenomena. Further work-up with echocardiography and CT angiography was performed with no embolic source.  Further work up with left lower extremity runoff arteriogram with no source of for embolus.   1.  Left foot fourth toe osteomyelitis, with dorsalis pedis artery embolism, peripheral vascular disease, status post transmetatarsal amputation.  Patient was admitted to the telemetry ward, he received broad-spectrum IV antibiotic therapy, IV analgesics and DVT prophylaxis.  Patient underwent transmetatarsal amputation with wound VAC placement.  Surgical margins were not clear and patient was continued on antibiotic therapy.  His cultures were positive for Staphylococcus aureus MSSA and Enterococcus.  Patient has been transitioned to oral antibiotic with Augmentin with good toleration, he will continue for 3 more weeks post discharge to complete 4 weeks of therapy.  Continue wound VAC at home and follow-up with Dr. Lajoyce Corners within 7 days.  Continue pain control with tramadol and acetaminophen.  2.  Chronic kidney disease stage II, based creatinine 1.0, calculated GFR 80.  Patient received intravenous fluids, his kidney function remained stable, with a discharge creatinine of 1.0, BUN 20, sodium 137, potassium 4.3, chloride 102 bicarb 23.  3.  Uncontrolled type 2 diabetes mellitus/hemoglobin A1c 7.1.  Patient was placed on insulin sliding scale during his hospitalization, his capillary glucose remained well controlled, at discharge patient will continue Metformin.  4.  Peripheral vascular disease.  Patient has been placed on aspirin 81 mg daily, will follow her cholesterol 126, HDL 14, LDL 83, triglycerides 146. Follow as outpatient.   Discharge Diagnoses:  Principal Problem:   Osteomyelitis of toe of left foot (HCC) Active Problems:   CKD (chronic kidney disease) stage 3, GFR 30-59 ml/min   HTN (hypertension)   DM2 (diabetes mellitus, type 2) (HCC)   Leukocytosis   Sepsis (HCC)  Osteoarthritis  of toe joint, left   Gangrene of toe of left foot (HCC)   PVD (peripheral vascular disease) [facility])    Discharge Instructions  Discharge Instructions    Negative Pressure Wound Therapy - Incisional   Complete by: As directed    Please show patient how to attach prevena vac     Allergies as of 01/29/2020   No Known Allergies     Medication List    STOP taking these medications   doxycycline 100 MG capsule Commonly known as: VIBRAMYCIN     TAKE these medications   acetaminophen 500 MG tablet Commonly known as: TYLENOL Take 1,000 mg by mouth every 6 (six) hours as needed for moderate pain.   amoxicillin-clavulanate 875-125 MG tablet Commonly known as: AUGMENTIN Take 1 tablet by mouth every 12 (twelve) hours for 21 days.   aspirin 81 MG EC tablet Take 1 tablet (81 mg total) by mouth daily. Start taking on: January 30, 2020   metFORMIN 500 MG tablet Commonly known as: GLUCOPHAGE Take 500 mg by mouth 2 (two) times daily with a meal.   metoprolol tartrate 50 MG tablet Commonly known as: LOPRESSOR Take 50 mg by mouth daily. am   traMADol 50 MG tablet Commonly known as: ULTRAM Take 1 tablet (50 mg total) by mouth every 8 (eight) hours as needed for severe pain.   VITAMIN B-12 SL Place under the tongue once a week.            Durable Medical Equipment  (From admission, onward)         Start     Ordered   01/24/20 1540  For home use only DME Walker rolling  Once    Question Answer Comment  Walker: With 5 Inch Wheels   Patient needs a walker to treat with the following condition Weakness      01/24/20 1539   01/24/20 1538  For home use only DME 3 n 1  Once     01/24/20 1539   01/24/20 1538  For home use only DME lightweight manual wheelchair with seat cushion  Once    Comments: Patient suffers from plan for diagnostic arteriogram and possible therapeutic intervention tomorrow which impairs their ability to perform daily activities like ambulating  in the home.   A cane  will not resolve  issue with performing activities of daily living. A wheelchair will allow patient to safely perform daily activities. Patient is not able to propel themselves in the home using a standard weight wheelchair due to plan for diagnostic arteriogram and possible therapeutic intervention tomorrow. Patient can self propel in the lightweight wheelchair. Length of need lifetime  Accessories: elevating leg rests (ELRs), wheel locks, extensions and anti-tippers.  Seat and back cushions   01/24/20 1539         Follow-up Information    Nadara Mustard, MD Follow up.   Specialty: Orthopedic Surgery Why: follow up in office on friday Contact information: [address] [place] [address] [phone]          No Known Allergies  Consultations:  Orthopedic surgery   Vascular surgery    Procedures/Studies: DG Chest 2 View  Result Date: 01/21/2020 CLINICAL DATA:  Preoperative study. EXAM: CHEST - 2 VIEW COMPARISON:  None. FINDINGS: The heart size and mediastinal contours are within normal limits. Both lungs are clear. The visualized skeletal structures are unremarkable. Unchanged small radiopaque density in the right anterolateral lower chest wall overlying the costophrenic angle. IMPRESSION:  No active cardiopulmonary disease. Electronically Signed   By: Obie Dredge M.D.   On: 01/21/2020 13:24   PERIPHERAL VASCULAR CATHETERIZATION  Result Date: 01/28/2020 Patient name: Brent Lam MRN: [id number] DOB: [birth year] Sex: male 01/28/2020 Pre-operative Diagnosis: Left lower extremity transmetatarsal amputation with likely embolic disease Post-operative diagnosis:  Same Surgeon:  Luanna Salk. Randie Heinz, MD Procedure Performed: 1.  Ultrasound-guided cannulation right common femoral artery 2.  Aortogram 3.  Selection of left common femoral artery and left lower extremity angiogram 4.  Right lower extremity angiography 5.  Moderate sedation with fentanyl and Versed for 35 minutes 6.   Minx device closure of right common femoral artery Indications: 64 year old male has undergone left transmetatarsal amputation that demonstrated minimal bleeding.  He does have a palpable anterior tibial artery pulse.  He has undergone embolic work-up with a CT scan of his chest abdomen pelvis now is indicated for lower extremity angiography. Findings: There was a posterior plaque in the right common femoral artery by ultrasound.  There were no flow-limiting stenoses in the aorta or iliac systems.  Left lower extremity does not appear to have any flow-limiting stenosis or areas concerning for embolism.  Posterior tibial artery is occluded after the takeoff reconstitutes at the ankle.  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  He does have runoff dominant via the anterior tibial artery has a peroneal artery to the ankle on the left.  On the right side he has very similar disease.  He has less than 20% stenosis of the popliteal artery above the knee.  Anterior tibial and peroneal arteries runoff is dominant.  Posterior tibial artery again occludes after the takeoff reconstitutes at the ankle and may be secondary to embolic disease.  Procedure:  The patient was identified in the holding area and taken to room 8.  The patient was then placed supine on the table and prepped and draped in the usual sterile fashion.  A time out was called.  Ultrasound was used to evaluate the right common femoral artery.  There was a posterior appearing plaque but this was otherwise patent without any flow-limiting stenosis.  Was cannulated with direct ultrasound visualization a micropuncture needle followed the wire sheath.  Images saved the permanent record.  Bentson wire was placed followed by 5 [place] sheath.  Omni catheter was placed the level of L1 aortogram was performed.  We crossed the bifurcation with Bentson wire straight catheter perform left lower extremity angiography from the common femoral artery down.  We then  removed the catheter over a wire perform right lower extremity angiography through the sheath.  A minx device was deployed.  He tolerated seizure without immediate complication. Contrast: 90 cc Brandon C. Randie Heinz, MD [facility] Office: [phone] Pager: [phone]  DG Foot Complete Left  Result Date: 01/21/2020 CLINICAL DATA:  Fourth toe infection. EXAM: LEFT FOOT - COMPLETE 3+ VIEW COMPARISON:  None. FINDINGS: Ulceration along the medial fourth toe at the DIP joint with underlying erosion of the medial fourth middle and distal phalanges. Prominent soft tissue gas in the fourth toe with fourth toe and distal forefoot soft tissue swelling. No acute fracture or dislocation. Mild osteoarthritis of the first MTP joint. Bone mineralization is normal. Large plantar and Achilles enthesophytes. Vascular calcifications. IMPRESSION: 1. Ulceration along the medial fourth toe with underlying osteomyelitis of the fourth middle and distal phalanges and gangrenous soft tissue infection of the fourth toe. Electronically Signed   By: Vickki Hearing.D.  On: 01/21/2020 13:29   VAS [place] ABI WITH/WO TBI  Result Date: 01/23/2020 LOWER EXTREMITY DOPPLER STUDY Indications: Ulceration, gangrene, and peripheral artery disease. High Risk Factors: Hypertension.  Comparison Study: No prior studies. Performing Technologist: Olen Cordial Rvt  Examination Guidelines: A complete evaluation includes at minimum, Doppler waveform signals and systolic blood pressure reading at the level of bilateral brachial, anterior tibial, and posterior tibial arteries, when vessel segments are accessible. Bilateral testing is considered an integral part of a complete examination. Photoelectric Plethysmograph (PPG) waveforms and toe systolic pressure readings are included as required and additional duplex testing as needed. Limited examinations for reoccurring indications may be performed as noted.  ABI Findings:  +---------+------------------+-----+---------+--------+ Right    Rt Pressure (mmHg)IndexWaveform Comment  +---------+------------------+-----+---------+--------+ Brachial 150                    triphasic         +---------+------------------+-----+---------+--------+ PTA      213               1.37 biphasic          +---------+------------------+-----+---------+--------+ DP       195               1.25 biphasic          +---------+------------------+-----+---------+--------+ Great Toe119               0.76                   +---------+------------------+-----+---------+--------+ +---------+------------------+-----+---------+-------+ Left     Lt Pressure (mmHg)IndexWaveform Comment +---------+------------------+-----+---------+-------+ Brachial 156                    triphasic        +---------+------------------+-----+---------+-------+ PTA      187               1.20 biphasic         +---------+------------------+-----+---------+-------+ DP       185               1.19 biphasic         +---------+------------------+-----+---------+-------+ Great Toe54                0.35                  +---------+------------------+-----+---------+-------+ +-------+-----------+-----------+------------+------------+ ABI/TBIToday's ABIToday's TBIPrevious ABIPrevious TBI +-------+-----------+-----------+------------+------------+ Right  1.37       0.76                                +-------+-----------+-----------+------------+------------+ Left   1.2        0.35                                +-------+-----------+-----------+------------+------------+  Summary: Right: Resting right ankle-brachial index indicates noncompressible right lower extremity arteries. The right toe-brachial index is normal. Left: Resting left ankle-brachial index is within normal range. No evidence of significant left lower extremity arterial disease. The left toe-brachial index  is abnormal.  *See table(s) above for measurements and observations.  Electronically signed by Gretta Began MD on 01/23/2020 at 3:36:05 PM.    Final    ECHOCARDIOGRAM COMPLETE  Result Date: 01/25/2020    ECHOCARDIOGRAM REPORT   Patient Name:   Brent Lam Date of Exam: 01/25/2020 Medical Rec #:  [id number]  Height:  72.0 in Accession #:    [id number] Weight:       210.0 lb Date of Birth:  August 19, 1956  BSA:          2.175 m Patient Age:    63 years   BP:           147/79 mmHg Patient Gender: M          HR:           88 bpm. Exam Location:  Inpatient Procedure: 2D Echo, Cardiac Doppler and Color Doppler Indications:    Embolism and thrombosis of artery of lower extremity  History:        Patient has no prior history of Echocardiogram examinations.                 Risk Factors:Hypertension and Diabetes. CKD.  Sonographer:    Ross Ludwig RDCS (AE) Referring Phys: [id number] Graceann Congress IMPRESSIONS  1. Abnormal septal motion . Left ventricular ejection fraction, by estimation, is 55 to 60%. The left ventricle has normal function. The left ventricle has no regional wall motion abnormalities. There is moderate left ventricular hypertrophy. Left ventricular diastolic parameters were normal.  2. Right ventricular systolic function is normal. The right ventricular size is normal.  3. Left atrial size was mildly dilated.  4. Septum appears mobile in apical views Unable to visualize septum in subcostal views and no bubble study done.  5. The mitral valve is normal in structure. Trivial mitral valve regurgitation. No evidence of mitral stenosis.  6. The aortic valve is tricuspid. Aortic valve regurgitation is not visualized. Mild aortic valve sclerosis is present, with no evidence of aortic valve stenosis.  7. The inferior vena cava is normal in size with greater than 50% respiratory variability, suggesting right atrial pressure of 3 mmHg. FINDINGS  Left Ventricle: Abnormal septal motion. Left ventricular ejection fraction, by  estimation, is 55 to 60%. The left ventricle has normal function. The left ventricle has no regional wall motion abnormalities. The left ventricular internal cavity size was normal in size. There is moderate left ventricular hypertrophy. Left ventricular diastolic parameters were normal. Right Ventricle: The right ventricular size is normal. No increase in right ventricular wall thickness. Right ventricular systolic function is normal. Left Atrium: Left atrial size was mildly dilated. Right Atrium: Right atrial size was normal in size. Pericardium: There is no evidence of pericardial effusion. Mitral Valve: The mitral valve is normal in structure. Normal mobility of the mitral valve leaflets. Trivial mitral valve regurgitation. No evidence of mitral valve stenosis. MV peak gradient, 3.8 mmHg. The mean mitral valve gradient is 1.0 mmHg. Tricuspid Valve: The tricuspid valve is normal in structure. Tricuspid valve regurgitation is not demonstrated. No evidence of tricuspid stenosis. Aortic Valve: The aortic valve is tricuspid. Aortic valve regurgitation is not visualized. Mild aortic valve sclerosis is present, with no evidence of aortic valve stenosis. Aortic valve mean gradient measures 5.0 mmHg. Aortic valve peak gradient measures 8.6 mmHg. Aortic valve area, by VTI measures 2.59 cm. Pulmonic Valve: The pulmonic valve was normal in structure. Pulmonic valve regurgitation is trivial. No evidence of pulmonic stenosis. Aorta: The aortic root is normal in size and structure. Venous: The inferior vena cava is normal in size with greater than 50% respiratory variability, suggesting right atrial pressure of 3 mmHg. IAS/Shunts: The interatrial septum was not well visualized.  LEFT VENTRICLE PLAX 2D LVIDd:         4.12 cm  Diastology LVIDs:  2.92 cm  LV e' lateral:   11.00 cm/s LV PW:         1.07 cm  LV E/e' lateral: 6.7 LV IVS:        1.62 cm  LV e' medial:    8.16 cm/s LVOT diam:     2.10 cm  LV E/e' medial:  9.0  LV SV:         72 LV SV Index:   33 LVOT Area:     3.46 cm  RIGHT VENTRICLE RV Basal diam:  2.74 cm RV S prime:     14.10 cm/s TAPSE (M-mode): 2.8 cm LEFT ATRIUM             Index       RIGHT ATRIUM           Index LA diam:        4.10 cm 1.88 cm/m  RA Area:     15.20 cm LA Vol (A2C):   62.4 ml 28.69 ml/m RA Volume:   36.20 ml  16.64 ml/m LA Vol (A4C):   53.0 ml 24.37 ml/m LA Biplane Vol: 60.3 ml 27.72 ml/m  AORTIC VALVE AV Area (Vmax):    2.43 cm AV Area (Vmean):   2.60 cm AV Area (VTI):     2.59 cm AV Vmax:           147.00 cm/s AV Vmean:          103.000 cm/s AV VTI:            0.280 m AV Peak Grad:      8.6 mmHg AV Mean Grad:      5.0 mmHg LVOT Vmax:         103.00 cm/s LVOT Vmean:        77.300 cm/s LVOT VTI:          0.209 m LVOT/AV VTI ratio: 0.75  AORTA Ao Root diam: 3.50 cm Ao Asc diam:  3.70 cm MITRAL VALVE MV Area (PHT): 5.75 cm    SHUNTS MV Peak grad:  3.8 mmHg    Systemic VTI:  0.21 m MV Mean grad:  1.0 mmHg    Systemic Diam: 2.10 cm MV Vmax:       0.98 m/s MV Vmean:      54.2 cm/s MV Decel Time: 132 msec MV E velocity: 73.30 cm/s MV A velocity: 56.00 cm/s MV E/A ratio:  1.31 Charlton Haws MD Electronically signed by Charlton Haws MD Signature Date/Time: 01/25/2020/2:45:06 PM    Final    CT Angio Chest/Abd/Pel for Dissection W and/or W/WO  Result Date: 01/24/2020 CLINICAL DATA:  Lower extremity arterial embolism. EXAM: CT ANGIOGRAPHY CHEST, ABDOMEN AND PELVIS TECHNIQUE: Non-contrast CT of the chest was initially obtained. Multidetector CT imaging through the chest, abdomen and pelvis was performed using the standard protocol during bolus administration of intravenous contrast. Multiplanar reconstructed images and MIPs were obtained and reviewed to evaluate the vascular anatomy. CONTRAST:  OMNIPAQUE IOHEXOL 350 MG/ML SOLN COMPARISON:  CT, abdomen and pelvis 12/28/2016 FINDINGS: CTA CHEST FINDINGS Cardiovascular: Heart is normal in size and configuration. No evidence of cardiac chamber  thrombosis. Three-vessel coronary artery calcifications. No pericardial effusion. Thoracic aorta is normal in caliber. No dissection. Minimal atherosclerotic calcification along the inferior aspect of the distal arch. No other atherosclerotic disease. Arch great vessels are widely patent. Pulmonary arteries are normal in caliber. Mediastinum/Nodes: No enlarged mediastinal, hilar, or axillary lymph nodes. Thyroid gland, trachea, and esophagus demonstrate no  significant findings. Lungs/Pleura: 3 mm perifissural nodule, inferior right upper lobe, image 31, series 6. No other nodules. Minor atelectasis in the left lower lobe and superior segment of the right lower lobe. Remainder of the lungs is clear. No pleural effusion or pneumothorax. Musculoskeletal: No fracture or acute finding. No osteoblastic or osteolytic lesions. Small metal pellet lies within the soft tissues adjacent to the anterolateral right eighth rib, unchanged from the prior CT. No chest wall masses. Review of the MIP images confirms the above findings. CTA ABDOMEN AND PELVIS FINDINGS VASCULAR Aorta: Mild atherosclerotic disease. No aneurysm or dissection. No significant stenosis. Celiac: Patent without evidence of aneurysm, dissection, vasculitis or significant stenosis. SMA: Patent without evidence of aneurysm, dissection, vasculitis or significant stenosis. Renals: Dual right and 3 left renal arteries, all widely patent. IMA: Mild plaque at the origin.  No significant stenosis. Inflow: Atherosclerosis of the common iliac arteries, right greater than left, with no significant stenosis. External iliac arteries are widely patent with no atherosclerotic disease. Atherosclerotic plaque noted along the common femoral arteries without significant stenosis. Visualized femoral arteries are widely patent. Veins: No obvious venous abnormality within the limitations of this arterial phase study. Review of the MIP images confirms the above findings. NON-VASCULAR  Hepatobiliary: No focal liver abnormality is seen. No gallstones, gallbladder wall thickening, or biliary dilatation. Pancreas: Unremarkable. No pancreatic ductal dilatation or surrounding inflammatory changes. Spleen: Normal in size without focal abnormality. Adrenals/Urinary Tract: No adrenal masses. Mild bilateral renal cortical thinning. There is bilateral renal perinephric stranding. Symmetric enhancement of the kidneys. Two adjacent nonobstructing stones in the lower pole of the left kidney. Forty we find 9 mm low-attenuation mass in the anterior midpole of the left kidney likely a cyst but not fully characterized. No other renal masses. No hydronephrosis. Normal ureters. Normal bladder. Stomach/Bowel: Normal stomach. Prominent diverticulum from the second portion of the duodenum abuts the pancreatic head, stable from the prior CT. Small bowel and colon are normal in caliber. No wall thickening. No inflammation. Normal appendix visualized. Lymphatic: No enlarged lymph nodes. Reproductive: Unremarkable. Other: No abdominal wall hernia or abnormality. No abdominopelvic ascites. Musculoskeletal: No fracture or acute finding. No osteoblastic or osteolytic lesions. Review of the MIP images confirms the above findings. IMPRESSION: 1. No acute findings. No evidence of a source for arterial embolization to the lower extremity. 2. Mild aortic atherosclerosis mostly along the abdominal aorta, with no significant stenosis. Aortic branch vessels are widely patent. There is atherosclerosis along the common iliac arteries, right greater than left, also without significant stenosis, and of the common femoral arteries, without significant stenosis. 3. Coronary artery calcifications. 4. 3 mm perifissural nodule in the inferior right upper lobe. No follow-up needed if patient is low-risk. Non-contrast chest CT can be considered in 12 months if patient is high-risk. This recommendation follows the consensus statement: Guidelines  for Management of Incidental Pulmonary Nodules Detected on CT Images: From the Fleischner Society 2017; Radiology 2017; 284:228-243. 5. Bilateral perinephric stranding that is presumed chronic. Nonobstructing stones in the lower pole of the left kidney. Aunt commonly running should Electronically Signed   By: Lajean Manes M.D.   On: 01/24/2020 20:31      Procedures: right foot transmetatarsal amputation  Aortogram   Selection of left common femoral artery and left lower extremity angiogram Right lower extremity angiography  Subjective: Patient is feeling better, pain is well controlled with analgesics, no nausea or vomiting, positive bowel movement.   Discharge Exam: Vitals:   01/29/20 0143  01/29/20 0421  BP: 127/80 120/78  Pulse: 85 86  Resp: 16 18  Temp: 98 F (36.7 C) 98.3 F (36.8 C)  SpO2: 95% 96%   Vitals:   01/28/20 1931 01/28/20 2105 01/29/20 0143 01/29/20 0421  BP:  118/74 127/80 120/78  Pulse:  86 85 86  Resp: (!) 24 18 16 18   Temp:  98.5 F (36.9 C) 98 F (36.7 C) 98.3 F (36.8 C)  TempSrc:  Oral Oral Oral  SpO2:  99% 95% 96%  Weight:      Height:        General: Not in pain or dyspnea.  Neurology: Awake and alert, non focal  E ENT: no pallor, no icterus, oral mucosa moist Cardiovascular: No JVD. S1-S2 present, rhythmic, no gallops, rubs, or murmurs. No lower extremity edema. Pulmonary: positive breath sounds bilaterally, adequate air movement, no wheezing, rhonchi or rales. Gastrointestinal. Abdomen flat, no organomegaly, non tender, no rebound or guarding Skin. 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    The results of significant diagnostics from this hospitalization (including imaging, microbiology, ancillary and laboratory) are listed below for reference.     Microbiology: Recent Results (from the past 240 hour(s))  Culture, blood (routine x 2)     Status: None   Collection Time: 01/21/20  12:20 PM   Specimen: BLOOD  Result Value Ref Range Status   Specimen Description   Final    BLOOD RIGHT ANTECUBITAL Performed at [facility], [address], [place], [place]    Special Requests   Final    BOTTLES DRAWN AEROBIC AND ANAEROBIC Blood Culture results may not be optimal due to an excessive volume of blood received in culture bottles Performed at [facility], [address] [place], [place]    Culture   Final    NO GROWTH 5 DAYS Performed at [facility], [address] [place], [place]    Report Status 01/26/2020 FINAL  Final  Culture, blood (routine x 2)     Status: None   Collection Time: 01/21/20 12:33 PM   Specimen: BLOOD RIGHT HAND  Result Value Ref Range Status   Specimen Description   Final    BLOOD RIGHT HAND Performed at [facility], [address], [place], [place]    Special Requests   Final    BOTTLES DRAWN AEROBIC AND ANAEROBIC Blood Culture adequate volume Performed at [facility], [address] [place], [place]    Culture   Final    NO GROWTH 5 DAYS Performed at [facility], [address] [place], [place]    Report Status 01/26/2020 FINAL  Final  Respiratory Panel by RT PCR (Flu A&B, Covid) - Nasopharyngeal Swab     Status: None   Collection Time: 01/21/20 12:33 PM   Specimen: Nasopharyngeal Swab  Result Value Ref Range Status   SARS Coronavirus 2 by RT PCR NEGATIVE NEGATIVE Final    Comment: (NOTE) SARS-CoV-2 target nucleic acids are NOT DETECTED. The SARS-CoV-2 RNA is generally detectable in upper respiratoy specimens during the acute phase of infection. The lowest concentration of SARS-CoV-2 viral copies this assay can detect is 131 copies/mL. A negative result does not preclude SARS-Cov-2 infection and should not be used as the sole basis for treatment or other patient management  decisions. A negative result may occur with  improper  specimen collection/handling, submission of specimen other than nasopharyngeal swab, presence of viral mutation(s) within the areas targeted by this assay, and inadequate number of viral copies (<131 copies/mL). A negative result must be combined with clinical observations, patient history, and epidemiological information. The expected result is Negative. Fact Sheet for Patients:  https://www.moore.com/ Fact Sheet for Healthcare Providers:  https://www.young.biz/ This test is not yet ap proved or cleared by the [place] FDA and  has been authorized for detection and/or diagnosis of SARS-CoV-2 by FDA under an Emergency Use Authorization (EUA). This EUA will remain  in effect (meaning this test can be used) for the duration of the COVID-19 declaration under Section 564(b)(1) of the Act, 21 U.S.C. section 360bbb-3(b)(1), unless the authorization is terminated or revoked sooner.    Influenza A by PCR NEGATIVE NEGATIVE Final   Influenza B by PCR NEGATIVE NEGATIVE Final    Comment: (NOTE) The Xpert Xpress SARS-CoV-2/FLU/RSV assay is intended as an aid in  the diagnosis of influenza from Nasopharyngeal swab specimens and  should not be used as a sole basis for treatment. Nasal washings and  aspirates are unacceptable for Xpert Xpress SARS-CoV-2/FLU/RSV  testing. Fact Sheet for Patients: https://www.moore.com/ Fact Sheet for Healthcare Providers: https://www.young.biz/ This test is not yet approved or cleared by the [place] FDA and  has been authorized for detection and/or diagnosis of SARS-CoV-2 by  FDA under an Emergency Use Authorization (EUA). This EUA will remain  in effect (meaning this test can be used) for the duration of the  Covid-19 declaration under Section 564(b)(1) of the Act, 21  U.S.C. section 360bbb-3(b)(1), unless the authorization  is  terminated or revoked. Performed at [facility], [address] [place], [place] [address]   Aerobic/Anaerobic Culture (surgical/deep wound)     Status: None   Collection Time: 01/23/20  4:05 PM   Specimen: PATH Soft tissue resection  Result Value Ref Range Status   Specimen Description TISSUE  Final   Special Requests LEFT FOOT  Final   Gram Stain NO WBC SEEN RARE GRAM POSITIVE COCCI   Final   Culture   Final    RARE STAPHYLOCOCCUS AUREUS FEW ENTEROCOCCUS FAECALIS NO ANAEROBES ISOLATED Performed at [facility], [address], [place], [place] [address]    Report Status 01/28/2020 FINAL  Final   Organism ID, Bacteria STAPHYLOCOCCUS AUREUS  Final   Organism ID, Bacteria ENTEROCOCCUS FAECALIS  Final      Susceptibility   Enterococcus faecalis - MIC*    AMPICILLIN <=2 SENSITIVE Sensitive     VANCOMYCIN 2 SENSITIVE Sensitive     GENTAMICIN SYNERGY SENSITIVE Sensitive     * FEW ENTEROCOCCUS FAECALIS   Staphylococcus aureus - MIC*    CIPROFLOXACIN <=0.5 SENSITIVE Sensitive     ERYTHROMYCIN <=0.25 SENSITIVE Sensitive     GENTAMICIN <=0.5 SENSITIVE Sensitive     OXACILLIN <=0.25 SENSITIVE Sensitive     TETRACYCLINE <=1 SENSITIVE Sensitive     VANCOMYCIN 1 SENSITIVE Sensitive     TRIMETH/SULFA <=10 SENSITIVE Sensitive     CLINDAMYCIN <=0.25 SENSITIVE Sensitive     RIFAMPIN <=0.5 SENSITIVE Sensitive     Inducible Clindamycin NEGATIVE Sensitive     * RARE STAPHYLOCOCCUS AUREUS     Labs: BNP (last 3 results) No results for input(s): BNP in the last 8760 hours. Basic Metabolic Panel: Recent Labs  Lab 01/24/20 0538 01/25/20 0239 01/26/20 0842 01/28/20 0543 01/29/20 0136  NA 132* 135 134* 136 137  K 4.0  4.0 3.9 3.9 4.3  CL 101 103 102 101 102  CO2 20* 23 21* 24 23  GLUCOSE 142* 94 139* 128* 107*  BUN 13 17 16 16 20   CREATININE 1.13 1.05 1.02 0.95 1.05  CALCIUM 8.1* 8.2* 8.5* 8.6* 8.7*   Liver Function Tests: No results for input(s):  AST, ALT, ALKPHOS, BILITOT, PROT, ALBUMIN in the last 168 hours. No results for input(s): LIPASE, AMYLASE in the last 168 hours. No results for input(s): AMMONIA in the last 168 hours. CBC: Recent Labs  Lab 01/24/20 0538 01/25/20 0239 01/26/20 0842 01/28/20 0543 01/29/20 0136  WBC 12.6* 12.0* 13.2* 11.3* 11.9*  HGB 12.8* 12.5* 13.1 12.9* 13.1  HCT 38.9* 37.0* 40.4 39.3 39.9  MCV 90.0 89.8 90.8 90.1 90.1  PLT 243 245 280 318 355   Cardiac Enzymes: No results for input(s): CKTOTAL, CKMB, CKMBINDEX, TROPONINI in the last 168 hours. BNP: Invalid input(s): POCBNP CBG: Recent Labs  Lab 01/28/20 0744 01/28/20 1030 01/28/20 1704 01/28/20 2104 01/29/20 0729  GLUCAP 137* 147* 147* 127* 135*   D-Dimer No results for input(s): DDIMER in the last 72 hours. Hgb A1c No results for input(s): HGBA1C in the last 72 hours. Lipid Profile Recent Labs    01/29/20 0136  CHOL 126  HDL 14*  LDLCALC 83  TRIG [id number]  CHOLHDL 9.0   Thyroid function studies No results for input(s): TSH, T4TOTAL, T3FREE, THYROIDAB in the last 72 hours.  Invalid input(s): FREET3 Anemia work up No results for input(s): VITAMINB12, FOLATE, FERRITIN, TIBC, IRON, RETICCTPCT in the last 72 hours. Urinalysis No results found for: COLORURINE, APPEARANCEUR, LABSPEC, PHURINE, GLUCOSEU, HGBUR, BILIRUBINUR, KETONESUR, PROTEINUR, UROBILINOGEN, NITRITE, LEUKOCYTESUR Sepsis Labs Invalid input(s): PROCALCITONIN,  WBC,  LACTICIDVEN Microbiology Recent Results (from the past 240 hour(s))  Culture, blood (routine x 2)     Status: None   Collection Time: 01/21/20 12:20 PM   Specimen: BLOOD  Result Value Ref Range Status   Specimen Description   Final    BLOOD RIGHT ANTECUBITAL Performed at [facility], [address] [place], [place] [address]    Special Requests   Final    BOTTLES DRAWN AEROBIC AND ANAEROBIC Blood Culture results may not be optimal due to an excessive volume of blood received in  culture bottles Performed at [facility], [address] [place], [place] [address]    Culture   Final    NO GROWTH 5 DAYS Performed at [facility], [address] [place], [place] [address]    Report Status 01/26/2020 FINAL  Final  Culture, blood (routine x 2)     Status: None   Collection Time: 01/21/20 12:33 PM   Specimen: BLOOD RIGHT HAND  Result Value Ref Range Status   Specimen Description   Final    BLOOD RIGHT HAND Performed at [facility], [address] [place], [place] [address]    Special Requests   Final    BOTTLES DRAWN AEROBIC AND ANAEROBIC Blood Culture adequate volume Performed at [facility], [address] [place], [place] [address]    Culture   Final    NO GROWTH 5 DAYS Performed at [facility], [address] [place], [place] [address]    Report Status 01/26/2020 FINAL  Final  Respiratory Panel by RT PCR (Flu A&B, Covid) - Nasopharyngeal Swab     Status: None   Collection Time: 01/21/20 12:33 PM   Specimen: Nasopharyngeal Swab  Result Value Ref Range Status   SARS Coronavirus 2 by RT PCR NEGATIVE NEGATIVE Final    Comment: (NOTE) SARS-CoV-2 target nucleic acids are NOT DETECTED. The SARS-CoV-2 RNA is generally detectable in upper respiratoy specimens during the acute phase of infection. The lowest concentration of SARS-CoV-2 viral copies this assay can detect is 131 copies/mL. A negative result does not preclude SARS-Cov-2 infection and should not be used as the sole basis for treatment or other patient management decisions. A negative result may occur with  improper specimen collection/handling, submission of specimen other than nasopharyngeal swab, presence of viral mutation(s) within the areas targeted by this assay, and inadequate number of viral copies (<131 copies/mL). A negative result must be combined with clinical observations, patient history, and epidemiological  information. The expected result is Negative. Fact Sheet for Patients:  https://www.moore.com/ Fact Sheet for Healthcare Providers:  https://www.young.biz/ This test is not yet ap proved or cleared by the [place] FDA and  has been authorized for detection and/or diagnosis of SARS-CoV-2 by FDA under an Emergency Use Authorization (EUA). This EUA will remain  in effect (meaning this test can be used) for the duration of the COVID-19 declaration under Section 564(b)(1) of the Act, 21 U.S.C. section 360bbb-3(b)(1), unless the authorization is terminated or revoked sooner.    Influenza A by PCR NEGATIVE NEGATIVE Final   Influenza B by PCR NEGATIVE NEGATIVE Final    Comment: (NOTE) The Xpert Xpress SARS-CoV-2/FLU/RSV assay is intended as an aid in  the diagnosis of influenza from Nasopharyngeal swab specimens and  should not be used as a sole basis for treatment. Nasal washings and  aspirates are unacceptable for Xpert Xpress SARS-CoV-2/FLU/RSV  testing. Fact Sheet for Patients: https://www.moore.com/ Fact Sheet for Healthcare Providers: https://www.young.biz/ This test is not yet approved or cleared by the [place] FDA and  has been authorized for detection and/or diagnosis of SARS-CoV-2 by  FDA under an Emergency Use Authorization (EUA). This EUA will remain  in effect (meaning this test can be used) for the duration of the  Covid-19 declaration under Section 564(b)(1) of the Act, 21  U.S.C. section 360bbb-3(b)(1), unless the authorization is  terminated or revoked. Performed at [facility], [address] [place], [place] [address]   Aerobic/Anaerobic Culture (surgical/deep wound)     Status: None   Collection Time: 01/23/20  4:05 PM   Specimen: PATH Soft tissue resection  Result Value Ref Range Status   Specimen Description TISSUE  Final   Special Requests LEFT FOOT   Final   Gram Stain NO WBC SEEN RARE GRAM POSITIVE COCCI   Final   Culture   Final    RARE STAPHYLOCOCCUS AUREUS FEW ENTEROCOCCUS FAECALIS NO ANAEROBES ISOLATED Performed at [facility], [address], [place], [place] [address]    Report Status 01/28/2020 FINAL  Final   Organism ID, Bacteria STAPHYLOCOCCUS AUREUS  Final   Organism ID, Bacteria ENTEROCOCCUS FAECALIS  Final      Susceptibility   Enterococcus faecalis - MIC*    AMPICILLIN <=2 SENSITIVE Sensitive     VANCOMYCIN 2 SENSITIVE Sensitive     GENTAMICIN SYNERGY SENSITIVE Sensitive     * FEW ENTEROCOCCUS FAECALIS   Staphylococcus aureus - MIC*    CIPROFLOXACIN <=0.5 SENSITIVE Sensitive     ERYTHROMYCIN <=0.25 SENSITIVE Sensitive     GENTAMICIN <=0.5 SENSITIVE Sensitive     OXACILLIN <=0.25 SENSITIVE Sensitive     TETRACYCLINE <=1 SENSITIVE Sensitive  VANCOMYCIN 1 SENSITIVE Sensitive     TRIMETH/SULFA <=10 SENSITIVE Sensitive     CLINDAMYCIN <=0.25 SENSITIVE Sensitive     RIFAMPIN <=0.5 SENSITIVE Sensitive     Inducible Clindamycin NEGATIVE Sensitive     * RARE STAPHYLOCOCCUS AUREUS     Time coordinating discharge: 45 minutes  SIGNED:   Coralie Keens, MD  [facility] 01/29/2020, 9:48 AM

## 2020-01-29 NOTE — Progress Notes (Signed)
Physical Therapy Treatment Patient Details Name: Brent Lam MRN: [id number] DOB: [birth year] Today's Date: 01/29/2020    History of Present Illness Pt is a 64 y.o. male with L 4th toe infection extending into forefoot. 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 PMH includes DM2, HTN.    PT Comments    Pt requires min guard-(S) for all transfers and functional mobility. Pt wife present for treatment session, observed and was educated on safe stair negotiation. Pt remains impulsive and requires cuing for safety. awareness. Pt to d/c home 01/29/2020 with home health PT to follow up.   Follow Up Recommendations  Home health PT;Supervision for mobility/OOB     Equipment Recommendations  Rolling walker with 5" wheels;3in1 (PT);Wheelchair (measurements PT);Wheelchair cushion (measurements PT)    Recommendations for Other Services       Precautions / Restrictions Precautions Precautions: Fall Precaution Comments: wound vac Restrictions Weight Bearing Restrictions: Yes LLE Weight Bearing: Non weight bearing    Mobility  Bed Mobility Overal bed mobility: Modified Independent             General bed mobility comments: Head of bed elevated  Transfers Overall transfer level: Needs assistance Equipment used: Rolling walker (2 wheeled) Transfers: Sit to/from Stand Sit to Stand: Min guard         General transfer comment: min guard for safety with transfer EOB<>stand, stand<>w/c stand pivot. Pt required VC for decreased impulsivity, safety.  Ambulation/Gait Ambulation/Gait assistance: Min guard Gait Distance (Feet): 2 Feet Assistive device: Rolling walker (2 wheeled) Gait Pattern/deviations: Decreased step length - right;Step-to pattern     General Gait Details: Stand pivot transfer x2 RW bed<> chair, UE support bed rail chair<>bed. NWB LLE precautions maintained. Pushed in transport chair to stairs.   Stairs Stairs: Yes Stairs assistance: Min  guard Stair Management: One rail Right((B) UE) Number of Stairs: 4 General stair comments: Pt hop to pattern to left side on RLE up/down 4 steps. Pt required VC for safety, decreased impulsivity. Pt wife present, Pt educated on safe stair negotiation at home.   Wheelchair Mobility    Modified Rankin (Stroke Patients Only)       Balance Overall balance assessment: Needs assistance Sitting-balance support: Bilateral upper extremity supported Sitting balance-Leahy Scale: Fair     Standing balance support: No upper extremity supported Standing balance-Leahy Scale: Poor Standing balance comment: Pt requires min guard stand pivot, no AD to w/c.                            Cognition Arousal/Alertness: Awake/alert Behavior During Therapy: Flat affect;Impulsive Overall Cognitive Status: Impaired/Different from baseline Area of Impairment: Safety/judgement;Awareness                         Safety/Judgement: Decreased awareness of safety     General Comments: Pt requires VC for saftey with mobility. Pt remains impulsive with functional mobility.      Exercises      General Comments General comments (skin integrity, edema, etc.): (L) LE edema      Pertinent Vitals/Pain Pain Assessment: Faces Faces Pain Scale: Hurts a little bit Pain Location: L foot Pain Intervention(s): Monitored during session    Home Living                      Prior Function            PT Goals (  current goals can now be found in the care plan section) Acute Rehab PT Goals Patient Stated Goal: Return home Potential to Achieve Goals: Good Progress towards PT goals: Goals met/education completed, patient discharged from PT    Frequency    Min 3X/week      PT Plan Current plan remains appropriate    Co-evaluation              AM-PAC PT "6 Clicks" Mobility   Outcome Measure  Help needed turning from your back to your side while in a flat bed without  using bedrails?: None Help needed moving from lying on your back to sitting on the side of a flat bed without using bedrails?: None Help needed moving to and from a bed to a chair (including a wheelchair)?: A Little Help needed standing up from a chair using your arms (e.g., wheelchair or bedside chair)?: A Little Help needed to walk in hospital room?: A Little Help needed climbing 3-5 steps with a railing? : A Little 6 Click Score: 20    End of Session Equipment Utilized During Treatment: Gait belt Activity Tolerance: Patient tolerated treatment well Patient left: in bed;with family/visitor present;with call bell/phone within reach   PT Visit Diagnosis: Other abnormalities of gait and mobility (R26.89);Difficulty in walking, not elsewhere classified (R26.2) Pain - Right/Left: Left Pain - part of body: Ankle and joints of foot     Time: 1355-1403 PT Time Calculation (min) (ACUTE ONLY): 8 min  Charges:  $Therapeutic Activity: 8-22 mins                    Keller Dixon SPT 01/29/2020    Keller Dixon 01/29/2020, 4:26 PM   

## 2020-02-01 ENCOUNTER — Ambulatory Visit (INDEPENDENT_AMBULATORY_CARE_PROVIDER_SITE_OTHER): Payer: BC Managed Care – PPO | Admitting: Family

## 2020-02-01 ENCOUNTER — Encounter: Payer: Self-pay | Admitting: Family

## 2020-02-01 ENCOUNTER — Other Ambulatory Visit: Payer: Self-pay

## 2020-02-01 VITALS — Ht 72.0 in | Wt 210.0 lb

## 2020-02-01 DIAGNOSIS — M869 Osteomyelitis, unspecified: Secondary | ICD-10-CM

## 2020-02-01 MED ORDER — ALPRAZOLAM 0.5 MG PO TABS: 0.5000 mg | ORAL_TABLET | Freq: Three times a day (TID) | ORAL | 0 refills | Status: DC | PRN

## 2020-02-01 NOTE — Progress Notes (Signed)
Post-Op Visit Note   Patient: Brent Lam           Date of Birth: 25-Dec-1955           MRN: [id number] Visit Date: 02/01/2020 PCP: Rusty Aus, MD  Chief Complaint:  Chief Complaint  Patient presents with  . Left Foot - Routine Post Op    01/23/20 left transmet amputation     HPI:  HPI The patient is a 64 year old gentleman seen today 1 week status post left transmetatarsal amputation.  He has been nonweightbearing.  Wound VAC removed today.  Continues to have some issues with some anxiety.  Was treated with Ativan while hospitalized this was not prescribed at discharge.  Wife concerned having agitation and some difficulty sleeping wondering if they could have a short course of Ativan prescribed for the next week  Ortho Exam On examination of the left foot incision the medial aspect is well approximated with sutures and appears to be healing well unfortunately laterally there is some gaping there is some blood clot in the incision bed have expressed some of this is much as possible.  There is some nonviable callused peeling skin to the plantar aspect of the lateral foot there is some maceration and blistering.  Overall there is not significant edema to his foot.  Visit Diagnoses: No diagnosis found.  Plan: Begin daily dose of cleansing.  Dry dressing changes.  Nonweightbearing.  Follow-up in 1 week with Dr. Sharol Given.  Follow-Up Instructions: No follow-ups on file.   Imaging: No results found.  Orders:  No orders of the defined types were placed in this encounter.  No orders of the defined types were placed in this encounter.    PMFS History: Patient Active Problem List   Diagnosis Date Noted  . Gangrene of toe of left foot ([place])   . PVD (peripheral vascular disease) ([place])   . CKD (chronic kidney disease) stage 3, GFR 30-59 ml/min 01/21/2020  . HTN (hypertension) 01/21/2020  . DM2 (diabetes mellitus, type 2) ([place]) 01/21/2020  . Leukocytosis 01/21/2020  . Sepsis ([place])  01/21/2020  . Osteomyelitis of toe of left foot ([place]) 01/21/2020  . Osteoarthritis of toe joint, left 01/21/2020   Past Medical History:  Diagnosis Date  . Diabetes mellitus, type 2 (HCC)    diet controlled  . History of kidney stones    X2 4or 5 yrs ago  . Hypertension    controlled on meds    Family History  Problem Relation Age of Onset  . Diabetes Mother   . Diabetes Father     Past Surgical History:  Procedure Laterality Date  . ABDOMINAL AORTOGRAM W/LOWER EXTREMITY Bilateral 01/28/2020   Procedure: ABDOMINAL AORTOGRAM W/LOWER EXTREMITY;  Surgeon: Waynetta Sandy, MD;  Location: [facility];  Service: Cardiovascular;  Laterality: Bilateral;  . AMPUTATION Left 01/23/2020   Procedure: TRANSMETATARSAL AMPUTATION FOOT;  Surgeon: Newt Minion, MD;  Location: [place] [place];  Service: Orthopedics;  Laterality: Left;  . CATARACT EXTRACTION W/PHACO Left 03/19/2019   Procedure: CATARACT EXTRACTION PHACO AND INTRAOCULAR LENS PLACEMENT (I[facility])  LEFT DIABETIC symfony lens;  Surgeon: Eulogio Bear, MD;  Location: [place] [facility];  Service: Ophthalmology;  Laterality: Left;  Diabetic - oral meds  . CATARACT EXTRACTION W/PHACO Right 04/23/2019   Procedure: CATARACT EXTRACTION PHACO AND INTRAOCULAR LENS PLACEMENT ([place])  RIGHT DIABETIC SYMFONY TORIC LENS;  Surgeon: Eulogio Bear, MD;  Location: [facility];  Service: Ophthalmology;  Laterality: Right;  Diabetes-oral med  . ROTATOR CUFF REPAIR  10/2015   Social History   Occupational History  . Not on file  Tobacco Use  . Smoking status: Never Smoker  . Smokeless tobacco: Never Used  Substance and Sexual Activity  . Alcohol use: Not Currently  . Drug use: Never  . Sexual activity: Not on file

## 2020-02-07 ENCOUNTER — Encounter: Payer: Self-pay | Admitting: Orthopedic Surgery

## 2020-02-07 ENCOUNTER — Ambulatory Visit (INDEPENDENT_AMBULATORY_CARE_PROVIDER_SITE_OTHER): Payer: BC Managed Care – PPO | Admitting: Orthopedic Surgery

## 2020-02-07 ENCOUNTER — Other Ambulatory Visit: Payer: Self-pay

## 2020-02-07 DIAGNOSIS — T8781 Dehiscence of amputation stump: Secondary | ICD-10-CM

## 2020-02-07 DIAGNOSIS — Z89432 Acquired absence of left foot: Secondary | ICD-10-CM

## 2020-02-07 MED ORDER — TRAMADOL HCL 50 MG PO TABS: 50.0000 mg | ORAL_TABLET | Freq: Four times a day (QID) | ORAL | 0 refills | Status: DC | PRN

## 2020-02-07 MED ORDER — NITROGLYCERIN 0.2 MG/HR TD PT24
0.2000 mg | MEDICATED_PATCH | Freq: Every day | TRANSDERMAL | 12 refills | Status: DC
Start: 2020-02-07 — End: 2020-03-06

## 2020-02-07 MED ORDER — PENTOXIFYLLINE ER 400 MG PO TBCR: 400.0000 mg | EXTENDED_RELEASE_TABLET | Freq: Three times a day (TID) | ORAL | 3 refills | Status: DC

## 2020-02-07 NOTE — Progress Notes (Signed)
Office Visit Note   Patient: Brent Lam           Date of Birth: [birth year]           MRN: [id number] Visit Date: 02/07/2020              Requested by: Rusty Aus, MD [place] [facility] [place],  [place] [address] PCP: Rusty Aus, MD  Chief Complaint  Patient presents with  . Left Foot - Routine Post Op      HPI: Patient is status post left transmetatarsal amputation with ischemic changes.  Assessment & Plan: Visit Diagnoses:  1. History of transmetatarsal amputation of left foot ([place])   2. Dehiscence of amputation stump ([place])     Plan: Patient will aggressively work on Achilles stretching this was demonstrated.  Will start Trental and nitroglycerin patch.  Wash with soap and water.  Dry dressing daily.  Nonweightbearing.  Follow-Up Instructions: Return in about 1 week (around 02/14/2020).   Ortho Exam  Patient is alert, oriented, no adenopathy, well-dressed, normal affect, normal respiratory effort. Examination patient does have partial-thickness ischemic changes along the surgical incision with dry gangrenous changes.  Patient does have a palpable dorsalis pedis pulse we will need to start him on Trental and nitroglycerin to help improve his microcirculation.  Continue nonweightbearing he is developing an equinus contracture and stretching exercises were demonstrated in the importance discussed.  Imaging: No results found. No images are attached to the encounter.  Labs: Lab Results  Component Value Date   HGBA1C 7.1 (H) 01/21/2020   REPTSTATUS 01/28/2020 FINAL 01/23/2020   GRAMSTAIN NO WBC SEEN RARE GRAM POSITIVE COCCI  01/23/2020   CULT  01/23/2020    RARE STAPHYLOCOCCUS AUREUS FEW ENTEROCOCCUS FAECALIS NO ANAEROBES ISOLATED Performed at [facility], [place] [address], [place], [place] [address]    [place] 01/23/2020   LABORGA ENTEROCOCCUS FAECALIS 01/23/2020     Lab Results  Component  Value Date   ALBUMIN 3.7 01/21/2020    No results found for: MG No results found for: VD25OH  No results found for: PREALBUMIN CBC EXTENDED Latest Ref Rng & Units 01/29/2020 01/28/2020 01/26/2020  WBC 4.0 - 10.5 K/uL 11.9(H) 11.3(H) 13.2(H)  RBC 4.22 - 5.81 MIL/uL 4.43 4.36 4.45  HGB 13.0 - 17.0 g/dL 13.1 12.9(L) 13.1  HCT 39.0 - 52.0 % 39.9 39.3 40.4  PLT 150 - 400 K/uL 355 318 280  NEUTROABS 1.7 - 7.7 K/uL - - -  LYMPHSABS 0.7 - 4.0 K/uL - - -     There is no height or weight on file to calculate BMI.  Orders:  No orders of the defined types were placed in this encounter.  Meds ordered this encounter  Medications  . nitroGLYCERIN (NITRODUR - DOSED IN MG/24 HR) 0.2 mg/hr patch    Sig: Place 1 patch (0.2 mg total) onto the skin daily.    Dispense:  30 patch    Refill:  12  . pentoxifylline (TRENTAL) 400 MG CR tablet    Sig: Take 1 tablet (400 mg total) by mouth 3 (three) times daily with meals.    Dispense:  90 tablet    Refill:  3  . traMADol (ULTRAM) 50 MG tablet    Sig: Take 1 tablet (50 mg total) by mouth every 6 (six) hours as needed for severe pain.    Dispense:  20 tablet    Refill:  0  Procedures: No procedures performed  Clinical Data: No additional findings.  ROS:  All other systems negative, except as noted in the HPI. Review of Systems  Objective: Vital Signs: There were no vitals taken for this visit.  Specialty Comments:  No specialty comments available.  PMFS History: Patient Active Problem List   Diagnosis Date Noted  . Gangrene of toe of left foot (HCC)   . PVD (peripheral vascular disease) (HCC)   . CKD (chronic kidney disease) stage 3, GFR 30-59 ml/min 01/21/2020  . HTN (hypertension) 01/21/2020  . DM2 (diabetes mellitus, type 2) (HCC) 01/21/2020  . Leukocytosis 01/21/2020  . Sepsis (HCC) 01/21/2020  . Osteomyelitis of toe of left foot (HCC) 01/21/2020  . Osteoarthritis of toe joint, left 01/21/2020   Past Medical History:   Diagnosis Date  . Diabetes mellitus, type 2 (HCC)    diet controlled  . History of kidney stones    X2 4or 5 yrs ago  . Hypertension    controlled on meds    Family History  Problem Relation Age of Onset  . Diabetes Mother   . Diabetes Father     Past Surgical History:  Procedure Laterality Date  . ABDOMINAL AORTOGRAM W/LOWER EXTREMITY Bilateral 01/28/2020   Procedure: ABDOMINAL AORTOGRAM W/LOWER EXTREMITY;  Surgeon: Maeola Harman, MD;  Location: [facility] INVASIVE CV LAB;  Service: Cardiovascular;  Laterality: Bilateral;  . AMPUTATION Left 01/23/2020   Procedure: TRANSMETATARSAL AMPUTATION FOOT;  Surgeon: Nadara Mustard, MD;  Location: [facility] OR;  Service: Orthopedics;  Laterality: Left;  . CATARACT EXTRACTION W/PHACO Left 03/19/2019   Procedure: CATARACT EXTRACTION PHACO AND INTRAOCULAR LENS PLACEMENT (IOC)  LEFT DIABETIC symfony lens;  Surgeon: [place] Crane, MD;  Location: [facility];  Service: Ophthalmology;  Laterality: Left;  Diabetic - oral meds  . CATARACT EXTRACTION W/PHACO Right 04/23/2019   Procedure: CATARACT EXTRACTION PHACO AND INTRAOCULAR LENS PLACEMENT (IOC)  RIGHT DIABETIC SYMFONY TORIC LENS;  Surgeon: Nevada Crane, MD;  Location: [facility];  Service: Ophthalmology;  Laterality: Right;  Diabetes-oral med  . ROTATOR CUFF REPAIR  10/2015   Social History   Occupational History  . Not on file  Tobacco Use  . Smoking status: Never Smoker  . Smokeless tobacco: Never Used  Substance and Sexual Activity  . Alcohol use: Not Currently  . Drug use: Never  . Sexual activity: Not on file

## 2020-02-14 ENCOUNTER — Ambulatory Visit (INDEPENDENT_AMBULATORY_CARE_PROVIDER_SITE_OTHER): Payer: BC Managed Care – PPO | Admitting: Orthopedic Surgery

## 2020-02-14 ENCOUNTER — Encounter: Payer: Self-pay | Admitting: Orthopedic Surgery

## 2020-02-14 ENCOUNTER — Other Ambulatory Visit: Payer: Self-pay

## 2020-02-14 VITALS — Ht 72.0 in | Wt 210.0 lb

## 2020-02-14 DIAGNOSIS — Z89432 Acquired absence of left foot: Secondary | ICD-10-CM

## 2020-02-14 DIAGNOSIS — T8781 Dehiscence of amputation stump: Secondary | ICD-10-CM

## 2020-02-14 NOTE — Progress Notes (Signed)
Office Visit Note   Patient: Brent Lam           Date of Birth: [birth year]           MRN: [id number] Visit Date: 02/14/2020              Requested by: Rusty Aus, MD [place] [facility] [place],  [place] [address] PCP: Rusty Aus, MD  Chief Complaint  Patient presents with  . Left Foot - Follow-up, Routine Post Op    01/23/20 Left foot transmetatarsal amputation      HPI: Patient is a 64 year old gentleman who is 3 weeks status post left transmetatarsal amputation he is currently using a nitroglycerin patch and Trental.  Patient notices that he is having increased bleeding over the lateral aspect of the ischemic wound.  Assessment & Plan: Visit Diagnoses:  1. History of transmetatarsal amputation of left foot ([place])   2. Dehiscence of amputation stump ([place])     Plan: Patient is making slow steady progress continue the nitroglycerin patch continue with Trental continue with Dial soap cleansing.  Follow-Up Instructions: Return in about 1 week (around 02/21/2020).   Ortho Exam  Patient is alert, oriented, no adenopathy, well-dressed, normal affect, normal respiratory effort. Examination beneath some of the black eschar patient has new epithelial skin.  There is no cellulitis there is some bleeding laterally patient is showing good steady improvement in wound healing.  Imaging: No results found. No images are attached to the encounter.  Labs: Lab Results  Component Value Date   HGBA1C 7.1 (H) 01/21/2020   REPTSTATUS 01/28/2020 FINAL 01/23/2020   GRAMSTAIN NO WBC SEEN RARE GRAM POSITIVE COCCI  01/23/2020   CULT  01/23/2020    RARE STAPHYLOCOCCUS AUREUS FEW ENTEROCOCCUS FAECALIS NO ANAEROBES ISOLATED Performed at [facility], [place] [address], [place], [place] [address]    [place] 01/23/2020   LABORGA ENTEROCOCCUS FAECALIS 01/23/2020     Lab Results  Component Value Date   ALBUMIN 3.7  01/21/2020    No results found for: MG No results found for: VD25OH  No results found for: PREALBUMIN CBC EXTENDED Latest Ref Rng & Units 01/29/2020 01/28/2020 01/26/2020  WBC 4.0 - 10.5 K/uL 11.9(H) 11.3(H) 13.2(H)  RBC 4.22 - 5.81 MIL/uL 4.43 4.36 4.45  HGB 13.0 - 17.0 g/dL 13.1 12.9(L) 13.1  HCT 39.0 - 52.0 % 39.9 39.3 40.4  PLT 150 - 400 K/uL 355 318 280  NEUTROABS 1.7 - 7.7 K/uL - - -  LYMPHSABS 0.7 - 4.0 K/uL - - -     Body mass index is 28.48 kg/m.  Orders:  No orders of the defined types were placed in this encounter.  No orders of the defined types were placed in this encounter.    Procedures: No procedures performed  Clinical Data: No additional findings.  ROS:  All other systems negative, except as noted in the HPI. Review of Systems  Objective: Vital Signs: Ht 6' (1.829 m)   Wt 210 lb (95.3 kg)   BMI 28.48 kg/m   Specialty Comments:  No specialty comments available.  PMFS History: Patient Active Problem List   Diagnosis Date Noted  . Gangrene of toe of left foot ([place])   . PVD (peripheral vascular disease) ([place])   . CKD (chronic kidney disease) stage 3, GFR 30-59 ml/min 01/21/2020  . HTN (hypertension) 01/21/2020  . DM2 (diabetes mellitus, type 2) ([place]) 01/21/2020  . Leukocytosis 01/21/2020  .  Sepsis (HCC) 01/21/2020  . Osteomyelitis of toe of left foot (HCC) 01/21/2020  . Osteoarthritis of toe joint, left 01/21/2020   Past Medical History:  Diagnosis Date  . Diabetes mellitus, type 2 (HCC)    diet controlled  . History of kidney stones    X2 4or 5 yrs ago  . Hypertension    controlled on meds    Family History  Problem Relation Age of Onset  . Diabetes Mother   . Diabetes Father     Past Surgical History:  Procedure Laterality Date  . ABDOMINAL AORTOGRAM W/LOWER EXTREMITY Bilateral 01/28/2020   Procedure: ABDOMINAL AORTOGRAM W/LOWER EXTREMITY;  Surgeon: Maeola Harman, MD;  Location: [facility] INVASIVE CV LAB;  Service:  Cardiovascular;  Laterality: Bilateral;  . AMPUTATION Left 01/23/2020   Procedure: TRANSMETATARSAL AMPUTATION FOOT;  Surgeon: Nadara Mustard, MD;  Location: [facility] OR;  Service: Orthopedics;  Laterality: Left;  . CATARACT EXTRACTION W/PHACO Left 03/19/2019   Procedure: CATARACT EXTRACTION PHACO AND INTRAOCULAR LENS PLACEMENT (IOC)  LEFT DIABETIC symfony lens;  Surgeon: Nevada Crane, MD;  Location: [facility];  Service: Ophthalmology;  Laterality: Left;  Diabetic - oral meds  . CATARACT EXTRACTION W/PHACO Right 04/23/2019   Procedure: CATARACT EXTRACTION PHACO AND INTRAOCULAR LENS PLACEMENT (IOC)  RIGHT DIABETIC SYMFONY TORIC LENS;  Surgeon: Nevada Crane, MD;  Location: [facility];  Service: Ophthalmology;  Laterality: Right;  Diabetes-oral med  . ROTATOR CUFF REPAIR  10/2015   Social History   Occupational History  . Not on file  Tobacco Use  . Smoking status: Never Smoker  . Smokeless tobacco: Never Used  Substance and Sexual Activity  . Alcohol use: Not Currently  . Drug use: Never  . Sexual activity: Not on file

## 2020-02-21 ENCOUNTER — Other Ambulatory Visit: Payer: Self-pay

## 2020-02-21 ENCOUNTER — Encounter: Payer: Self-pay | Admitting: Orthopedic Surgery

## 2020-02-21 ENCOUNTER — Ambulatory Visit (INDEPENDENT_AMBULATORY_CARE_PROVIDER_SITE_OTHER): Payer: BC Managed Care – PPO | Admitting: Orthopedic Surgery

## 2020-02-21 VITALS — Ht 72.0 in | Wt 210.0 lb

## 2020-02-21 DIAGNOSIS — Z89432 Acquired absence of left foot: Secondary | ICD-10-CM

## 2020-02-21 DIAGNOSIS — T8781 Dehiscence of amputation stump: Secondary | ICD-10-CM

## 2020-02-25 ENCOUNTER — Encounter: Payer: Self-pay | Admitting: Orthopedic Surgery

## 2020-02-25 NOTE — Progress Notes (Signed)
Office Visit Note   Patient: Brent Lam           Date of Birth: 02-Jun-1956           MRN: [id number] Visit Date: 02/21/2020              Requested by: Danella Penton, MD [phone] [facility] [address] [facility] [place],  [place] [address] PCP: Danella Penton, MD  Chief Complaint  Patient presents with  . Left Foot - Routine Post Op    01/23/20 left foot transmet amputation       HPI: Patient is a 64 year old gentleman who presents 4 weeks status post left transmetatarsal amputation.  Patient has had some wound dehiscence laterally.  Patient states he has completed his course of Augmentin.  Assessment & Plan: Visit Diagnoses:  1. History of transmetatarsal amputation of left foot (HCC)   2. Dehiscence of amputation stump (HCC)     Plan: Recommended continued protected weightbearing compression dry dressing elevation range of motion of the Achilles.  Follow-Up Instructions: Return in about 2 weeks (around 03/06/2020).   Ortho Exam  Patient is alert, oriented, no adenopathy, well-dressed, normal affect, normal respiratory effort. Examination there is some eschar over the incision this is removed there is improved epithelization.  Sutures are also harvested today.  Imaging: No results found. No images are attached to the encounter.  Labs: Lab Results  Component Value Date   HGBA1C 7.1 (H) 01/21/2020   REPTSTATUS 01/28/2020 FINAL 01/23/2020   GRAMSTAIN NO WBC SEEN RARE GRAM POSITIVE COCCI  01/23/2020   CULT  01/23/2020    RARE STAPHYLOCOCCUS AUREUS FEW ENTEROCOCCUS FAECALIS NO ANAEROBES ISOLATED Performed at [facility], [address] [place], [place] [address]    [facility] STAPHYLOCOCCUS AUREUS 01/23/2020   LABORGA ENTEROCOCCUS FAECALIS 01/23/2020     Lab Results  Component Value Date   ALBUMIN 3.7 01/21/2020    No results found for: MG No results found for: VD25OH  No results found for: PREALBUMIN CBC EXTENDED Latest Ref Rng & Units  01/29/2020 01/28/2020 01/26/2020  WBC 4.0 - 10.5 K/uL 11.9(H) 11.3(H) 13.2(H)  RBC 4.22 - 5.81 MIL/uL 4.43 4.36 4.45  HGB 13.0 - 17.0 g/dL 24.5 12.9(L) 13.1  HCT 39.0 - 52.0 % 39.9 39.3 40.4  PLT 150 - 400 K/uL 355 318 280  NEUTROABS 1.7 - 7.7 K/uL - - -  LYMPHSABS 0.7 - 4.0 K/uL - - -     Body mass index is 28.48 kg/m.  Orders:  No orders of the defined types were placed in this encounter.  No orders of the defined types were placed in this encounter.    Procedures: No procedures performed  Clinical Data: No additional findings.  ROS:  All other systems negative, except as noted in the HPI. Review of Systems  Objective: Vital Signs: Ht 6' (1.829 m)   Wt 210 lb (95.3 kg)   BMI 28.48 kg/m   Specialty Comments:  No specialty comments available.  PMFS History: Patient Active Problem List   Diagnosis Date Noted  . Gangrene of toe of left foot (HCC)   . PVD (peripheral vascular disease) (HCC)   . CKD (chronic kidney disease) stage 3, GFR 30-59 ml/min 01/21/2020  . HTN (hypertension) 01/21/2020  . DM2 (diabetes mellitus, type 2) (HCC) 01/21/2020  . Leukocytosis 01/21/2020  . Sepsis (HCC) 01/21/2020  . Osteomyelitis of toe of left foot (HCC) 01/21/2020  . Osteoarthritis of toe joint, left 01/21/2020  Past Medical History:  Diagnosis Date  . Diabetes mellitus, type 2 (HCC)    diet controlled  . History of kidney stones    X2 4or 5 yrs ago  . Hypertension    controlled on meds    Family History  Problem Relation Age of Onset  . Diabetes Mother   . Diabetes Father     Past Surgical History:  Procedure Laterality Date  . ABDOMINAL AORTOGRAM W/LOWER EXTREMITY Bilateral 01/28/2020   Procedure: ABDOMINAL AORTOGRAM W/LOWER EXTREMITY;  Surgeon: Waynetta Sandy, MD;  Location: [place] CV LAB;  Service: Cardiovascular;  Laterality: Bilateral;  . AMPUTATION Left 01/23/2020   Procedure: TRANSMETATARSAL AMPUTATION FOOT;  Surgeon: Newt Minion, MD;   Location: [place];  Service: Orthopedics;  Laterality: Left;  . CATARACT EXTRACTION W/PHACO Left 03/19/2019   Procedure: CATARACT EXTRACTION PHACO AND INTRAOCULAR LENS PLACEMENT (IOC)  LEFT DIABETIC symfony lens;  Surgeon: Eulogio Bear, MD;  Location: [place];  Service: Ophthalmology;  Laterality: Left;  Diabetic - oral meds  . CATARACT EXTRACTION W/PHACO Right 04/23/2019   Procedure: CATARACT EXTRACTION PHACO AND INTRAOCULAR LENS PLACEMENT ([place])  RIGHT DIABETIC SYMFONY TORIC LENS;  Surgeon: Eulogio Bear, MD;  Location: [place];  Service: Ophthalmology;  Laterality: Right;  Diabetes-oral med  . ROTATOR CUFF REPAIR  10/2015   Social History   Occupational History  . Not on file  Tobacco Use  . Smoking status: Never Smoker  . Smokeless tobacco: Never Used  Substance and Sexual Activity  . Alcohol use: Not Currently  . Drug use: Never  . Sexual activity: Not on file

## 2020-03-06 ENCOUNTER — Encounter: Payer: Self-pay | Admitting: Orthopedic Surgery

## 2020-03-06 ENCOUNTER — Other Ambulatory Visit: Payer: Self-pay

## 2020-03-06 ENCOUNTER — Ambulatory Visit (INDEPENDENT_AMBULATORY_CARE_PROVIDER_SITE_OTHER): Payer: BC Managed Care – PPO | Admitting: Orthopedic Surgery

## 2020-03-06 VITALS — Ht 72.0 in | Wt 210.0 lb

## 2020-03-06 DIAGNOSIS — Z89432 Acquired absence of left foot: Secondary | ICD-10-CM

## 2020-03-06 MED ORDER — SILVER SULFADIAZINE 1 % EX CREA
1.0000 | TOPICAL_CREAM | Freq: Every day | CUTANEOUS | 3 refills | Status: DC
Start: 2020-03-06 — End: 2023-03-18

## 2020-03-06 MED ORDER — NITROGLYCERIN 0.2 MG/HR TD PT24: 0.2000 mg | MEDICATED_PATCH | Freq: Every day | TRANSDERMAL | 12 refills | Status: DC

## 2020-03-09 ENCOUNTER — Other Ambulatory Visit: Payer: Self-pay | Admitting: Orthopedic Surgery

## 2020-03-13 ENCOUNTER — Encounter: Payer: Self-pay | Admitting: Orthopedic Surgery

## 2020-03-13 ENCOUNTER — Ambulatory Visit (INDEPENDENT_AMBULATORY_CARE_PROVIDER_SITE_OTHER): Payer: BC Managed Care – PPO | Admitting: Physician Assistant

## 2020-03-13 ENCOUNTER — Other Ambulatory Visit: Payer: Self-pay

## 2020-03-13 VITALS — Ht 72.0 in | Wt 210.0 lb

## 2020-03-13 DIAGNOSIS — Z89432 Acquired absence of left foot: Secondary | ICD-10-CM

## 2020-03-13 NOTE — Progress Notes (Signed)
Office Visit Note   Patient: Brent Lam           Date of Birth: [birth year]           MRN: [id number] Visit Date: 03/13/2020              Requested by: Danella Penton, MD [phone] [facility] [address] [facility] [place],  [place] [address] PCP: Danella Penton, MD  Chief Complaint  Patient presents with  . Left Foot - Routine Post Op    01/23/20 left foot transmet amputation       HPI: This is a pleasant gentleman who is 6 weeks status post left foot transmetatarsal amputation.  He has had some wound dehiscence and difficulty healing.  He has been using Trental and a nitro patch.  He has been doing daily dressing changes with Silvadene.  He and his wife do think it has improved  Assessment & Plan: Visit Diagnoses: No diagnosis found.  Plan: We will continue to do dressing changes daily.  I did demonstrate to them how much Silvadene should be used and told him the easiest way to do this was to put it on the dressing and then place the dressing onto the wound.  They will follow-up in 1 week  Follow-Up Instructions: No follow-ups on file.   Ortho Exam  Patient is alert, oriented, no adenopathy, well-dressed, normal affect, normal respiratory effort. Focused examination demonstrates no foul odor no surrounding cellulitis he does have some necrotic tissue with was debrided after consent.  He does have some fibrinous exudate of tissue that is debrided down to a healthy surface.  No purulent drainage no fluctuance  Imaging: No results found. No images are attached to the encounter.  Labs: Lab Results  Component Value Date   HGBA1C 7.1 (H) 01/21/2020   REPTSTATUS 01/28/2020 FINAL 01/23/2020   GRAMSTAIN NO WBC SEEN RARE GRAM POSITIVE COCCI  01/23/2020   CULT  01/23/2020    RARE STAPHYLOCOCCUS AUREUS FEW ENTEROCOCCUS FAECALIS NO ANAEROBES ISOLATED Performed at [facility], [address], [place], [place] [address]    [facility] STAPHYLOCOCCUS AUREUS  01/23/2020   LABORGA ENTEROCOCCUS FAECALIS 01/23/2020     Lab Results  Component Value Date   ALBUMIN 3.7 01/21/2020    No results found for: MG No results found for: VD25OH  No results found for: PREALBUMIN CBC EXTENDED Latest Ref Rng & Units 01/29/2020 01/28/2020 01/26/2020  WBC 4.0 - 10.5 K/uL 11.9(H) 11.3(H) 13.2(H)  RBC 4.22 - 5.81 MIL/uL 4.43 4.36 4.45  HGB 13.0 - 17.0 g/dL 07.3 12.9(L) 13.1  HCT 39 - 52 % 39.9 39.3 40.4  PLT 150 - 400 K/uL 355 318 280  NEUTROABS 1.7 - 7.7 K/uL - - -  LYMPHSABS 0.7 - 4.0 K/uL - - -     Body mass index is 28.48 kg/m.  Orders:  No orders of the defined types were placed in this encounter.  No orders of the defined types were placed in this encounter.    Procedures: No procedures performed  Clinical Data: No additional findings.  ROS:  All other systems negative, except as noted in the HPI. Review of Systems  Objective: Vital Signs: Ht 6' (1.829 m)   Wt 210 lb (95.3 kg)   BMI 28.48 kg/m   Specialty Comments:  No specialty comments available.  PMFS History: Patient Active Problem List   Diagnosis Date Noted  . Gangrene of toe of left foot (HCC)   .  PVD (peripheral vascular disease) ([place])   . CKD (chronic kidney disease) stage 3, GFR 30-59 ml/min 01/21/2020  . HTN (hypertension) 01/21/2020  . DM2 (diabetes mellitus, type 2) ([place]) 01/21/2020  . Leukocytosis 01/21/2020  . Sepsis ([place]) 01/21/2020  . Osteomyelitis of toe of left foot ([place]) 01/21/2020  . Osteoarthritis of toe joint, left 01/21/2020   Past Medical History:  Diagnosis Date  . Diabetes mellitus, type 2 (HCC)    diet controlled  . History of kidney stones    X2 4or 5 yrs ago  . Hypertension    controlled on meds    Family History  Problem Relation Age of Onset  . Diabetes Mother   . Diabetes Father     Past Surgical History:  Procedure Laterality Date  . ABDOMINAL AORTOGRAM W/LOWER EXTREMITY Bilateral 01/28/2020   Procedure: ABDOMINAL AORTOGRAM  W/LOWER EXTREMITY;  Surgeon: Waynetta Sandy, MD;  Location: [place] CV LAB;  Service: Cardiovascular;  Laterality: Bilateral;  . AMPUTATION Left 01/23/2020   Procedure: TRANSMETATARSAL AMPUTATION FOOT;  Surgeon: Newt Minion, MD;  Location: [place];  Service: Orthopedics;  Laterality: Left;  . CATARACT EXTRACTION W/PHACO Left 03/19/2019   Procedure: CATARACT EXTRACTION PHACO AND INTRAOCULAR LENS PLACEMENT (IOC)  LEFT DIABETIC symfony lens;  Surgeon: Eulogio Bear, MD;  Location: [place];  Service: Ophthalmology;  Laterality: Left;  Diabetic - oral meds  . CATARACT EXTRACTION W/PHACO Right 04/23/2019   Procedure: CATARACT EXTRACTION PHACO AND INTRAOCULAR LENS PLACEMENT ([place])  RIGHT DIABETIC SYMFONY TORIC LENS;  Surgeon: Eulogio Bear, MD;  Location: [place];  Service: Ophthalmology;  Laterality: Right;  Diabetes-oral med  . ROTATOR CUFF REPAIR  10/2015   Social History   Occupational History  . Not on file  Tobacco Use  . Smoking status: Never Smoker  . Smokeless tobacco: Never Used  Vaping Use  . Vaping Use: Never used  Substance and Sexual Activity  . Alcohol use: Not Currently  . Drug use: Never  . Sexual activity: Not on file

## 2020-03-13 NOTE — Progress Notes (Signed)
Office Visit Note   Patient: Brent Lam           Date of Birth: 01/07/1956           MRN: [id number] Visit Date: 03/06/2020              Requested by: Danella Penton, MD [phone] [facility] [address] [facility] [place],  [place] [address] PCP: Danella Penton, MD  Chief Complaint  Patient presents with  . Left Foot - Routine Post Op    01/23/20 left foot transmet amputation         HPI: Patient is a 64 year old gentleman who presents in follow-up for left transmetatarsal amputation.  Patient states he has some necrotic tissue is using a nitroglycerin patch.  Assessment & Plan: Visit Diagnoses:  1. History of transmetatarsal amputation of left foot (HCC)     Plan: The wound was debrided prescription was provided for Silvadene and a new prescription for nitroglycerin patch  Follow-Up Instructions: Return in about 1 week (around 03/13/2020).   Ortho Exam  Patient is alert, oriented, no adenopathy, well-dressed, normal affect, normal respiratory effort. Examination there is good bleeding along the wound edges.  Patient underwent sharp debridement back to healthy viable granulation tissue there is no exposed bone or tendon.  Silver nitrate was used for hemostasis.  Imaging: No results found. No images are attached to the encounter.  Labs: Lab Results  Component Value Date   HGBA1C 7.1 (H) 01/21/2020   REPTSTATUS 01/28/2020 FINAL 01/23/2020   GRAMSTAIN NO WBC SEEN RARE GRAM POSITIVE COCCI  01/23/2020   CULT  01/23/2020    RARE STAPHYLOCOCCUS AUREUS FEW ENTEROCOCCUS FAECALIS NO ANAEROBES ISOLATED Performed at [facility], [address] [place], [place] [address]    [facility] STAPHYLOCOCCUS AUREUS 01/23/2020   LABORGA ENTEROCOCCUS FAECALIS 01/23/2020     Lab Results  Component Value Date   ALBUMIN 3.7 01/21/2020    No results found for: MG No results found for: VD25OH  No results found for: PREALBUMIN CBC EXTENDED Latest Ref Rng & Units  01/29/2020 01/28/2020 01/26/2020  WBC 4.0 - 10.5 K/uL 11.9(H) 11.3(H) 13.2(H)  RBC 4.22 - 5.81 MIL/uL 4.43 4.36 4.45  HGB 13.0 - 17.0 g/dL 37.0 12.9(L) 13.1  HCT 39 - 52 % 39.9 39.3 40.4  PLT 150 - 400 K/uL 355 318 280  NEUTROABS 1.7 - 7.7 K/uL - - -  LYMPHSABS 0.7 - 4.0 K/uL - - -     Body mass index is 28.48 kg/m.  Orders:  No orders of the defined types were placed in this encounter.  Meds ordered this encounter  Medications  . nitroGLYCERIN (NITRODUR - DOSED IN MG/24 HR) 0.2 mg/hr patch    Sig: Place 1 patch (0.2 mg total) onto the skin daily.    Dispense:  30 patch    Refill:  12  . silver sulfADIAZINE (SILVADENE) 1 % cream    Sig: Apply 1 application topically daily. Apply to affected area daily plus dry dressing    Dispense:  400 g    Refill:  3     Procedures: No procedures performed  Clinical Data: No additional findings.  ROS:  All other systems negative, except as noted in the HPI. Review of Systems  Objective: Vital Signs: Ht 6' (1.829 m)   Wt 210 lb (95.3 kg)   BMI 28.48 kg/m   Specialty Comments:  No specialty comments available.  PMFS History: Patient Active Problem List  Diagnosis Date Noted  . Gangrene of toe of left foot ([place])   . PVD (peripheral vascular disease) ([place])   . CKD (chronic kidney disease) stage 3, GFR 30-59 ml/min 01/21/2020  . HTN (hypertension) 01/21/2020  . DM2 (diabetes mellitus, type 2) ([place]) 01/21/2020  . Leukocytosis 01/21/2020  . Sepsis ([place]) 01/21/2020  . Osteomyelitis of toe of left foot ([place]) 01/21/2020  . Osteoarthritis of toe joint, left 01/21/2020   Past Medical History:  Diagnosis Date  . Diabetes mellitus, type 2 (HCC)    diet controlled  . History of kidney stones    X2 4or 5 yrs ago  . Hypertension    controlled on meds    Family History  Problem Relation Age of Onset  . Diabetes Mother   . Diabetes Father     Past Surgical History:  Procedure Laterality Date  . ABDOMINAL AORTOGRAM W/LOWER  EXTREMITY Bilateral 01/28/2020   Procedure: ABDOMINAL AORTOGRAM W/LOWER EXTREMITY;  Surgeon: Waynetta Sandy, MD;  Location: [place] [facility] LAB;  Service: Cardiovascular;  Laterality: Bilateral;  . AMPUTATION Left 01/23/2020   Procedure: TRANSMETATARSAL AMPUTATION FOOT;  Surgeon: Newt Minion, MD;  Location: [place];  Service: Orthopedics;  Laterality: Left;  . CATARACT EXTRACTION W/PHACO Left 03/19/2019   Procedure: CATARACT EXTRACTION PHACO AND INTRAOCULAR LENS PLACEMENT (IOC)  LEFT DIABETIC symfony lens;  Surgeon: Eulogio Bear, MD;  Location: [place];  Service: Ophthalmology;  Laterality: Left;  Diabetic - oral meds  . CATARACT EXTRACTION W/PHACO Right 04/23/2019   Procedure: CATARACT EXTRACTION PHACO AND INTRAOCULAR LENS PLACEMENT ([place])  RIGHT DIABETIC SYMFONY TORIC LENS;  Surgeon: Eulogio Bear, MD;  Location: [place];  Service: Ophthalmology;  Laterality: Right;  Diabetes-oral med  . ROTATOR CUFF REPAIR  10/2015   Social History   Occupational History  . Not on file  Tobacco Use  . Smoking status: Never Smoker  . Smokeless tobacco: Never Used  Vaping Use  . Vaping Use: Never used  Substance and Sexual Activity  . Alcohol use: Not Currently  . Drug use: Never  . Sexual activity: Not on file

## 2020-03-20 ENCOUNTER — Ambulatory Visit (INDEPENDENT_AMBULATORY_CARE_PROVIDER_SITE_OTHER): Payer: BC Managed Care – PPO | Admitting: Orthopedic Surgery

## 2020-03-20 ENCOUNTER — Encounter: Payer: Self-pay | Admitting: Orthopedic Surgery

## 2020-03-20 ENCOUNTER — Other Ambulatory Visit: Payer: Self-pay

## 2020-03-20 VITALS — Ht 72.0 in | Wt 210.0 lb

## 2020-03-20 DIAGNOSIS — Z89432 Acquired absence of left foot: Secondary | ICD-10-CM

## 2020-03-20 NOTE — Progress Notes (Signed)
Office Visit Note   Patient: Brent Lam           Date of Birth: 04/23/1956           MRN: [id number] Visit Date: 03/20/2020              Requested by: Rusty Aus, MD [facility] [place],  [place] [address] PCP: Rusty Aus, MD  Chief Complaint  Patient presents with  . Left Foot - Routine Post Op    01/23/20 left foot transmet amputation         HPI: Patient is a 64 year old gentleman who presents in follow-up status post transmetatarsal amputation on the left.  He is currently using the nitroglycerin patch Silvadene dressing changes and using Trental  Assessment & Plan: Visit Diagnoses:  1. History of transmetatarsal amputation of left foot ([place])     Plan: Continue current treatment decreased significantly the amount of Silvadene.  Iodosorb dressing applied today.  Follow-Up Instructions: Return in about 1 week (around 03/27/2020).   Ortho Exam  Patient is alert, oriented, no adenopathy, well-dressed, normal affect, normal respiratory effort. Examination patient's lateral wound to the transmetatarsal amputation is 7 x 2 cm there is 50% granulation tissue the skin in the forefoot is macerated from too much Silvadene.  Imaging: No results found. No images are attached to the encounter.  Labs: Lab Results  Component Value Date   HGBA1C 7.1 (H) 01/21/2020   REPTSTATUS 01/28/2020 FINAL 01/23/2020   GRAMSTAIN NO WBC SEEN RARE GRAM POSITIVE COCCI  01/23/2020   CULT  01/23/2020    RARE STAPHYLOCOCCUS AUREUS FEW ENTEROCOCCUS FAECALIS NO ANAEROBES ISOLATED Performed at [facility], [place] [address], [place], [place] [address]    [place] 01/23/2020   LABORGA ENTEROCOCCUS FAECALIS 01/23/2020     Lab Results  Component Value Date   ALBUMIN 3.7 01/21/2020    No results found for: MG No results found for: VD25OH  No results found for: PREALBUMIN CBC EXTENDED Latest Ref Rng & Units  01/29/2020 01/28/2020 01/26/2020  WBC 4.0 - 10.5 K/uL 11.9(H) 11.3(H) 13.2(H)  RBC 4.22 - 5.81 MIL/uL 4.43 4.36 4.45  HGB 13.0 - 17.0 g/dL 13.1 12.9(L) 13.1  HCT 39 - 52 % 39.9 39.3 40.4  PLT 150 - 400 K/uL 355 318 280  NEUTROABS 1.7 - 7.7 K/uL - - -  LYMPHSABS 0.7 - 4.0 K/uL - - -     Body mass index is 28.48 kg/m.  Orders:  No orders of the defined types were placed in this encounter.  No orders of the defined types were placed in this encounter.    Procedures: No procedures performed  Clinical Data: No additional findings.  ROS:  All other systems negative, except as noted in the HPI. Review of Systems  Objective: Vital Signs: Ht 6' (1.829 m)   Wt 210 lb (95.3 kg)   BMI 28.48 kg/m   Specialty Comments:  No specialty comments available.  PMFS History: Patient Active Problem List   Diagnosis Date Noted  . Gangrene of toe of left foot ([place])   . PVD (peripheral vascular disease) ([place])   . CKD (chronic kidney disease) stage 3, GFR 30-59 ml/min 01/21/2020  . HTN (hypertension) 01/21/2020  . DM2 (diabetes mellitus, type 2) ([place]) 01/21/2020  . Leukocytosis 01/21/2020  . Sepsis ([place]) 01/21/2020  . Osteomyelitis of toe of left foot ([place]) 01/21/2020  . Osteoarthritis of toe joint, left 01/21/2020  Past Medical History:  Diagnosis Date  . Diabetes mellitus, type 2 (HCC)    diet controlled  . History of kidney stones    X2 4or 5 yrs ago  . Hypertension    controlled on meds    Family History  Problem Relation Age of Onset  . Diabetes Mother   . Diabetes Father     Past Surgical History:  Procedure Laterality Date  . ABDOMINAL AORTOGRAM W/LOWER EXTREMITY Bilateral 01/28/2020   Procedure: ABDOMINAL AORTOGRAM W/LOWER EXTREMITY;  Surgeon: Maeola Harman, MD;  Location: [facility] INVASIVE CV LAB;  Service: Cardiovascular;  Laterality: Bilateral;  . AMPUTATION Left 01/23/2020   Procedure: TRANSMETATARSAL AMPUTATION FOOT;  Surgeon: Nadara Mustard, MD;  Location:  [facility] OR;  Service: Orthopedics;  Laterality: Left;  . CATARACT EXTRACTION W/PHACO Left 03/19/2019   Procedure: CATARACT EXTRACTION PHACO AND INTRAOCULAR LENS PLACEMENT (IOC)  LEFT DIABETIC symfony lens;  Surgeon: [place] Crane, MD;  [facility];  Service: Ophthalmology;  Laterality: Left;  Diabetic - oral meds  . CATARACT EXTRACTION W/PHACO Right 04/23/2019   Procedure: CATARACT EXTRACTION PHACO AND INTRAOCULAR LENS PLACEMENT (IOC)  RIGHT DIABETIC SYMFONY TORIC LENS;  Surgeon: [place] Crane, MD;  Location: [facility];  Service: Ophthalmology;  Laterality: Right;  Diabetes-oral med  . ROTATOR CUFF REPAIR  10/2015   Social History   Occupational History  . Not on file  Tobacco Use  . Smoking status: Never Smoker  . Smokeless tobacco: Never Used  Vaping Use  . Vaping Use: Never used  Substance and Sexual Activity  . Alcohol use: Not Currently  . Drug use: Never  . Sexual activity: Not on file

## 2020-03-27 ENCOUNTER — Other Ambulatory Visit: Payer: Self-pay

## 2020-03-27 ENCOUNTER — Encounter: Payer: Self-pay | Admitting: Orthopedic Surgery

## 2020-03-27 ENCOUNTER — Ambulatory Visit (INDEPENDENT_AMBULATORY_CARE_PROVIDER_SITE_OTHER): Payer: BC Managed Care – PPO | Admitting: Physician Assistant

## 2020-03-27 DIAGNOSIS — Z89432 Acquired absence of left foot: Secondary | ICD-10-CM

## 2020-03-27 NOTE — Progress Notes (Signed)
Office Visit Note   Patient: Brent Lam           Date of Birth: 21-Aug-1956           MRN: [id number] Visit Date: 03/27/2020              Requested by: Danella Penton, MD [phone] [facility] [address] [facility] [place],  [place] [address] PCP: Danella Penton, MD  Chief Complaint  Patient presents with  . Left Foot - Follow-up      HPI: This is a 64 year old gentleman who follows up for his left foot transmetatarsal amputation.  He is now 2 months since surgery and has had some slow healing.  He is using a nitroglycerin patch.  Wife is doing daily dressing changes with Silvadene.  He is does say he has been soaking his foot.   Assessment & Plan: Visit Diagnoses: No diagnosis found.  Plan: We will continue to do daily dressing changes only applying Silvadene every other day.  Also advised them not to soak the foot but should just wash it daily with water and Dial soap  Follow-Up Instructions: No follow-ups on file.   Ortho Exam  Patient is alert, oriented, no adenopathy, well-dressed, normal affect, normal respiratory effort. Nitroglycerin patch is in place about 50% of the wound has good healthy vascular granulation tissue.  Along the plantar surface there is some fibrinous tissue with a mild foul odor.  And skin maceration.  Does not probe deeply.  No surrounding cellulitis  Imaging: No results found. No images are attached to the encounter.  Labs: Lab Results  Component Value Date   HGBA1C 7.1 (H) 01/21/2020   REPTSTATUS 01/28/2020 FINAL 01/23/2020   GRAMSTAIN NO WBC SEEN RARE GRAM POSITIVE COCCI  01/23/2020   CULT  01/23/2020    RARE STAPHYLOCOCCUS AUREUS FEW ENTEROCOCCUS FAECALIS NO ANAEROBES ISOLATED Performed at [facility], [address], [place], [place] [address]    [facility] STAPHYLOCOCCUS AUREUS 01/23/2020   LABORGA ENTEROCOCCUS FAECALIS 01/23/2020     Lab Results  Component Value Date   ALBUMIN 3.7 01/21/2020    No results  found for: MG No results found for: VD25OH  No results found for: PREALBUMIN CBC EXTENDED Latest Ref Rng & Units 01/29/2020 01/28/2020 01/26/2020  WBC 4.0 - 10.5 K/uL 11.9(H) 11.3(H) 13.2(H)  RBC 4.22 - 5.81 MIL/uL 4.43 4.36 4.45  HGB 13.0 - 17.0 g/dL 67.2 12.9(L) 13.1  HCT 39 - 52 % 39.9 39.3 40.4  PLT 150 - 400 K/uL 355 318 280  NEUTROABS 1.7 - 7.7 K/uL - - -  LYMPHSABS 0.7 - 4.0 K/uL - - -     There is no height or weight on file to calculate BMI.  Orders:  No orders of the defined types were placed in this encounter.  No orders of the defined types were placed in this encounter.    Procedures: No procedures performed  Clinical Data: No additional findings.  ROS:  All other systems negative, except as noted in the HPI. Review of Systems  Objective: Vital Signs: There were no vitals taken for this visit.  Specialty Comments:  No specialty comments available.  PMFS History: Patient Active Problem List   Diagnosis Date Noted  . Gangrene of toe of left foot (HCC)   . PVD (peripheral vascular disease) (HCC)   . CKD (chronic kidney disease) stage 3, GFR 30-59 ml/min 01/21/2020  . HTN (hypertension) 01/21/2020  . DM2 (diabetes mellitus,  type 2) ([place]) 01/21/2020  . Leukocytosis 01/21/2020  . Sepsis ([place]) 01/21/2020  . Osteomyelitis of toe of left foot ([place]) 01/21/2020  . Osteoarthritis of toe joint, left 01/21/2020   Past Medical History:  Diagnosis Date  . Diabetes mellitus, type 2 (HCC)    diet controlled  . History of kidney stones    X2 4or 5 yrs ago  . Hypertension    controlled on meds    Family History  Problem Relation Age of Onset  . Diabetes Mother   . Diabetes Father     Past Surgical History:  Procedure Laterality Date  . ABDOMINAL AORTOGRAM W/LOWER EXTREMITY Bilateral 01/28/2020   Procedure: ABDOMINAL AORTOGRAM W/LOWER EXTREMITY;  Surgeon: Waynetta Sandy, MD;  Location: [place] [facility];  Service: Cardiovascular;  Laterality:  Bilateral;  . AMPUTATION Left 01/23/2020   Procedure: TRANSMETATARSAL AMPUTATION FOOT;  Surgeon: Newt Minion, MD;  Location: [place];  Service: Orthopedics;  Laterality: Left;  . CATARACT EXTRACTION W/PHACO Left 03/19/2019   Procedure: CATARACT EXTRACTION PHACO AND INTRAOCULAR LENS PLACEMENT (IOC)  LEFT DIABETIC symfony lens;  Surgeon: Eulogio Bear, MD;  Location: [place];  Service: Ophthalmology;  Laterality: Left;  Diabetic - oral meds  . CATARACT EXTRACTION W/PHACO Right 04/23/2019   Procedure: CATARACT EXTRACTION PHACO AND INTRAOCULAR LENS PLACEMENT ([place])  RIGHT DIABETIC SYMFONY TORIC LENS;  Surgeon: Eulogio Bear, MD;  Location: [place];  Service: Ophthalmology;  Laterality: Right;  Diabetes-oral med  . ROTATOR CUFF REPAIR  10/2015   Social History   Occupational History  . Not on file  Tobacco Use  . Smoking status: Never Smoker  . Smokeless tobacco: Never Used  Vaping Use  . Vaping Use: Never used  Substance and Sexual Activity  . Alcohol use: Not Currently  . Drug use: Never  . Sexual activity: Not on file

## 2020-04-10 ENCOUNTER — Other Ambulatory Visit: Payer: Self-pay

## 2020-04-10 ENCOUNTER — Encounter: Payer: Self-pay | Admitting: Orthopedic Surgery

## 2020-04-10 ENCOUNTER — Ambulatory Visit (INDEPENDENT_AMBULATORY_CARE_PROVIDER_SITE_OTHER): Payer: BC Managed Care – PPO | Admitting: Orthopedic Surgery

## 2020-04-10 VITALS — Ht 72.0 in | Wt 210.0 lb

## 2020-04-10 DIAGNOSIS — Z89432 Acquired absence of left foot: Secondary | ICD-10-CM

## 2020-04-11 ENCOUNTER — Encounter: Payer: Self-pay | Admitting: Orthopedic Surgery

## 2020-04-11 NOTE — Progress Notes (Signed)
Office Visit Note   Patient: Brent Lam           Date of Birth: 03/10/1956           MRN: [id number] Visit Date: 04/10/2020              Requested by: Danella Penton, MD [phone] [facility] [address] [facility] [place],  [place] [address] PCP: Danella Penton, MD  Chief Complaint  Patient presents with   Left Foot - Routine Post Op    01/23/20 Left TMA      HPI: Patient is a 64 year old gentleman who presents status post left transmetatarsal amputation.  Patient has been using a nitroglycerin patch and Trental  for assistance in wound healing.  Assessment & Plan: Visit Diagnoses:  1. History of transmetatarsal amputation of left foot (HCC)     Plan: Continue with current care reevaluate in 2 weeks.  Follow-Up Instructions: Return in about 2 weeks (around 04/24/2020).   Ortho Exam  Patient is alert, oriented, no adenopathy, well-dressed, normal affect, normal respiratory effort. Examination of the open wound is decreasing in size patient has no pain minimal swelling there is a wound that is 2 cm in diameter with good granulation tissue.  Imaging: No results found. No images are attached to the encounter.  Labs: Lab Results  Component Value Date   HGBA1C 7.1 (H) 01/21/2020   REPTSTATUS 01/28/2020 FINAL 01/23/2020   GRAMSTAIN NO WBC SEEN RARE GRAM POSITIVE COCCI  01/23/2020   CULT  01/23/2020    RARE STAPHYLOCOCCUS AUREUS FEW ENTEROCOCCUS FAECALIS NO ANAEROBES ISOLATED Performed at [facility], [address] [place], [place] [address]    [facility] STAPHYLOCOCCUS AUREUS 01/23/2020   LABORGA ENTEROCOCCUS FAECALIS 01/23/2020     Lab Results  Component Value Date   ALBUMIN 3.7 01/21/2020    No results found for: MG No results found for: VD25OH  No results found for: PREALBUMIN CBC EXTENDED Latest Ref Rng & Units 01/29/2020 01/28/2020 01/26/2020  WBC 4.0 - 10.5 K/uL 11.9(H) 11.3(H) 13.2(H)  RBC 4.22 - 5.81 MIL/uL 4.43 4.36 4.45  HGB 13.0  - 17.0 g/dL 99.3 12.9(L) 13.1  HCT 39 - 52 % 39.9 39.3 40.4  PLT 150 - 400 K/uL 355 318 280  NEUTROABS 1.7 - 7.7 K/uL - - -  LYMPHSABS 0.7 - 4.0 K/uL - - -     Body mass index is 28.48 kg/m.  Orders:  No orders of the defined types were placed in this encounter.  No orders of the defined types were placed in this encounter.    Procedures: No procedures performed  Clinical Data: No additional findings.  ROS:  All other systems negative, except as noted in the HPI. Review of Systems  Objective: Vital Signs: Ht 6' (1.829 m)    Wt 210 lb (95.3 kg)    BMI 28.48 kg/m   Specialty Comments:  No specialty comments available.  PMFS History: Patient Active Problem List   Diagnosis Date Noted   Gangrene of toe of left foot (HCC)    PVD (peripheral vascular disease) (HCC)    CKD (chronic kidney disease) stage 3, GFR 30-59 ml/min 01/21/2020   HTN (hypertension) 01/21/2020   DM2 (diabetes mellitus, type 2) (HCC) 01/21/2020   Leukocytosis 01/21/2020   Sepsis (HCC) 01/21/2020   Osteomyelitis of toe of left foot (HCC) 01/21/2020   Osteoarthritis of toe joint, left 01/21/2020   Past Medical History:  Diagnosis Date   Diabetes  mellitus, type 2 (HCC)    diet controlled   History of kidney stones    X2 4or 5 yrs ago   Hypertension    controlled on meds    Family History  Problem Relation Age of Onset   Diabetes Mother    Diabetes Father     Past Surgical History:  Procedure Laterality Date   ABDOMINAL AORTOGRAM W/LOWER EXTREMITY Bilateral 01/28/2020   Procedure: ABDOMINAL AORTOGRAM W/LOWER EXTREMITY;  Surgeon: Maeola Harman, MD;  Location: [facility] INVASIVE CV LAB;  Service: Cardiovascular;  Laterality: Bilateral;   AMPUTATION Left 01/23/2020   Procedure: TRANSMETATARSAL AMPUTATION FOOT;  Surgeon: Nadara Mustard, MD;  Location: [facility] OR;  Service: Orthopedics;  Laterality: Left;   CATARACT EXTRACTION W/PHACO Left 03/19/2019   Procedure: CATARACT  EXTRACTION PHACO AND INTRAOCULAR LENS PLACEMENT (IOC)  LEFT DIABETIC symfony lens;  Surgeon: [place] Crane, MD;  Location: [facility];  Service: Ophthalmology;  Laterality: Left;  Diabetic - oral meds   CATARACT EXTRACTION W/PHACO Right 04/23/2019   Procedure: CATARACT EXTRACTION PHACO AND INTRAOCULAR LENS PLACEMENT (IOC)  RIGHT DIABETIC SYMFONY TORIC LENS;  Surgeon: Nevada Crane, MD;  Location: [facility];  Service: Ophthalmology;  Laterality: Right;  Diabetes-oral med   ROTATOR CUFF REPAIR  10/2015   Social History   Occupational History   Not on file  Tobacco Use   Smoking status: Never Smoker   Smokeless tobacco: Never Used  Vaping Use   Vaping Use: Never used  Substance and Sexual Activity   Alcohol use: Not Currently   Drug use: Never   Sexual activity: Not on file

## 2020-04-24 ENCOUNTER — Encounter: Payer: Self-pay | Admitting: Orthopedic Surgery

## 2020-04-24 ENCOUNTER — Other Ambulatory Visit: Payer: Self-pay

## 2020-04-24 ENCOUNTER — Ambulatory Visit (INDEPENDENT_AMBULATORY_CARE_PROVIDER_SITE_OTHER): Payer: BC Managed Care – PPO | Admitting: Physician Assistant

## 2020-04-24 VITALS — Ht 72.0 in | Wt 210.0 lb

## 2020-04-24 DIAGNOSIS — M869 Osteomyelitis, unspecified: Secondary | ICD-10-CM

## 2020-04-24 NOTE — Progress Notes (Signed)
Office Visit Note   Patient: Brent Lam           Date of Birth: 22-Sep-1956           MRN: [id number] Visit Date: 04/24/2020              Requested by: Danella Penton, MD [phone] [facility] [address] [facility] [place],  [place] [address] PCP: Danella Penton, MD  Chief Complaint  Patient presents with  . Left Foot - Routine Post Op    01/23/20 Left TMA        HPI: Patient is 3 months status post left transmetatarsal amputation.  He is doing well.  He is using Trental and a nitroglycerin patch.  He and his wife feel that this continues to start healing more efficiently.  He has ordered his custom shoe which will be arriving in about 3 weeks  Assessment & Plan: Visit Diagnoses: No diagnosis found.  Plan: Continue with current dressing change schedule follow-up in 2 to 3 weeks  Follow-Up Instructions: No follow-ups on file.   Ortho Exam  Patient is alert, oriented, no adenopathy, well-dressed, normal affect, normal respiratory effort. Focused examination of his foot demonstrates healing surgical incision there is good bleeding vascular tissue in the 1 area of dehiscence that is still healing.  No cellulitis no fluctuance no sign of infection  Imaging: No results found. No images are attached to the encounter.  Labs: Lab Results  Component Value Date   HGBA1C 7.1 (H) 01/21/2020   REPTSTATUS 01/28/2020 FINAL 01/23/2020   GRAMSTAIN NO WBC SEEN RARE GRAM POSITIVE COCCI  01/23/2020   CULT  01/23/2020    RARE STAPHYLOCOCCUS AUREUS FEW ENTEROCOCCUS FAECALIS NO ANAEROBES ISOLATED Performed at [facility], [address] [place], [place] [address]    [facility] STAPHYLOCOCCUS AUREUS 01/23/2020   LABORGA ENTEROCOCCUS FAECALIS 01/23/2020     Lab Results  Component Value Date   ALBUMIN 3.7 01/21/2020    No results found for: MG No results found for: VD25OH  No results found for: PREALBUMIN CBC EXTENDED Latest Ref Rng & Units 01/29/2020 01/28/2020  01/26/2020  WBC 4.0 - 10.5 K/uL 11.9(H) 11.3(H) 13.2(H)  RBC 4.22 - 5.81 MIL/uL 4.43 4.36 4.45  HGB 13.0 - 17.0 g/dL 95.0 12.9(L) 13.1  HCT 39 - 52 % 39.9 39.3 40.4  PLT 150 - 400 K/uL 355 318 280  NEUTROABS 1.7 - 7.7 K/uL - - -  LYMPHSABS 0.7 - 4.0 K/uL - - -     Body mass index is 28.48 kg/m.  Orders:  No orders of the defined types were placed in this encounter.  No orders of the defined types were placed in this encounter.    Procedures: No procedures performed  Clinical Data: No additional findings.  ROS:  All other systems negative, except as noted in the HPI. Review of Systems  Objective: Vital Signs: Ht 6' (1.829 m)   Wt 210 lb (95.3 kg)   BMI 28.48 kg/m   Specialty Comments:  No specialty comments available.  PMFS History: Patient Active Problem List   Diagnosis Date Noted  . Gangrene of toe of left foot (HCC)   . PVD (peripheral vascular disease) (HCC)   . CKD (chronic kidney disease) stage 3, GFR 30-59 ml/min 01/21/2020  . HTN (hypertension) 01/21/2020  . DM2 (diabetes mellitus, type 2) (HCC) 01/21/2020  . Leukocytosis 01/21/2020  . Sepsis (HCC) 01/21/2020  . Osteomyelitis of toe of left foot (HCC)  01/21/2020  . Osteoarthritis of toe joint, left 01/21/2020   Past Medical History:  Diagnosis Date  . Diabetes mellitus, type 2 (HCC)    diet controlled  . History of kidney stones    X2 4or 5 yrs ago  . Hypertension    controlled on meds    Family History  Problem Relation Age of Onset  . Diabetes Mother   . Diabetes Father     Past Surgical History:  Procedure Laterality Date  . ABDOMINAL AORTOGRAM W/LOWER EXTREMITY Bilateral 01/28/2020   Procedure: ABDOMINAL AORTOGRAM W/LOWER EXTREMITY;  Surgeon: Maeola Harman, MD;  Location: [facility] INVASIVE CV LAB;  Service: Cardiovascular;  Laterality: Bilateral;  . AMPUTATION Left 01/23/2020   Procedure: TRANSMETATARSAL AMPUTATION FOOT;  Surgeon: Nadara Mustard, MD;  Location: [facility] OR;  Service:  Orthopedics;  Laterality: Left;  . CATARACT EXTRACTION W/PHACO Left 03/19/2019   Procedure: CATARACT EXTRACTION PHACO AND INTRAOCULAR LENS PLACEMENT (IOC)  LEFT DIABETIC symfony lens;  Surgeon: Nevada Crane, MD;  Location: [facility];  Service: Ophthalmology;  Laterality: Left;  Diabetic - oral meds  . CATARACT EXTRACTION W/PHACO Right 04/23/2019   Procedure: CATARACT EXTRACTION PHACO AND INTRAOCULAR LENS PLACEMENT (IOC)  RIGHT DIABETIC SYMFONY TORIC LENS;  Surgeon: Nevada Crane, MD;  Location: [facility];  Service: Ophthalmology;  Laterality: Right;  Diabetes-oral med  . ROTATOR CUFF REPAIR  10/2015   Social History   Occupational History  . Not on file  Tobacco Use  . Smoking status: Never Smoker  . Smokeless tobacco: Never Used  Vaping Use  . Vaping Use: Never used  Substance and Sexual Activity  . Alcohol use: Not Currently  . Drug use: Never  . Sexual activity: Not on file

## 2020-05-08 ENCOUNTER — Other Ambulatory Visit: Payer: Self-pay

## 2020-05-08 ENCOUNTER — Encounter: Payer: Self-pay | Admitting: Physician Assistant

## 2020-05-08 ENCOUNTER — Ambulatory Visit (INDEPENDENT_AMBULATORY_CARE_PROVIDER_SITE_OTHER): Payer: BC Managed Care – PPO | Admitting: Physician Assistant

## 2020-05-08 VITALS — Ht 72.0 in | Wt 210.0 lb

## 2020-05-08 DIAGNOSIS — M869 Osteomyelitis, unspecified: Secondary | ICD-10-CM

## 2020-05-08 NOTE — Progress Notes (Signed)
Office Visit Note   Patient: Brent Lam           Date of Birth: May 03, 1956           MRN: [id number] Visit Date: 05/08/2020              Requested by: Danella Penton, MD [phone] [facility] [address] [facility] [place],  [place] [address] PCP: Danella Penton, MD  Chief Complaint  Patient presents with  . Left Foot - Follow-up    01/23/20 left transmet amputation       HPI: Patient is 3-1/2 months status post left transmetatarsal amputation.  He has been slow to heal the wound and just one very small area.  He has been using a nitroglycerin patch as well as silver nitrate every other day.  He has seen some significant improvement.  He has ordered his shoes and is to pick them up today  Assessment & Plan: Visit Diagnoses: No diagnosis found.  Plan: Continue with daily dressing changes. wil begin using Prisma  To help dry out the small area.  We will follow up in 2 weeks.  Follow-Up Instructions: No follow-ups on file.   Ortho Exam  Patient is alert, oriented, no adenopathy, well-dressed, normal affect, normal respiratory effort. Focused examination of his left transmetatarsal amputation stump demonstrates no cellulitis the wound is 90% healed.  There is 1 small area of superficial dehiscence that is granulating in quite nicely.  It is approximately 1 cm by half a centimeter there is no foul odor and minimal drainage no surrounding cellulitis no concerns at this point for any infective process  Imaging: No results found. No images are attached to the encounter.  Labs: Lab Results  Component Value Date   HGBA1C 7.1 (H) 01/21/2020   REPTSTATUS 01/28/2020 FINAL 01/23/2020   GRAMSTAIN NO WBC SEEN RARE GRAM POSITIVE COCCI  01/23/2020   CULT  01/23/2020    RARE STAPHYLOCOCCUS AUREUS FEW ENTEROCOCCUS FAECALIS NO ANAEROBES ISOLATED Performed at [facility], [address] [place], [place] [address]    [facility] STAPHYLOCOCCUS AUREUS 01/23/2020   LABORGA  ENTEROCOCCUS FAECALIS 01/23/2020     Lab Results  Component Value Date   ALBUMIN 3.7 01/21/2020    No results found for: MG No results found for: VD25OH  No results found for: PREALBUMIN CBC EXTENDED Latest Ref Rng & Units 01/29/2020 01/28/2020 01/26/2020  WBC 4.0 - 10.5 K/uL 11.9(H) 11.3(H) 13.2(H)  RBC 4.22 - 5.81 MIL/uL 4.43 4.36 4.45  HGB 13.0 - 17.0 g/dL 68.3 12.9(L) 13.1  HCT 39 - 52 % 39.9 39.3 40.4  PLT 150 - 400 K/uL 355 318 280  NEUTROABS 1.7 - 7.7 K/uL - - -  LYMPHSABS 0.7 - 4.0 K/uL - - -     Body mass index is 28.48 kg/m.  Orders:  No orders of the defined types were placed in this encounter.  No orders of the defined types were placed in this encounter.    Procedures: No procedures performed  Clinical Data: No additional findings.  ROS:  All other systems negative, except as noted in the HPI. Review of Systems  Objective: Vital Signs: Ht 6' (1.829 m)   Wt 210 lb (95.3 kg)   BMI 28.48 kg/m   Specialty Comments:  No specialty comments available.  PMFS History: Patient Active Problem List   Diagnosis Date Noted  . Gangrene of toe of left foot (HCC)   . PVD (peripheral vascular disease) (  HCC)   . CKD (chronic kidney disease) stage 3, GFR 30-59 ml/min 01/21/2020  . HTN (hypertension) 01/21/2020  . DM2 (diabetes mellitus, type 2) (HCC) 01/21/2020  . Leukocytosis 01/21/2020  . Sepsis (HCC) 01/21/2020  . Osteomyelitis of toe of left foot (HCC) 01/21/2020  . Osteoarthritis of toe joint, left 01/21/2020   Past Medical History:  Diagnosis Date  . Diabetes mellitus, type 2 (HCC)    diet controlled  . History of kidney stones    X2 4or 5 yrs ago  . Hypertension    controlled on meds    Family History  Problem Relation Age of Onset  . Diabetes Mother   . Diabetes Father     Past Surgical History:  Procedure Laterality Date  . ABDOMINAL AORTOGRAM W/LOWER EXTREMITY Bilateral 01/28/2020   Procedure: ABDOMINAL AORTOGRAM W/LOWER EXTREMITY;   Surgeon: Maeola Harman, MD;  Location: [facility] INVASIVE CV LAB;  Service: Cardiovascular;  Laterality: Bilateral;  . AMPUTATION Left 01/23/2020   Procedure: TRANSMETATARSAL AMPUTATION FOOT;  Surgeon: Nadara Mustard, MD;  Location: [facility] OR;  Service: Orthopedics;  Laterality: Left;  . CATARACT EXTRACTION W/PHACO Left 03/19/2019   Procedure: CATARACT EXTRACTION PHACO AND INTRAOCULAR LENS PLACEMENT (IOC)  LEFT DIABETIC symfony lens;  Surgeon: Nevada Crane, MD;  Location: [facility];  Service: Ophthalmology;  Laterality: Left;  Diabetic - oral meds  . CATARACT EXTRACTION W/PHACO Right 04/23/2019   Procedure: CATARACT EXTRACTION PHACO AND INTRAOCULAR LENS PLACEMENT (IOC)  RIGHT DIABETIC SYMFONY TORIC LENS;  Surgeon: Nevada Crane, MD;  Location: [facility];  Service: Ophthalmology;  Laterality: Right;  Diabetes-oral med  . ROTATOR CUFF REPAIR  10/2015   Social History   Occupational History  . Not on file  Tobacco Use  . Smoking status: Never Smoker  . Smokeless tobacco: Never Used  Vaping Use  . Vaping Use: Never used  Substance and Sexual Activity  . Alcohol use: Not Currently  . Drug use: Never  . Sexual activity: Not on file

## 2020-05-19 ENCOUNTER — Ambulatory Visit
Admission: EM | Admit: 2020-05-19 | Discharge: 2020-05-19 | Disposition: A | Discharge status: Home / Self Care | Payer: BC Managed Care – PPO | Attending: Emergency Medicine | Admitting: Emergency Medicine

## 2020-05-19 ENCOUNTER — Other Ambulatory Visit: Payer: Self-pay

## 2020-05-19 DIAGNOSIS — Z87442 Personal history of urinary calculi: Secondary | ICD-10-CM

## 2020-05-19 DIAGNOSIS — R319 Hematuria, unspecified: Secondary | ICD-10-CM

## 2020-05-19 DIAGNOSIS — R109 Unspecified abdominal pain: Secondary | ICD-10-CM

## 2020-05-19 LAB — POCT URINALYSIS DIP (MANUAL ENTRY)
Bilirubin, UA: NEGATIVE
Glucose, UA: 100 mg/dL — AB
Leukocytes, UA: NEGATIVE
Nitrite, UA: NEGATIVE
Protein Ur, POC: 30 mg/dL — AB
Spec Grav, UA: 1.025 (ref 1.010–1.025)
Urobilinogen, UA: 0.2 E.U./dL
pH, UA: 5.5 (ref 5.0–8.0)

## 2020-05-19 MED ORDER — TRAMADOL HCL 50 MG PO TABS: 50.0000 mg | ORAL_TABLET | Freq: Two times a day (BID) | ORAL | 0 refills | Status: DC | PRN

## 2020-05-19 MED ORDER — KETOROLAC TROMETHAMINE 60 MG/2ML IM SOLN
60.0000 mg | Freq: Once | INTRAMUSCULAR | Status: AC
  Administered 2020-05-19: 60 mg via INTRAMUSCULAR

## 2020-05-19 MED ORDER — ONDANSETRON HCL 4 MG PO TABS
4.0000 mg | ORAL_TABLET | Freq: Four times a day (QID) | ORAL | 0 refills | Status: DC
Start: 2020-05-19 — End: 2023-03-18

## 2020-05-19 NOTE — ED Triage Notes (Signed)
Pt presents with left side flank pain and lower left abdominal pain that began yesterday , pt has h/o kidney stones

## 2020-05-19 NOTE — ED Provider Notes (Signed)
[facility]   CC: LT flank pain  SUBJECTIVE:  Brent Lam is a 64 y.o. male who complains of LT sided flank and LLQ discomfort x 1 day.  Patient denies a precipitating event, trauma or heavy lifting.  Localizes the pain to the LT flank and LLQ.  Pain is constant and describes it as sharp.  Has tried OTC medications without relief.  Denies aggravating factors.  Admits to similar symptoms in the past with kidney stones.  Complains of associated nausea, and vomiting.  Denies fever, chills, abdominal pain, diarrhea, constipation, melena, hematochezia, testicular swelling, dysuria, urinary frequency, urinary urgency.    ROS: As in HPI.  All other pertinent ROS negative.     Past Medical History:  Diagnosis Date  . Diabetes mellitus, type 2 (HCC)    diet controlled  . History of kidney stones    X2 4or 5 yrs ago  . Hypertension    controlled on meds   Past Surgical History:  Procedure Laterality Date  . ABDOMINAL AORTOGRAM W/LOWER EXTREMITY Bilateral 01/28/2020   Procedure: ABDOMINAL AORTOGRAM W/LOWER EXTREMITY;  Surgeon: Maeola Harman, MD;  Location: [facility] INVASIVE CV LAB;  Service: Cardiovascular;  Laterality: Bilateral;  . AMPUTATION Left 01/23/2020   Procedure: TRANSMETATARSAL AMPUTATION FOOT;  Surgeon: Nadara Mustard, MD;  Location: [facility] OR;  Service: Orthopedics;  Laterality: Left;  . CATARACT EXTRACTION W/PHACO Left 03/19/2019   Procedure: CATARACT EXTRACTION PHACO AND INTRAOCULAR LENS PLACEMENT (IOC)  LEFT DIABETIC symfony lens;  Surgeon: Nevada Crane, MD;  Location: [facility];  Service: Ophthalmology;  Laterality: Left;  Diabetic - oral meds  . CATARACT EXTRACTION W/PHACO Right 04/23/2019   Procedure: CATARACT EXTRACTION PHACO AND INTRAOCULAR LENS PLACEMENT (IOC)  RIGHT DIABETIC SYMFONY TORIC LENS;  Surgeon: Nevada Crane, MD;  Location: [facility];  Service: Ophthalmology;  Laterality: Right;  Diabetes-oral med  . ROTATOR CUFF REPAIR   10/2015   No Known Allergies No current facility-administered medications on file prior to encounter.   Current Outpatient Medications on File Prior to Encounter  Medication Sig Dispense Refill  . acetaminophen (TYLENOL) 500 MG tablet Take 1,000 mg by mouth every 6 (six) hours as needed for moderate pain.    Marland Kitchen ALPRAZolam (XANAX) 0.5 MG tablet Take 1 tablet (0.5 mg total) by mouth 3 (three) times daily as needed for anxiety. 30 tablet 0  . Cyanocobalamin (VITAMIN B-12 SL) Place under the tongue once a week.     . metFORMIN (GLUCOPHAGE) 500 MG tablet Take 500 mg by mouth 2 (two) times daily with a meal.    . metoprolol tartrate (LOPRESSOR) 50 MG tablet Take 50 mg by mouth daily. am    . nitroGLYCERIN (NITRODUR - DOSED IN MG/24 HR) 0.2 mg/hr patch Place 1 patch (0.2 mg total) onto the skin daily. 30 patch 12  . pentoxifylline (TRENTAL) 400 MG CR tablet TAKE 1 TABLET (400 MG TOTAL) BY MOUTH 3 (THREE) TIMES DAILY WITH MEALS. 270 tablet 1  . silver sulfADIAZINE (SILVADENE) 1 % cream Apply 1 application topically daily. Apply to affected area daily plus dry dressing 400 g 3   Social History   Socioeconomic History  . Marital status: Legally Separated    Spouse name: Not on file  . Number of children: Not on file  . Years of education: Not on file  . Highest education level: Not on file  Occupational History  . Not on file  Tobacco Use  . Smoking status: Never Smoker  .  Smokeless tobacco: Never Used  Vaping Use  . Vaping Use: Never used  Substance and Sexual Activity  . Alcohol use: Not Currently  . Drug use: Never  . Sexual activity: Not on file  Other Topics Concern  . Not on file  Social History Narrative  . Not on file   Social Determinants of Health   Financial Resource Strain:   . Difficulty of Paying Living Expenses:   Food Insecurity:   . Worried About Programme researcher, broadcasting/film/video in the Last Year:   . Barista in the Last Year:   Transportation Needs:   . Automotive engineer (Medical):   Marland Kitchen Lack of Transportation (Non-Medical):   Physical Activity:   . Days of Exercise per Week:   . Minutes of Exercise per Session:   Stress:   . Feeling of Stress :   Social Connections:   . Frequency of Communication with Friends and Family:   . Frequency of Social Gatherings with Friends and Family:   . Attends Religious Services:   . Active Member of Clubs or Organizations:   . Attends Banker Meetings:   Marland Kitchen Marital Status:   Intimate Partner Violence:   . Fear of Current or Ex-Partner:   . Emotionally Abused:   Marland Kitchen Physically Abused:   . Sexually Abused:    Family History  Problem Relation Age of Onset  . Diabetes Mother   . Diabetes Father     OBJECTIVE:  Vitals:   05/19/20 1705  BP: 130/81  Pulse: (!) 109  Resp: 20  Temp: 98.5 F (36.9 C)  SpO2: 94%   General appearance: Alert in no acute distress HEENT: NCAT.  Oropharynx clear.  Lungs: clear to auscultation bilaterally without adventitious breath sounds Heart: regular rate and rhythm.   Abdomen: soft; non-distended; no tenderness; bowel sounds present; no guarding Back: no CVA tenderness Extremities: no edema; symmetrical with no gross deformities Skin: warm and dry Neurologic: Ambulates from chair to exam table without difficulty Psychological: alert and cooperative; normal mood and affect  Labs Reviewed  POCT URINALYSIS DIP (MANUAL ENTRY) - Abnormal; Notable for the following components:      Result Value   Glucose, UA =100 (*)    Ketones, POC UA trace (5) (*)    Blood, UA trace-intact (*)    Protein Ur, POC =30 (*)    All other components within normal limits  URINE CULTURE    ASSESSMENT & PLAN:  1. Flank pain   2. Hematuria, unspecified type   3. History of kidney stones     Meds ordered this encounter  Medications  . traMADol (ULTRAM) 50 MG tablet    Sig: Take 1 tablet (50 mg total) by mouth every 12 (twelve) hours as needed.    Dispense:  15 tablet      Refill:  0    Order Specific Question:   Supervising Provider    Answer:   Eustace Moore [[id number]]  . ketorolac (TORADOL) injection 60 mg    Drink plenty of fluids and get rest Use OTC ibuprofen as needed for pain.   Tramadol prescribed.  Use as needed for severe break-through pain.  DO NOT TAKE PRIOR TO driving or operating heavy machinery Follow up with PCP if symptoms persist Return or go to ER if you have any new or worsening symptoms (difficulty urinating, blood in urine, pain that does not moderate with medication, fever, chills, abdominal pain, etc...)   Outlined  signs and symptoms indicating need for more acute intervention. Patient verbalized understanding. After Visit Summary given.     Rennis Harding, PA-C 05/19/20 1740

## 2020-05-19 NOTE — Discharge Instructions (Addendum)
Drink plenty of fluids and get rest Use OTC ibuprofen as needed for pain.   Tramadol prescribed.  Use as needed for severe break-through pain.  DO NOT TAKE PRIOR TO driving or operating heavy machinery Follow up with PCP if symptoms persist Return or go to ER if you have any new or worsening symptoms (difficulty urinating, blood in urine, pain that does not moderate with medication, fever, chills, abdominal pain, etc...)

## 2020-05-21 ENCOUNTER — Telehealth (HOSPITAL_COMMUNITY): Payer: Self-pay | Admitting: Emergency Medicine

## 2020-05-21 LAB — URINE CULTURE: Culture: 50000 — AB

## 2020-05-21 MED ORDER — SULFAMETHOXAZOLE-TRIMETHOPRIM 800-160 MG PO TABS
1.0000 | ORAL_TABLET | Freq: Two times a day (BID) | ORAL | 0 refills | Status: AC
Start: 2020-05-21 — End: 2020-05-24

## 2020-05-22 ENCOUNTER — Encounter: Payer: Self-pay | Admitting: Orthopedic Surgery

## 2020-05-22 ENCOUNTER — Ambulatory Visit (INDEPENDENT_AMBULATORY_CARE_PROVIDER_SITE_OTHER): Payer: BC Managed Care – PPO | Admitting: Physician Assistant

## 2020-05-22 VITALS — Ht 72.0 in | Wt 210.0 lb

## 2020-05-22 DIAGNOSIS — M869 Osteomyelitis, unspecified: Secondary | ICD-10-CM

## 2020-05-22 NOTE — Progress Notes (Signed)
Office Visit Note   Patient: Brent Lam           Date of Birth: 1956-05-31           MRN: [id number] Visit Date: 05/22/2020              Requested by: Danella Penton, MD [phone] [facility] [address] [facility] [place],  [place] [address] PCP: Danella Penton, MD  Chief Complaint  Patient presents with  . Left Foot - Follow-up    01/23/20 transmet amputation       HPI: This is a pleasant gentleman who is status post left foot transmetatarsal amputation.  He has been slow to heal however has had significant improvement since beginning a nitro patch he is also using some silver cell daily.  His wife is doing his dressing change  Assessment & Plan: Visit Diagnoses: No diagnosis found.  Plan: He will follow up in 1 month.  Hopefully by that time he can wear his shoes.  Follow-Up Instructions: No follow-ups on file.   Ortho Exam  Patient is alert, oriented, no adenopathy, well-dressed, normal affect, normal respiratory effort. Focused examination demonstrates overall well-healed surgical incision there is a just 1 mm x 1 mm shallow area that has scant serous drainage.  There is no foul odor no surrounding cellulitis does not probe deeply.  Swelling is well controlled no obvious evidence of infection  Imaging: No results found. No images are attached to the encounter.  Labs: Lab Results  Component Value Date   HGBA1C 7.1 (H) 01/21/2020   REPTSTATUS 05/21/2020 FINAL 05/19/2020   GRAMSTAIN NO WBC SEEN RARE GRAM POSITIVE COCCI  01/23/2020   CULT 50,000 COLONIES/mL STAPHYLOCOCCUS HAEMOLYTICUS (A) 05/19/2020   LABORGA STAPHYLOCOCCUS HAEMOLYTICUS (A) 05/19/2020     Lab Results  Component Value Date   ALBUMIN 3.7 01/21/2020    No results found for: MG No results found for: VD25OH  No results found for: PREALBUMIN CBC EXTENDED Latest Ref Rng & Units 01/29/2020 01/28/2020 01/26/2020  WBC 4.0 - 10.5 K/uL 11.9(H) 11.3(H) 13.2(H)  RBC 4.22 - 5.81 MIL/uL 4.43 4.36  4.45  HGB 13.0 - 17.0 g/dL 67.1 12.9(L) 13.1  HCT 39 - 52 % 39.9 39.3 40.4  PLT 150 - 400 K/uL 355 318 280  NEUTROABS 1.7 - 7.7 K/uL - - -  LYMPHSABS 0.7 - 4.0 K/uL - - -     Body mass index is 28.48 kg/m.  Orders:  No orders of the defined types were placed in this encounter.  No orders of the defined types were placed in this encounter.    Procedures: No procedures performed  Clinical Data: No additional findings.  ROS:  All other systems negative, except as noted in the HPI. Review of Systems  Objective: Vital Signs: Ht 6' (1.829 m)   Wt 210 lb (95.3 kg)   BMI 28.48 kg/m   Specialty Comments:  No specialty comments available.  PMFS History: Patient Active Problem List   Diagnosis Date Noted  . Gangrene of toe of left foot (HCC)   . PVD (peripheral vascular disease) (HCC)   . CKD (chronic kidney disease) stage 3, GFR 30-59 ml/min 01/21/2020  . HTN (hypertension) 01/21/2020  . DM2 (diabetes mellitus, type 2) (HCC) 01/21/2020  . Leukocytosis 01/21/2020  . Sepsis (HCC) 01/21/2020  . Osteomyelitis of toe of left foot (HCC) 01/21/2020  . Osteoarthritis of toe joint, left 01/21/2020   Past Medical History:  Diagnosis Date  .  Diabetes mellitus, type 2 (HCC)    diet controlled  . History of kidney stones    X2 4or 5 yrs ago  . Hypertension    controlled on meds    Family History  Problem Relation Age of Onset  . Diabetes Mother   . Diabetes Father     Past Surgical History:  Procedure Laterality Date  . ABDOMINAL AORTOGRAM W/LOWER EXTREMITY Bilateral 01/28/2020   Procedure: ABDOMINAL AORTOGRAM W/LOWER EXTREMITY;  Surgeon: Maeola Harman, MD;  Location: [facility] INVASIVE CV LAB;  Service: Cardiovascular;  Laterality: Bilateral;  . AMPUTATION Left 01/23/2020   Procedure: TRANSMETATARSAL AMPUTATION FOOT;  Surgeon: Nadara Mustard, MD;  Location: [facility] OR;  Service: Orthopedics;  Laterality: Left;  . CATARACT EXTRACTION W/PHACO Left 03/19/2019   Procedure:  CATARACT EXTRACTION PHACO AND INTRAOCULAR LENS PLACEMENT (IOC)  LEFT DIABETIC symfony lens;  Surgeon: Nevada Crane, MD;  Location: [facility];  Service: Ophthalmology;  Laterality: Left;  Diabetic - oral meds  . CATARACT EXTRACTION W/PHACO Right 04/23/2019   Procedure: CATARACT EXTRACTION PHACO AND INTRAOCULAR LENS PLACEMENT (IOC)  RIGHT DIABETIC SYMFONY TORIC LENS;  Surgeon: Nevada Crane, MD;  Location: [facility];  Service: Ophthalmology;  Laterality: Right;  Diabetes-oral med  . ROTATOR CUFF REPAIR  10/2015   Social History   Occupational History  . Not on file  Tobacco Use  . Smoking status: Never Smoker  . Smokeless tobacco: Never Used  Vaping Use  . Vaping Use: Never used  Substance and Sexual Activity  . Alcohol use: Not Currently  . Drug use: Never  . Sexual activity: Not on file

## 2020-06-19 ENCOUNTER — Ambulatory Visit: Payer: BC Managed Care – PPO

## 2020-06-19 ENCOUNTER — Other Ambulatory Visit: Payer: Self-pay

## 2020-06-19 ENCOUNTER — Ambulatory Visit (INDEPENDENT_AMBULATORY_CARE_PROVIDER_SITE_OTHER): Payer: BC Managed Care – PPO | Admitting: Physician Assistant

## 2020-06-19 ENCOUNTER — Encounter: Payer: Self-pay | Admitting: Physician Assistant

## 2020-06-19 DIAGNOSIS — Z23 Encounter for immunization: Secondary | ICD-10-CM

## 2020-06-19 DIAGNOSIS — M25462 Effusion, left knee: Secondary | ICD-10-CM

## 2020-06-19 MED ORDER — LIDOCAINE HCL 1 % IJ SOLN
1.0000 mL | INTRAMUSCULAR | Status: AC | PRN
  Administered 2020-06-19: 1 mL

## 2020-06-19 NOTE — Progress Notes (Signed)
Office Visit Note   Patient: Brent Lam           Date of Birth: Apr 23, 1956           MRN: [id number] Visit Date: 06/19/2020              Requested by: Danella Penton, MD [phone] [facility] [address] [facility] [place],  [place] [address] PCP: Danella Penton, MD  Chief Complaint  Patient presents with  . Left Foot - Follow-up      HPI: This is a pleasant 64 year old gentleman who is status post left transmetatarsal amputation.  With regards to his foot he is doing quite well.  He has been using Trental and a nitroglycerin patch.  He has developed a painless but noticeable knot just below his left kneecap.  He said it was much bigger her started to resolve wondering if he could have it drained today denies any fever chills or any pain at all  Assessment & Plan: Visit Diagnoses: No diagnosis found.  Plan: They will keep a compressive wrap on it for the next 48 hours.  If it does reaccumulate would be happy to reaspirate.  Follow-Up Instructions: No follow-ups on file.   Ortho Exam  Patient is alert, oriented, no adenopathy, well-dressed, normal affect, normal respiratory effort. Left transmetatarsal amputation stump is completely healed.  Just very one very small small eschar no erythema minimal swelling. Just below the kneecap in the area of the insertion of the patellar tendon into the tibia there is a fluctuant mass.  There is no cellulitis there is no tenderness to palpation no evidence of an infective process.  He is able to straight leg is and extend his knee. Aspirate was obtained distal to the patella at the insertion of the patellar tendon into the tibia   Imaging: No results found. No images are attached to the encounter.  Labs: Lab Results  Component Value Date   HGBA1C 7.1 (H) 01/21/2020   REPTSTATUS 05/21/2020 FINAL 05/19/2020   GRAMSTAIN NO WBC SEEN RARE GRAM POSITIVE COCCI  01/23/2020   CULT 50,000 COLONIES/mL STAPHYLOCOCCUS HAEMOLYTICUS (A)  05/19/2020   LABORGA STAPHYLOCOCCUS HAEMOLYTICUS (A) 05/19/2020     Lab Results  Component Value Date   ALBUMIN 3.7 01/21/2020    No results found for: MG No results found for: VD25OH  No results found for: PREALBUMIN CBC EXTENDED Latest Ref Rng & Units 01/29/2020 01/28/2020 01/26/2020  WBC 4.0 - 10.5 K/uL 11.9(H) 11.3(H) 13.2(H)  RBC 4.22 - 5.81 MIL/uL 4.43 4.36 4.45  HGB 13.0 - 17.0 g/dL 97.0 12.9(L) 13.1  HCT 39 - 52 % 39.9 39.3 40.4  PLT 150 - 400 K/uL 355 318 280  NEUTROABS 1.7 - 7.7 K/uL - - -  LYMPHSABS 0.7 - 4.0 K/uL - - -     There is no height or weight on file to calculate BMI.  Orders:  No orders of the defined types were placed in this encounter.  No orders of the defined types were placed in this encounter.    Procedures: Large Joint Inj: L knee on 06/19/2020 9:10 AM Indications: pain and diagnostic evaluation Details: 18 G 1.5 in needle, lateral approach  Arthrogram: No  Medications: 1 mL lidocaine 1 % Aspirate: 8 mL bloody Outcome: tolerated well, no immediate complications Procedure, treatment alternatives, risks and benefits explained, specific risks discussed. Consent was given by the patient.      Clinical Data: No additional findings.  ROS:  All other systems negative, except as noted in the HPI. Review of Systems  Objective: Vital Signs: There were no vitals taken for this visit.  Specialty Comments:  No specialty comments available.  PMFS History: Patient Active Problem List   Diagnosis Date Noted  . Gangrene of toe of left foot (HCC)   . PVD (peripheral vascular disease) (HCC)   . CKD (chronic kidney disease) stage 3, GFR 30-59 ml/min 01/21/2020  . HTN (hypertension) 01/21/2020  . DM2 (diabetes mellitus, type 2) (HCC) 01/21/2020  . Leukocytosis 01/21/2020  . Sepsis (HCC) 01/21/2020  . Osteomyelitis of toe of left foot (HCC) 01/21/2020  . Osteoarthritis of toe joint, left 01/21/2020   Past Medical History:  Diagnosis Date    . Diabetes mellitus, type 2 (HCC)    diet controlled  . History of kidney stones    X2 4or 5 yrs ago  . Hypertension    controlled on meds    Family History  Problem Relation Age of Onset  . Diabetes Mother   . Diabetes Father     Past Surgical History:  Procedure Laterality Date  . ABDOMINAL AORTOGRAM W/LOWER EXTREMITY Bilateral 01/28/2020   Procedure: ABDOMINAL AORTOGRAM W/LOWER EXTREMITY;  Surgeon: Maeola Harman, MD;  Location: [facility] INVASIVE CV LAB;  Service: Cardiovascular;  Laterality: Bilateral;  . AMPUTATION Left 01/23/2020   Procedure: TRANSMETATARSAL AMPUTATION FOOT;  Surgeon: Nadara Mustard, MD;  Location: [facility] OR;  Service: Orthopedics;  Laterality: Left;  . CATARACT EXTRACTION W/PHACO Left 03/19/2019   Procedure: CATARACT EXTRACTION PHACO AND INTRAOCULAR LENS PLACEMENT (IOC)  LEFT DIABETIC symfony lens;  Surgeon: Nevada Crane, MD;  Location: [facility];  Service: Ophthalmology;  Laterality: Left;  Diabetic - oral meds  . CATARACT EXTRACTION W/PHACO Right 04/23/2019   Procedure: CATARACT EXTRACTION PHACO AND INTRAOCULAR LENS PLACEMENT (IOC)  RIGHT DIABETIC SYMFONY TORIC LENS;  Surgeon: Nevada Crane, MD;  Location: [facility];  Service: Ophthalmology;  Laterality: Right;  Diabetes-oral med  . ROTATOR CUFF REPAIR  10/2015   Social History   Occupational History  . Not on file  Tobacco Use  . Smoking status: Never Smoker  . Smokeless tobacco: Never Used  Vaping Use  . Vaping Use: Never used  Substance and Sexual Activity  . Alcohol use: Not Currently  . Drug use: Never  . Sexual activity: Not on file

## 2020-07-31 ENCOUNTER — Encounter: Payer: Self-pay | Admitting: Orthopedic Surgery

## 2020-07-31 ENCOUNTER — Ambulatory Visit (INDEPENDENT_AMBULATORY_CARE_PROVIDER_SITE_OTHER): Payer: BC Managed Care – PPO | Admitting: Orthopedic Surgery

## 2020-07-31 VITALS — Ht 72.0 in | Wt 210.0 lb

## 2020-07-31 DIAGNOSIS — Z89432 Acquired absence of left foot: Secondary | ICD-10-CM

## 2020-08-15 ENCOUNTER — Encounter: Payer: Self-pay | Admitting: Orthopedic Surgery

## 2020-08-15 NOTE — Progress Notes (Signed)
Office Visit Note   Patient: Brent Lam           Date of Birth: 1956-01-04           MRN: [id number] Visit Date: 07/31/2020              Requested by: Danella Penton, MD [phone] [facility] [address] [facility] [place],  [place] [address] PCP: Danella Penton, MD  Chief Complaint  Patient presents with   Left Foot - Follow-up    01/23/20 left transmet amputation       HPI: Patient is a 64 year old gentleman who presents 6 months status post left transmetatarsal amputation he is full weightbearing in regular shoes feels well has no concerns.  Patient states he is not using the nitroglycerin or Trental  Assessment & Plan: Visit Diagnoses: No diagnosis found.  Plan: Patient will continue with activities as tolerated work on Achilles stretching  Follow-Up Instructions: Return if symptoms worsen or fail to improve.   Ortho Exam  Patient is alert, oriented, no adenopathy, well-dressed, normal affect, normal respiratory effort. Examination patient has a good dorsiflexion of the ankle.  He has a small 5 mm wound approximately 2 mm deep with no cellulitis no exposed bone or tendon no signs of infection.  Imaging: No results found. No images are attached to the encounter.  Labs: Lab Results  Component Value Date   HGBA1C 7.1 (H) 01/21/2020   REPTSTATUS 05/21/2020 FINAL 05/19/2020   GRAMSTAIN NO WBC SEEN RARE GRAM POSITIVE COCCI  01/23/2020   CULT 50,000 COLONIES/mL STAPHYLOCOCCUS HAEMOLYTICUS (A) 05/19/2020   LABORGA STAPHYLOCOCCUS HAEMOLYTICUS (A) 05/19/2020     Lab Results  Component Value Date   ALBUMIN 3.7 01/21/2020    No results found for: MG No results found for: VD25OH  No results found for: PREALBUMIN CBC EXTENDED Latest Ref Rng & Units 01/29/2020 01/28/2020 01/26/2020  WBC 4.0 - 10.5 K/uL 11.9(H) 11.3(H) 13.2(H)  RBC 4.22 - 5.81 MIL/uL 4.43 4.36 4.45  HGB 13.0 - 17.0 g/dL 15.7 12.9(L) 13.1  HCT 39 - 52 % 39.9 39.3 40.4  PLT 150 - 400 K/uL  355 318 280  NEUTROABS 1.7 - 7.7 K/uL - - -  LYMPHSABS 0.7 - 4.0 K/uL - - -     Body mass index is 28.48 kg/m.  Orders:  No orders of the defined types were placed in this encounter.  No orders of the defined types were placed in this encounter.    Procedures: No procedures performed  Clinical Data: No additional findings.  ROS:  All other systems negative, except as noted in the HPI. Review of Systems  Objective: Vital Signs: Ht 6' (1.829 m)    Wt 210 lb (95.3 kg)    BMI 28.48 kg/m   Specialty Comments:  No specialty comments available.  PMFS History: Patient Active Problem List   Diagnosis Date Noted   Gangrene of toe of left foot (HCC)    PVD (peripheral vascular disease) (HCC)    CKD (chronic kidney disease) stage 3, GFR 30-59 ml/min (HCC) 01/21/2020   HTN (hypertension) 01/21/2020   DM2 (diabetes mellitus, type 2) (HCC) 01/21/2020   Leukocytosis 01/21/2020   Sepsis (HCC) 01/21/2020   Osteomyelitis of toe of left foot (HCC) 01/21/2020   Osteoarthritis of toe joint, left 01/21/2020   Past Medical History:  Diagnosis Date   Diabetes mellitus, type 2 (HCC)    diet controlled   History of kidney stones    X2  4or 5 yrs ago   Hypertension    controlled on meds    Family History  Problem Relation Age of Onset   Diabetes Mother    Diabetes Father     Past Surgical History:  Procedure Laterality Date   ABDOMINAL AORTOGRAM W/LOWER EXTREMITY Bilateral 01/28/2020   Procedure: ABDOMINAL AORTOGRAM W/LOWER EXTREMITY;  Surgeon: Maeola Harman, MD;  [facility]: [facility] INVASIVE CV LAB;  Service: Cardiovascular;  Laterality: Bilateral;   AMPUTATION Left 01/23/2020   Procedure: TRANSMETATARSAL AMPUTATION FOOT;  Surgeon: Nadara Mustard, MD;  Location: [facility] OR;  Service: Orthopedics;  Laterality: Left;   CATARACT EXTRACTION W/PHACO Left 03/19/2019   Procedure: CATARACT EXTRACTION PHACO AND INTRAOCULAR LENS PLACEMENT (IOC)  LEFT DIABETIC symfony lens;   Surgeon: Nevada Crane, MD;  Location: [facility];  Service: Ophthalmology;  Laterality: Left;  Diabetic - oral meds   CATARACT EXTRACTION W/PHACO Right 04/23/2019   Procedure: CATARACT EXTRACTION PHACO AND INTRAOCULAR LENS PLACEMENT (IOC)  RIGHT DIABETIC SYMFONY TORIC LENS;  Surgeon: Nevada Crane, MD;  Location: [facility];  Service: Ophthalmology;  Laterality: Right;  Diabetes-oral med   ROTATOR CUFF REPAIR  10/2015   Social History   Occupational History   Not on file  Tobacco Use   Smoking status: Never Smoker   Smokeless tobacco: Never Used  Vaping Use   Vaping Use: Never used  Substance and Sexual Activity   Alcohol use: Not Currently   Drug use: Never   Sexual activity: Not on file

## 2021-08-18 ENCOUNTER — Other Ambulatory Visit: Payer: Self-pay

## 2021-08-18 ENCOUNTER — Ambulatory Visit: Payer: BC Managed Care – PPO | Admitting: Orthopedic Surgery

## 2021-08-18 DIAGNOSIS — L97511 Non-pressure chronic ulcer of other part of right foot limited to breakdown of skin: Secondary | ICD-10-CM | POA: Diagnosis not present

## 2021-08-18 DIAGNOSIS — Z89432 Acquired absence of left foot: Secondary | ICD-10-CM | POA: Diagnosis not present

## 2021-08-19 ENCOUNTER — Encounter: Payer: Self-pay | Admitting: Orthopedic Surgery

## 2021-08-19 MED ORDER — MUPIROCIN 2 % EX OINT: 1.0000 "application " | TOPICAL_OINTMENT | Freq: Every day | CUTANEOUS | 0 refills | Status: DC

## 2021-08-19 MED ORDER — CEPHALEXIN 500 MG PO CAPS: 500.0000 mg | ORAL_CAPSULE | Freq: Three times a day (TID) | ORAL | 0 refills | Status: DC

## 2021-09-04 ENCOUNTER — Encounter: Payer: Self-pay | Admitting: Orthopedic Surgery

## 2021-09-04 NOTE — Progress Notes (Signed)
Office Visit Note   Patient: Brent Lam           Date of Birth: 01-16-1956           MRN: [id number] Visit Date: 08/18/2021              Requested by: Rusty Aus, MD [facility] [place],  [place] [address] PCP: Rusty Aus, MD  Chief Complaint  Patient presents with   Right Foot - Wound Check    About 3 days ago and then a granddaughter stepped on his foot.  He states the blister started yesterday.  HPI: Patient is a 65 year old gentleman who presents with a ulcer second toe right foot complaining of redness and swelling he is status post a left transmetatarsal amputation.  Patient states he hit the toe  Assessment & Plan: Visit Diagnoses:  1. History of transmetatarsal amputation of left foot ([place])   2. Non-pressure chronic ulcer of other part of right foot limited to breakdown of skin ([place])     Plan: The second toenail was excised without complications patient was given a prescription for doxycycline and Bactroban.  Follow-Up Instructions: Return if symptoms worsen or fail to improve.   Ortho Exam  Patient is alert, oriented, no adenopathy, well-dressed, normal affect, normal respiratory effort. Examination patient has a paronychial infection with cellulitis of the second toe.  After informed consent the toe was sterilely prepped with Betadine patient underwent a digital block with 10 cc of 1% lidocaine plain the nail was removed without complication.  Dry dressing applied.  Imaging: No results found. No images are attached to the encounter.  Labs: Lab Results  Component Value Date   HGBA1C 7.1 (H) 01/21/2020   REPTSTATUS 05/21/2020 FINAL 05/19/2020   GRAMSTAIN NO WBC SEEN RARE GRAM POSITIVE COCCI  01/23/2020   CULT 50,000 COLONIES/mL STAPHYLOCOCCUS HAEMOLYTICUS (A) 05/19/2020   LABORGA STAPHYLOCOCCUS HAEMOLYTICUS (A) 05/19/2020     Lab Results  Component Value Date   ALBUMIN 3.7 01/21/2020    No results  found for: MG No results found for: VD25OH  No results found for: PREALBUMIN CBC EXTENDED Latest Ref Rng & Units 01/29/2020 01/28/2020 01/26/2020  WBC 4.0 - 10.5 K/uL 11.9(H) 11.3(H) 13.2(H)  RBC 4.22 - 5.81 MIL/uL 4.43 4.36 4.45  HGB 13.0 - 17.0 g/dL 13.1 12.9(L) 13.1  HCT 39.0 - 52.0 % 39.9 39.3 40.4  PLT 150 - 400 K/uL 355 318 280  NEUTROABS 1.7 - 7.7 K/uL - - -  LYMPHSABS 0.7 - 4.0 K/uL - - -     There is no height or weight on file to calculate BMI.  Orders:  No orders of the defined types were placed in this encounter.  Meds ordered this encounter  Medications   mupirocin ointment (BACTROBAN) 2 %    Sig: Apply 1 application topically daily.    Dispense:  22 g    Refill:  0   cephALEXin (KEFLEX) 500 MG capsule    Sig: Take 1 capsule (500 mg total) by mouth 3 (three) times daily.    Dispense:  30 capsule    Refill:  0     Procedures: No procedures performed  Clinical Data: No additional findings.  ROS:  All other systems negative, except as noted in the HPI. Review of Systems  Objective: Vital Signs: There were no vitals taken for this visit.  Specialty Comments:  No specialty comments available.  PMFS History: Patient Active Problem  List   Diagnosis Date Noted   Gangrene of toe of left foot (HCC)    PVD (peripheral vascular disease) (HCC)    CKD (chronic kidney disease) stage 3, GFR 30-59 ml/min (HCC) 01/21/2020   HTN (hypertension) 01/21/2020   DM2 (diabetes mellitus, type 2) (HCC) 01/21/2020   Leukocytosis 01/21/2020   Sepsis (HCC) 01/21/2020   Osteomyelitis of toe of left foot (HCC) 01/21/2020   Osteoarthritis of toe joint, left 01/21/2020   Past Medical History:  Diagnosis Date   Diabetes mellitus, type 2 (HCC)    diet controlled   History of kidney stones    X2 4or 5 yrs ago   Hypertension    controlled on meds    Family History  Problem Relation Age of Onset   Diabetes Mother    Diabetes Father     Past Surgical History:  Procedure  Laterality Date   ABDOMINAL AORTOGRAM W/LOWER EXTREMITY Bilateral 01/28/2020   Procedure: ABDOMINAL AORTOGRAM W/LOWER EXTREMITY;  Surgeon: Maeola Harman, MD;  Location: [facility] INVASIVE CV LAB;  Service: Cardiovascular;  Laterality: Bilateral;   AMPUTATION Left 01/23/2020   Procedure: TRANSMETATARSAL AMPUTATION FOOT;  Surgeon: Nadara Mustard, MD;  Location: [facility] OR;  Service: Orthopedics;  Laterality: Left;   CATARACT EXTRACTION W/PHACO Left 03/19/2019   Procedure: CATARACT EXTRACTION PHACO AND INTRAOCULAR LENS PLACEMENT (IOC)  LEFT DIABETIC symfony lens;  Surgeon: Nevada Crane, MD;  Location: [facility];  Service: Ophthalmology;  Laterality: Left;  Diabetic - oral meds   CATARACT EXTRACTION W/PHACO Right 04/23/2019   Procedure: CATARACT EXTRACTION PHACO AND INTRAOCULAR LENS PLACEMENT (IOC)  RIGHT DIABETIC SYMFONY TORIC LENS;  Surgeon: Nevada Crane, MD;  Location: [facility];  Service: Ophthalmology;  Laterality: Right;  Diabetes-oral med   ROTATOR CUFF REPAIR  10/2015   Social History   Occupational History   Not on file  Tobacco Use   Smoking status: Never   Smokeless tobacco: Never  Vaping Use   Vaping Use: Never used  Substance and Sexual Activity   Alcohol use: Not Currently   Drug use: Never   Sexual activity: Not on file

## 2021-09-16 ENCOUNTER — Other Ambulatory Visit: Payer: Self-pay

## 2021-09-16 ENCOUNTER — Ambulatory Visit: Payer: BC Managed Care – PPO

## 2021-11-17 IMAGING — CR DG FOOT COMPLETE 3+V*L*
3 series · 3 of 3 positions shown · non-contrast
Comparison: None.

CLINICAL DATA: Fourth toe infection.

EXAM:
LEFT FOOT - COMPLETE 3+ VIEW

[x foot ap left]
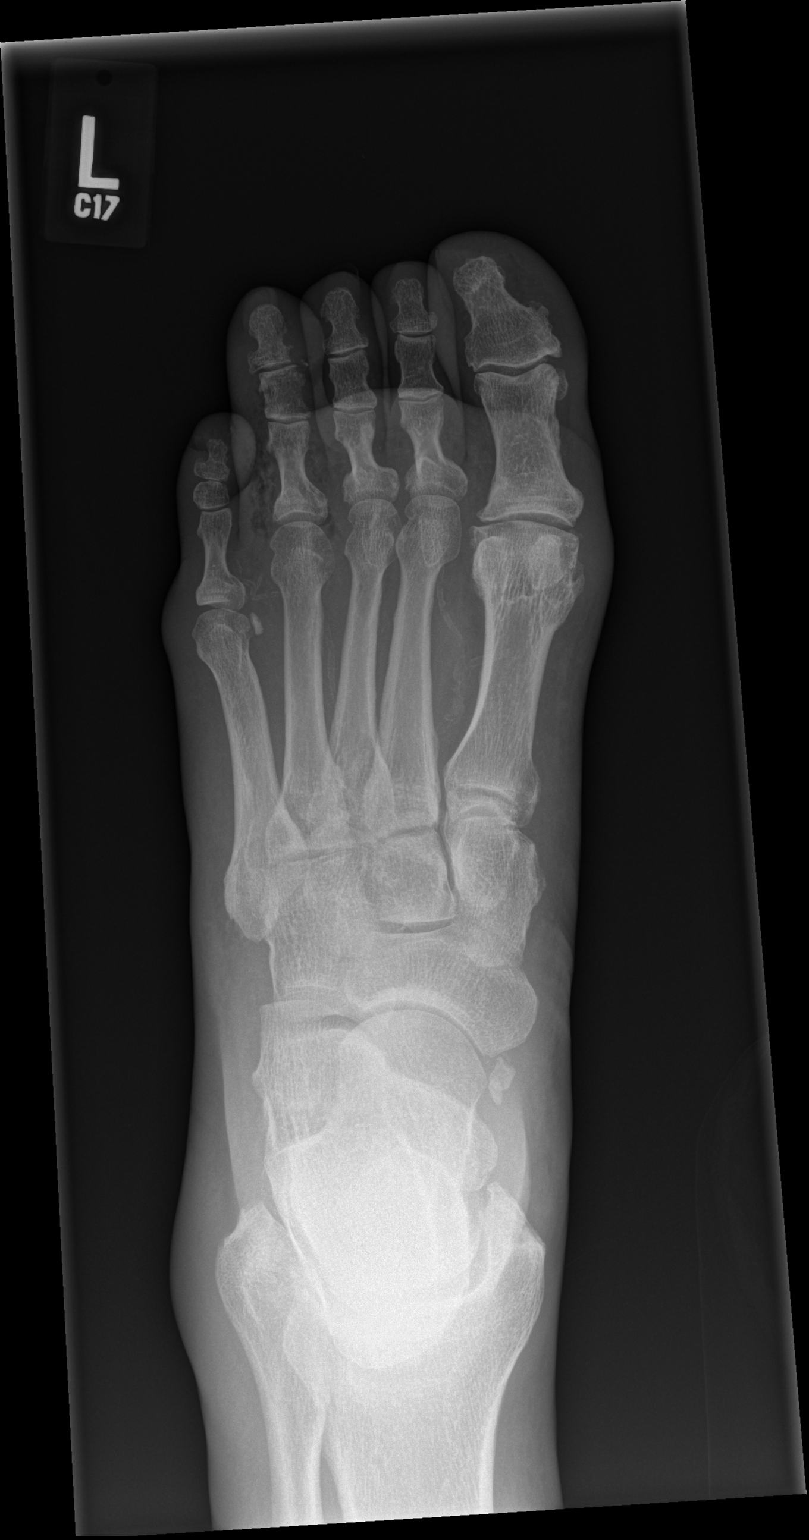

[x foot obl left]
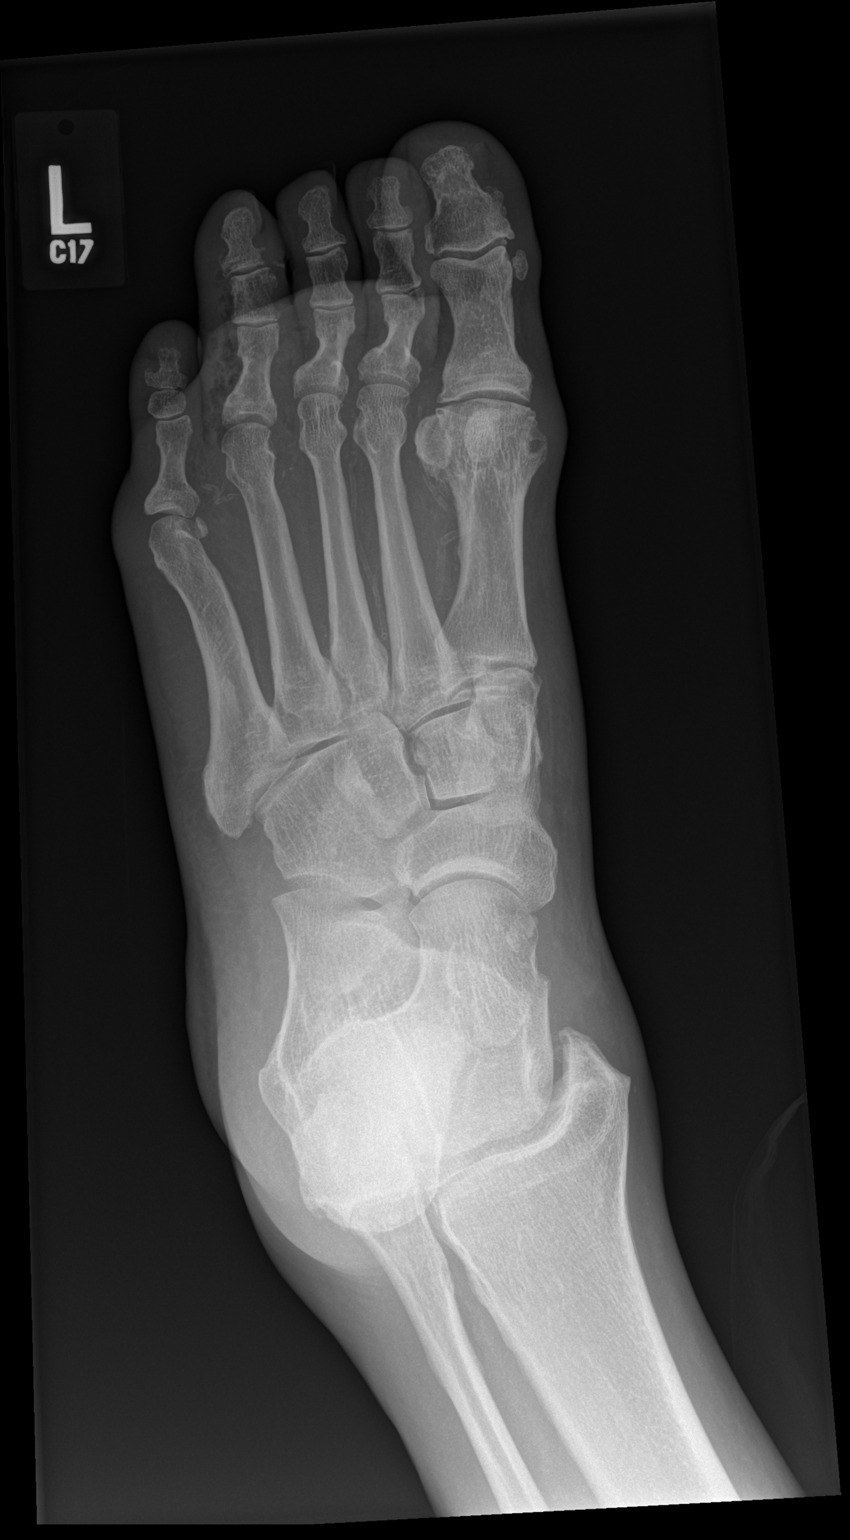

[x foot lat left]
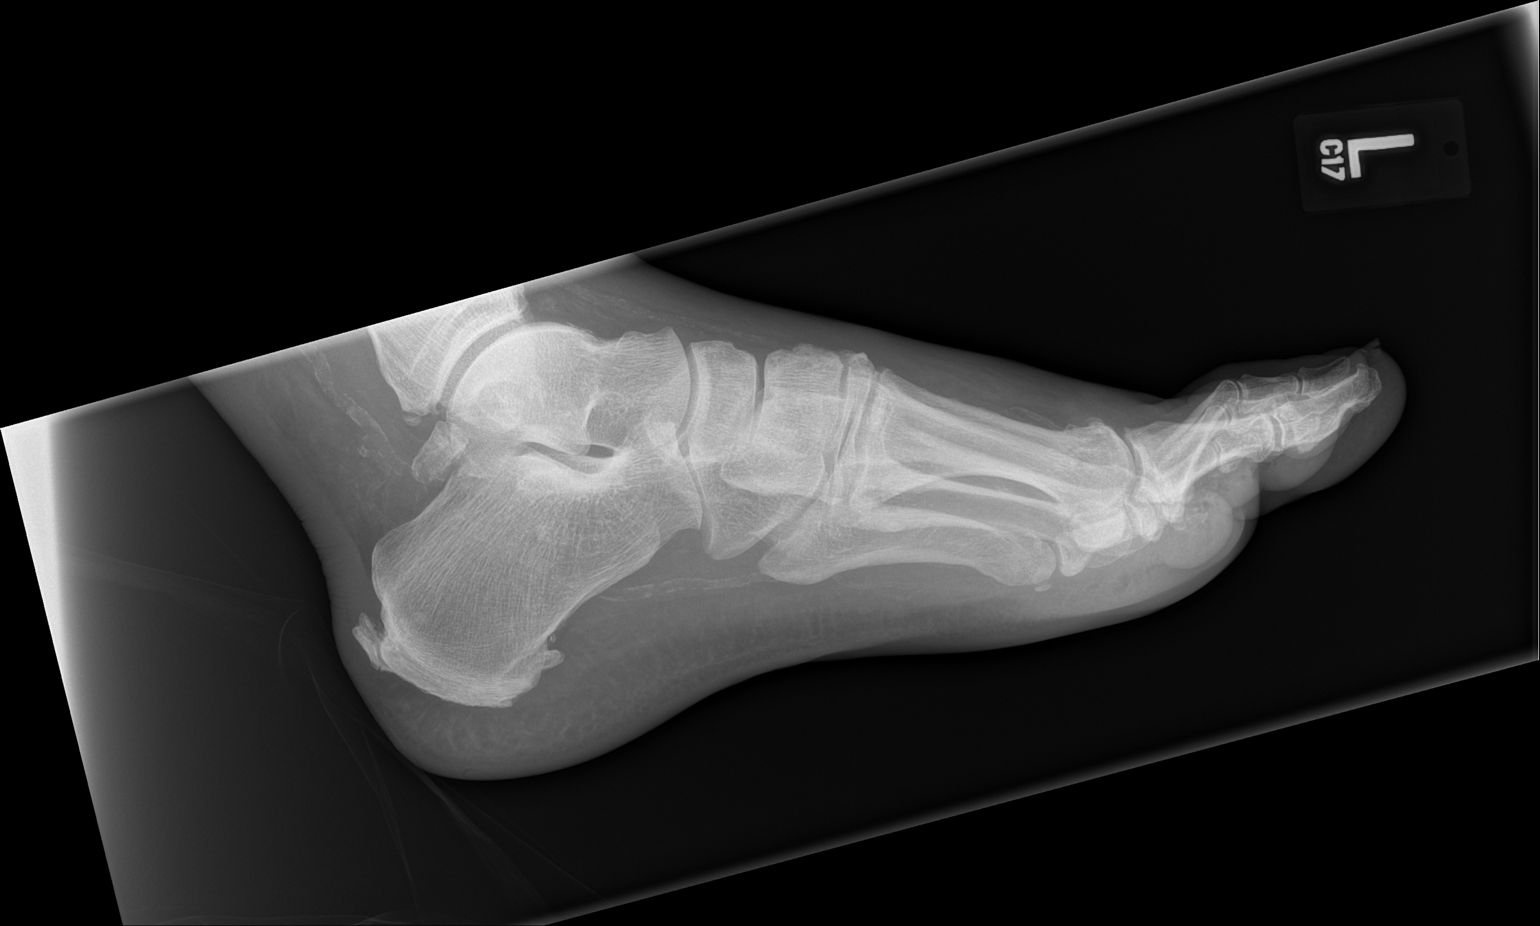

[3 of 3 positions shown; findings below may reference images not displayed]

FINDINGS: Ulceration along the medial fourth toe at the DIP joint with
underlying erosion of the medial fourth middle and distal phalanges.
Prominent soft tissue gas in the fourth toe with fourth toe and
distal forefoot soft tissue swelling. No acute fracture or
dislocation. Mild osteoarthritis of the first MTP joint. Bone
mineralization is normal. Large plantar and Achilles enthesophytes.
Vascular calcifications.
IMPRESSION: 1. Ulceration along the medial fourth toe with underlying
osteomyelitis of the fourth middle and distal phalanges and
gangrenous soft tissue infection of the fourth toe.

## 2021-12-29 ENCOUNTER — Other Ambulatory Visit: Payer: Self-pay | Admitting: Orthopedic Surgery

## 2021-12-31 ENCOUNTER — Telehealth: Payer: Self-pay | Admitting: Orthopedic Surgery

## 2021-12-31 NOTE — Telephone Encounter (Signed)
Error

## 2022-01-01 ENCOUNTER — Ambulatory Visit (INDEPENDENT_AMBULATORY_CARE_PROVIDER_SITE_OTHER): Payer: BC Managed Care – PPO | Admitting: Family

## 2022-01-01 ENCOUNTER — Encounter: Payer: Self-pay | Admitting: Family

## 2022-01-01 DIAGNOSIS — L97511 Non-pressure chronic ulcer of other part of right foot limited to breakdown of skin: Secondary | ICD-10-CM | POA: Diagnosis not present

## 2022-01-01 DIAGNOSIS — Z89432 Acquired absence of left foot: Secondary | ICD-10-CM

## 2022-01-01 NOTE — Progress Notes (Signed)
? ?Office Visit Note ?  ?Patient: Brent Lam           ?Date of Birth: [birth year]           ?MRN: [id number] ?Visit Date: 01/01/2022 ?             ?Requested by: Rusty Aus, MD ?[place] [facility] ?[place],  [place] [address] ?PCP: Rusty Aus, MD ? ?No chief complaint on file. ? ? ? ? ?HPI: ?The patient is a 66 year old gentleman who presents today with a concern of new wound to his transmetatarsal amputation on the right he has been much more active and did also get some new shoe wear.  He is concerned about a dark area to the middle of his foot beneath the amputation ? ?Has not had any drainage no fever no chills no warmth ? ?Assessment & Plan: ?Visit Diagnoses: No diagnosis found. ? ?Plan: Daily Dial soap cleansing.  Dry dressing changes or antibacterial ointment discussed offloading this close monitoring given a donut for pressure relief ? ?Follow-Up Instructions: No follow-ups on file.  ? ?Ortho Exam ? ?Patient is alert, oriented, no adenopathy, well-dressed, normal affect, normal respiratory effort. ?On examination of the right foot the patient has a ulcer beneath the transmetatarsal amputation distally on the right this is about 2 cm in diameter this was debrided with a 10 blade knife back to viable tissue there is unusual underlying granulation no drainage no odor no surrounding erythema or warmth does have a palpable dorsalis pedis pulse ? ?Imaging: ?No results found. ?No images are attached to the encounter. ? ?Labs: ?Lab Results  ?Component Value Date  ? HGBA1C 7.1 (H) 01/21/2020  ? REPTSTATUS 05/21/2020 FINAL 05/19/2020  ? GRAMSTAIN NO WBC SEEN ?RARE GRAM POSITIVE COCCI ? 01/23/2020  ? CULT 50,000 COLONIES/mL STAPHYLOCOCCUS HAEMOLYTICUS (A) 05/19/2020  ? LABORGA STAPHYLOCOCCUS HAEMOLYTICUS (A) 05/19/2020  ? ? ? ?Lab Results  ?Component Value Date  ? ALBUMIN 3.7 01/21/2020  ? ? ?No results found for: MG ?No results found for: VD25OH ? ?No results found for:  PREALBUMIN ? ?  Latest Ref Rng & Units 01/29/2020  ?  1:36 AM 01/28/2020  ?  5:43 AM 01/26/2020  ?  8:42 AM  ?CBC EXTENDED  ?WBC 4.0 - 10.5 K/uL 11.9   11.3   13.2    ?RBC 4.22 - 5.81 MIL/uL 4.43   4.36   4.45    ?Hemoglobin 13.0 - 17.0 g/dL 13.1   12.9   13.1    ?HCT 39.0 - 52.0 % 39.9   39.3   40.4    ?Platelets 150 - 400 K/uL 355   318   280    ? ? ? ?There is no height or weight on file to calculate BMI. ? ?Orders:  ?No orders of the defined types were placed in this encounter. ? ?No orders of the defined types were placed in this encounter. ? ? ? Procedures: ?No procedures performed ? ?Clinical Data: ?No additional findings. ? ?ROS: ? ?All other systems negative, except as noted in the HPI. ?Review of Systems  ?Constitutional:  Negative for chills and fever.  ?Skin:  Positive for wound.  ? ?Objective: ?Vital Signs: There were no vitals taken for this visit. ? ?Specialty Comments:  ?No specialty comments available. ? ?PMFS History: ?Patient Active Problem List  ? Diagnosis Date Noted  ? Gangrene of toe of left foot ([place])   ? PVD (peripheral vascular disease) ([place])   ?  CKD (chronic kidney disease) stage 3, GFR 30-59 ml/min (HCC) 01/21/2020  ? HTN (hypertension) 01/21/2020  ? DM2 (diabetes mellitus, type 2) ([place]) 01/21/2020  ? Leukocytosis 01/21/2020  ? Sepsis ([place]) 01/21/2020  ? Osteomyelitis of toe of left foot ([place]) 01/21/2020  ? Osteoarthritis of toe joint, left 01/21/2020  ? ?Past Medical History:  ?Diagnosis Date  ? Diabetes mellitus, type 2 ([place])   ? diet controlled  ? History of kidney stones   ? X2 4or 5 yrs ago  ? Hypertension   ? controlled on meds  ?  ?Family History  ?Problem Relation Age of Onset  ? Diabetes Mother   ? Diabetes Father   ?  ?Past Surgical History:  ?Procedure Laterality Date  ? ABDOMINAL AORTOGRAM W/LOWER EXTREMITY Bilateral 01/28/2020  ? Procedure: ABDOMINAL AORTOGRAM W/LOWER EXTREMITY;  Surgeon: Waynetta Sandy, MD;  Location: [place] CV LAB;  Service: Cardiovascular;   Laterality: Bilateral;  ? AMPUTATION Left 01/23/2020  ? Procedure: TRANSMETATARSAL AMPUTATION FOOT;  Surgeon: Newt Minion, MD;  Location: [place];  Service: Orthopedics;  Laterality: Left;  ? CATARACT EXTRACTION W/PHACO Left 03/19/2019  ? Procedure: CATARACT EXTRACTION PHACO AND INTRAOCULAR LENS PLACEMENT (IOC)  LEFT DIABETIC symfony lens;  Surgeon: Eulogio Bear, MD;  Location: [place];  Service: Ophthalmology;  Laterality: Left;  Diabetic - oral meds  ? CATARACT EXTRACTION W/PHACO Right 04/23/2019  ? Procedure: CATARACT EXTRACTION PHACO AND INTRAOCULAR LENS PLACEMENT ([place])  RIGHT DIABETIC SYMFONY TORIC LENS;  Surgeon: Eulogio Bear, MD;  Location: [place];  Service: Ophthalmology;  Laterality: Right;  Diabetes-oral med  ? ROTATOR CUFF REPAIR  10/2015  ? ?Social History  ? ?Occupational History  ? Not on file  ?Tobacco Use  ? Smoking status: Never  ? Smokeless tobacco: Never  ?Vaping Use  ? Vaping Use: Never used  ?Substance and Sexual Activity  ? Alcohol use: Not Currently  ? Drug use: Never  ? Sexual activity: Not on file  ? ? ? ? ? ?

## 2022-09-20 ENCOUNTER — Other Ambulatory Visit (INDEPENDENT_AMBULATORY_CARE_PROVIDER_SITE_OTHER): Payer: Medicare Other

## 2022-09-20 DIAGNOSIS — Z23 Encounter for immunization: Secondary | ICD-10-CM

## 2022-09-24 ENCOUNTER — Other Ambulatory Visit: Payer: Self-pay | Admitting: Orthopedic Surgery

## 2022-09-24 DIAGNOSIS — G8929 Other chronic pain: Secondary | ICD-10-CM

## 2022-10-04 DIAGNOSIS — L02416 Cutaneous abscess of left lower limb: Secondary | ICD-10-CM

## 2022-10-04 HISTORY — DX: Cutaneous abscess of left lower limb: L02.416

## 2022-10-12 ENCOUNTER — Ambulatory Visit
Admission: RE | Admit: 2022-10-12 | Discharge: 2022-10-12 | Disposition: A | Discharge status: Home / Self Care | Payer: Medicare Other | Source: Ambulatory Visit | Attending: Orthopedic Surgery | Admitting: Orthopedic Surgery

## 2022-10-12 ENCOUNTER — Encounter: Payer: Self-pay | Admitting: Orthopedic Surgery

## 2022-10-12 ENCOUNTER — Other Ambulatory Visit: Payer: Self-pay | Admitting: Orthopedic Surgery

## 2022-10-12 DIAGNOSIS — G8929 Other chronic pain: Secondary | ICD-10-CM

## 2022-10-13 ENCOUNTER — Other Ambulatory Visit: Payer: Medicare Other

## 2023-01-14 ENCOUNTER — Other Ambulatory Visit: Payer: Self-pay

## 2023-01-14 DIAGNOSIS — N390 Urinary tract infection, site not specified: Secondary | ICD-10-CM

## 2023-01-14 DIAGNOSIS — R1084 Generalized abdominal pain: Secondary | ICD-10-CM

## 2023-02-02 ENCOUNTER — Ambulatory Visit
Admission: RE | Admit: 2023-02-02 | Discharge: 2023-02-02 | Disposition: A | Discharge status: Home / Self Care | Payer: Medicare Other | Source: Ambulatory Visit | Attending: Internal Medicine | Admitting: Internal Medicine

## 2023-02-02 DIAGNOSIS — R1084 Generalized abdominal pain: Secondary | ICD-10-CM | POA: Insufficient documentation

## 2023-02-02 DIAGNOSIS — N390 Urinary tract infection, site not specified: Secondary | ICD-10-CM

## 2023-03-04 ENCOUNTER — Ambulatory Visit: Payer: Self-pay | Admitting: Urology

## 2023-03-18 ENCOUNTER — Ambulatory Visit (INDEPENDENT_AMBULATORY_CARE_PROVIDER_SITE_OTHER): Payer: Medicare Other | Admitting: Urology

## 2023-03-18 ENCOUNTER — Encounter: Payer: Self-pay | Admitting: Urology

## 2023-03-18 VITALS — BP 146/78 | HR 98 | Ht 72.0 in | Wt 256.0 lb

## 2023-03-18 DIAGNOSIS — N210 Calculus in bladder: Secondary | ICD-10-CM | POA: Diagnosis not present

## 2023-03-18 DIAGNOSIS — N200 Calculus of kidney: Secondary | ICD-10-CM

## 2023-03-18 DIAGNOSIS — N390 Urinary tract infection, site not specified: Secondary | ICD-10-CM

## 2023-03-18 LAB — URINALYSIS, COMPLETE
Bilirubin, UA: NEGATIVE
Ketones, UA: NEGATIVE
Nitrite, UA: NEGATIVE
Specific Gravity, UA: 1.025 (ref 1.005–1.030)
Urobilinogen, Ur: 1 mg/dL (ref 0.2–1.0)
pH, UA: 5.5 (ref 5.0–7.5)

## 2023-03-18 LAB — MICROSCOPIC EXAMINATION: WBC, UA: >30 /hpf — AB (ref 0–5)

## 2023-03-18 NOTE — Progress Notes (Signed)
I, Maysun L Gibbs,acting as a scribe for Riki Altes, MD.,have documented all relevant documentation on the behalf of Riki Altes, MD,as directed by  Riki Altes, MD while in the presence of Riki Altes, MD.  03/18/2023 11:08 AM   Brent Lam 1956/08/19 [id number]  Referring provider: Danella Penton, MD [phone] [facility] [address] [facility] [place],  [place] [address]  Chief Complaint  Patient presents with   New Patient (Initial Visit)    HPI: Brent Lam is a 67 y.o. male referred for evaluation of recurrent UTI.  He saw Dr. Hyacinth Meeker in March 2024 and was noted to have pyuria on urinalysis. Although asymptomatic, he was treated with a course of Septra DS and a 1 month follow up.  UA was performed, which showed 22 WBCs, though urine culture was negative.   Urology referral was recommended, and a CT of the abdomen and pelvis was ordered.  The CT showed bilateral non-obstructing renal calculi and a 15 mm bladder calculus.  He is asymptomatic and has no bothersome low urinary tract symptoms Denies dysuria, gross hematuria Denies flank, abdominal, or pelvic pain.  He has had prior SWL x2 for urinary tract stones.   PMH: Past Medical History:  Diagnosis Date   Diabetes mellitus, type 2 (HCC)    diet controlled   History of kidney stones    X2 4or 5 yrs ago   Hypertension    controlled on meds    Surgical History: Past Surgical History:  Procedure Laterality Date   ABDOMINAL AORTOGRAM W/LOWER EXTREMITY Bilateral 01/28/2020   Procedure: ABDOMINAL AORTOGRAM W/LOWER EXTREMITY;  Surgeon: Maeola Harman, MD;  Location: [facility] INVASIVE CV LAB;  Service: Cardiovascular;  Laterality: Bilateral;   AMPUTATION Left 01/23/2020   Procedure: TRANSMETATARSAL AMPUTATION FOOT;  Surgeon: Nadara Mustard, MD;  Location: [facility] OR;  Service: Orthopedics;  Laterality: Left;   CATARACT EXTRACTION W/PHACO Left 03/19/2019   Procedure: CATARACT EXTRACTION PHACO AND  INTRAOCULAR LENS PLACEMENT (IOC)  LEFT DIABETIC symfony lens;  Surgeon: Nevada Crane, MD;  Location: [facility];  Service: Ophthalmology;  Laterality: Left;  Diabetic - oral meds   CATARACT EXTRACTION W/PHACO Right 04/23/2019   Procedure: CATARACT EXTRACTION PHACO AND INTRAOCULAR LENS PLACEMENT (IOC)  RIGHT DIABETIC SYMFONY TORIC LENS;  Surgeon: Nevada Crane, MD;  Location: [facility];  Service: Ophthalmology;  Laterality: Right;  Diabetes-oral med   ROTATOR CUFF REPAIR  10/2015    Home Medications:  Allergies as of 03/18/2023   No Known Allergies      Medication List        Accurate as of March 18, 2023 11:08 AM. If you have any questions, ask your nurse or doctor.          STOP taking these medications    ALPRAZolam 0.5 MG tablet Commonly known as: Prudy Feeler Stopped by: Riki Altes, MD   cephALEXin 500 MG capsule Commonly known as: KEFLEX Stopped by: Riki Altes, MD   metoprolol tartrate 50 MG tablet Commonly known as: LOPRESSOR Stopped by: Riki Altes, MD   mupirocin ointment 2 % Commonly known as: BACTROBAN Stopped by: Riki Altes, MD   ondansetron 4 MG tablet Commonly known as: ZOFRAN Stopped by: Riki Altes, MD   silver sulfADIAZINE 1 % cream Commonly known as: SILVADENE Stopped by: Riki Altes, MD   traMADol 50 MG tablet Commonly known as: ULTRAM Stopped by: Riki Altes, MD  TAKE these medications    acetaminophen 500 MG tablet Commonly known as: TYLENOL Take 1,000 mg by mouth every 6 (six) hours as needed for moderate pain.   glipiZIDE 5 MG 24 hr tablet Commonly known as: GLUCOTROL XL Take 5 mg by mouth daily.   metFORMIN 500 MG tablet Commonly known as: GLUCOPHAGE Take 500 mg by mouth 2 (two) times daily with a meal.   metoprolol succinate 50 MG 24 hr tablet Commonly known as: TOPROL-XL Take 50 mg by mouth daily.   nitroGLYCERIN 0.2 mg/hr patch Commonly known as: NITRODUR - Dosed  in mg/24 hr PLACE 1 PATCH (0.2 MG TOTAL) ONTO THE SKIN DAILY.   pentoxifylline 400 MG CR tablet Commonly known as: TRENTAL TAKE 1 TABLET (400 MG TOTAL) BY MOUTH 3 (THREE) TIMES DAILY WITH MEALS.   pioglitazone 30 MG tablet Commonly known as: ACTOS Take 30 mg by mouth daily.   rosuvastatin 5 MG tablet Commonly known as: CRESTOR Take 5 mg by mouth daily.   VITAMIN B-12 SL Place under the tongue once a week.        Allergies: No Known Allergies  Family History: Family History  Problem Relation Age of Onset   Diabetes Mother    Diabetes Father     Social History:  reports that he has never smoked. He has never used smokeless tobacco. He reports that he does not currently use alcohol. He reports that he does not use drugs.   Physical Exam: BP (!) 146/78   Pulse 98   Ht 6' (1.829 m)   Wt 256 lb (116.1 kg)   BMI 34.72 kg/m   Constitutional:  Alert and oriented, No acute distress. HEENT: [place] AT, moist mucus membranes.  Trachea midline, no masses. Cardiovascular: No clubbing, cyanosis, or edema. Respiratory: Normal respiratory effort, no increased work of breathing. GI: Abdomen is soft, nontender, nondistended, no abdominal masses Skin: No rashes, bruises or suspicious lesions. Neurologic: Grossly intact, no focal deficits, moving all 4 extremities. Psychiatric: Normal mood and affect.   Pertinent Imaging: CT abdomen/pelvis performed 02/02/23 was personally reinterpreted.  CT Abdomen/pelvis  EXAM: CT ABDOMEN AND PELVIS WITHOUT CONTRAST   TECHNIQUE: Multidetector CT imaging of the abdomen and pelvis was performed following the standard protocol without IV contrast.   RADIATION DOSE REDUCTION: This exam was performed according to the departmental dose-optimization program which includes automated exposure control, adjustment of the mA and/or kV according to patient size and/or use of iterative reconstruction technique.   COMPARISON:  CT of the chest abdomen  pelvis dated 01/24/2020.   FINDINGS: Evaluation of this exam is limited in the absence of intravenous contrast.   Lower chest: There is a 4 mm nodule with a focus of calcification along the right minor fissure, possibly a granuloma. This is unchanged since the CT of 2021. The visualized lung bases are otherwise clear. There is coronary vascular calcification of the LAD and RCA.   No intra-abdominal free air or free fluid.   Hepatobiliary: Mild fatty liver. No biliary dilatation. The gallbladder is unremarkable.   Pancreas: Unremarkable. No pancreatic ductal dilatation or surrounding inflammatory changes.   Spleen: Normal in size without focal abnormality.   Adrenals/Urinary Tract: The adrenal glands are unremarkable. Nonobstructing bilateral renal calculi measure up to 6 mm in the inferior pole of the left kidney. There is no hydronephrosis on either side. No obstructing stone. The visualized ureters appear unremarkable. The bladder is partially distended. There is a 15 mm bladder calculus.   Stomach/Bowel: There  is a 4 cm duodenal diverticulum without active inflammatory changes. There is no bowel obstruction or active inflammation. The appendix is normal.   Vascular/Lymphatic: Mild aortoiliac atherosclerotic disease. The IVC is unremarkable. No portal venous gas. There is no adenopathy.   Reproductive: The prostate and seminal vesicles are grossly unremarkable. No pelvic mass.   Other: None   Musculoskeletal: Degenerative changes of the spine. No acute osseous pathology.   IMPRESSION: 1. Nonobstructing bilateral renal calculi. No hydronephrosis or obstructing stone. 2. A 15 mm bladder calculus. 3. No bowel obstruction. Normal appendix. 4. Mild fatty liver. 5. A 4 cm duodenal diverticulum. 6.  Aortic Atherosclerosis (ICD10-I70.0).     Electronically Signed   By: Elgie Collard M.D.   On: 02/03/2023 21:46  Assessment & Plan:    1. Bladder  calculus Asymptomatic bladder calculus UA today with >30 WBCs. He is asymptomatic and has had a negative culture and will not treat.  We discuss the bladder calculi if not treated will continue to grow and recommend scheduling cystolitholipaxi.  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 Since he is asymptomatic, will tentatively schedule in December. He was instructed to call earlier should he develop any symptoms.   2. Bilateral nephrolithiasis Asymptomatic non-obstructing renal calculi. He has elected surveillance.  [facility] [address] [place], [place] [address] [phone]

## 2023-03-20 ENCOUNTER — Other Ambulatory Visit: Payer: Self-pay | Admitting: Urology

## 2023-03-20 DIAGNOSIS — N210 Calculus in bladder: Secondary | ICD-10-CM

## 2023-03-20 NOTE — Progress Notes (Unsigned)
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

## 2023-03-21 ENCOUNTER — Telehealth: Payer: Self-pay

## 2023-03-21 NOTE — Telephone Encounter (Signed)
I spoke with Mr. Brent Lam and Significant Other, Brent Lam. We have discussed possible surgery dates and Tuesday December 17th, 2024 was agreed upon by all parties. Patient given information about surgery date, what to expect pre-operatively and post operatively.  We discussed that a Pre-Admission Testing office will be calling to set up the pre-op visit that will take place prior to surgery, and that these appointments are typically done over the phone with a Pre-Admissions RN. Informed patient that our office will communicate any additional care to be provided after surgery. Patients questions or concerns were discussed during our call. Advised to call our office should there be any additional information, questions or concerns that arise. Patient verbalized understanding.

## 2023-03-21 NOTE — Progress Notes (Signed)
   [facility] Surgical Posting Form  Surgery Date: Date: 09/20/2023  Surgeon: Dr. Irineo Axon, MD  Inpt ( No  )   Outpt (Yes)   Obs ( No  )   Diagnosis: N21.0 Bladder Stone  -CPT: [phone]  Surgery: Cystolitholapaxy  Stop Anticoagulations: No, may continue ASA  Cardiac/Medical/Pulmonary Clearance needed: no  *Orders entered into EPIC  Date: 03/21/23   *Case booked in [place]  Date: 03/21/23  *Notified pt of Surgery: Date: 03/21/23  PRE-OP UA & CX: yes, will obtain in clinic on 09/08/2023  *Placed into Prior Authorization Work Angela Nevin Date: 03/21/23  Assistant/laser/rep:No

## 2023-08-11 ENCOUNTER — Ambulatory Visit (INDEPENDENT_AMBULATORY_CARE_PROVIDER_SITE_OTHER): Payer: Medicare Other | Admitting: Orthopedic Surgery

## 2023-08-11 DIAGNOSIS — L02416 Cutaneous abscess of left lower limb: Secondary | ICD-10-CM | POA: Diagnosis not present

## 2023-08-11 MED ORDER — DOXYCYCLINE HYCLATE 100 MG PO TABS: 100.0000 mg | ORAL_TABLET | Freq: Two times a day (BID) | ORAL | 0 refills | Status: DC

## 2023-08-12 ENCOUNTER — Encounter: Payer: Self-pay | Admitting: Orthopedic Surgery

## 2023-08-12 NOTE — Progress Notes (Signed)
Office Visit Note   Patient: Brent Lam           Date of Birth: [birth year]           MRN: [id number] Visit Date: 08/11/2023              Requested by: Danella Penton, MD [phone] [facility] [address] [facility] [place],  [place] [address] PCP: Danella Penton, MD  Chief Complaint  Patient presents with   Left Leg - Wound Check      HPI: Patient is a 67 year old gentleman who is seen for acute wound left lower extremity.  Patient states is not painful but swollen.  He states he noticed it last night is unsure of the origin.  He denies any fever chills or nausea and vomiting.  He states that the area of redness and swelling has remained stable.  Assessment & Plan: Visit Diagnoses:  1. Abscess of left lower leg     Plan: A prescription was called in for doxycycline.  Recommended extra-large knee-high compression socks.  Follow-Up Instructions: Return in about 2 weeks (around 08/25/2023).   Ortho Exam  Patient is alert, oriented, no adenopathy, well-dressed, normal affect, normal respiratory effort. Examination patient has an area of 2 cm in diameter of redness and swelling.  With compression a foreign body was expressed from this wound there is serosanguineous drainage no purulence.  There is pitting edema with venous stasis changes in his leg.  Calf measures 44 cm in circumference.  No recent hemoglobin A1c.  Last hemoglobin A1c was 7.1 in 2021.  Imaging: No results found. No images are attached to the encounter.  Labs: Lab Results  Component Value Date   HGBA1C 7.1 (H) 01/21/2020   REPTSTATUS 05/21/2020 FINAL 05/19/2020   GRAMSTAIN NO WBC SEEN RARE GRAM POSITIVE COCCI  01/23/2020   CULT 50,000 COLONIES/mL STAPHYLOCOCCUS HAEMOLYTICUS (A) 05/19/2020   LABORGA STAPHYLOCOCCUS HAEMOLYTICUS (A) 05/19/2020     Lab Results  Component Value Date   ALBUMIN 3.7 01/21/2020    No results found for: "MG" No results found for: "VD25OH"  No results found  for: "PREALBUMIN"    Latest Ref Rng & Units 01/29/2020    1:36 AM 01/28/2020    5:43 AM 01/26/2020    8:42 AM  CBC EXTENDED  WBC 4.0 - 10.5 K/uL 11.9  11.3  13.2   RBC 4.22 - 5.81 MIL/uL 4.43  4.36  4.45   Hemoglobin 13.0 - 17.0 g/dL 78.4  69.6  29.5   HCT 39.0 - 52.0 % 39.9  39.3  40.4   Platelets 150 - 400 K/uL 355  318  280      There is no height or weight on file to calculate BMI.  Orders:  No orders of the defined types were placed in this encounter.  Meds ordered this encounter  Medications   doxycycline (VIBRA-TABS) 100 MG tablet    Sig: Take 1 tablet (100 mg total) by mouth 2 (two) times daily.    Dispense:  20 tablet    Refill:  0     Procedures: No procedures performed  Clinical Data: No additional findings.  ROS:  All other systems negative, except as noted in the HPI. Review of Systems  Objective: Vital Signs: There were no vitals taken for this visit.  Specialty Comments:  No specialty comments available.  PMFS History: Patient Active Problem List   Diagnosis Date Noted   Gangrene of toe of left foot (  HCC)    PVD (peripheral vascular disease) (HCC)    CKD (chronic kidney disease) stage 3, GFR 30-59 ml/min (HCC) 01/21/2020   HTN (hypertension) 01/21/2020   DM2 (diabetes mellitus, type 2) (HCC) 01/21/2020   Leukocytosis 01/21/2020   Sepsis (HCC) 01/21/2020   Osteomyelitis of toe of left foot (HCC) 01/21/2020   Osteoarthritis of toe joint, left 01/21/2020   Past Medical History:  Diagnosis Date   Diabetes mellitus, type 2 (HCC)    diet controlled   History of kidney stones    X2 4or 5 yrs ago   Hypertension    controlled on meds    Family History  Problem Relation Age of Onset   Diabetes Mother    Diabetes Father     Past Surgical History:  Procedure Laterality Date   ABDOMINAL AORTOGRAM W/LOWER EXTREMITY Bilateral 01/28/2020   Procedure: ABDOMINAL AORTOGRAM W/LOWER EXTREMITY;  Surgeon: Maeola Harman, MD;  Location: [facility]  INVASIVE CV LAB;  Service: Cardiovascular;  Laterality: Bilateral;   AMPUTATION Left 01/23/2020   Procedure: TRANSMETATARSAL AMPUTATION FOOT;  Surgeon: Nadara Mustard, MD;  Location: [facility] OR;  Service: Orthopedics;  Laterality: Left;   CATARACT EXTRACTION W/PHACO Left 03/19/2019   Procedure: CATARACT EXTRACTION PHACO AND INTRAOCULAR LENS PLACEMENT (IOC)  LEFT DIABETIC symfony lens;  Surgeon: Nevada Crane, MD;  Location: [facility];  Service: Ophthalmology;  Laterality: Left;  Diabetic - oral meds   CATARACT EXTRACTION W/PHACO Right 04/23/2019   Procedure: CATARACT EXTRACTION PHACO AND INTRAOCULAR LENS PLACEMENT (IOC)  RIGHT DIABETIC SYMFONY TORIC LENS;  Surgeon: Nevada Crane, MD;  Location: [facility];  Service: Ophthalmology;  Laterality: Right;  Diabetes-oral med   ROTATOR CUFF REPAIR  10/2015   Social History   Occupational History   Not on file  Tobacco Use   Smoking status: Never   Smokeless tobacco: Never  Vaping Use   Vaping status: Never Used  Substance and Sexual Activity   Alcohol use: Not Currently   Drug use: Never   Sexual activity: Not on file

## 2023-08-25 ENCOUNTER — Ambulatory Visit (INDEPENDENT_AMBULATORY_CARE_PROVIDER_SITE_OTHER): Payer: Medicare Other | Admitting: Orthopedic Surgery

## 2023-08-25 DIAGNOSIS — L02416 Cutaneous abscess of left lower limb: Secondary | ICD-10-CM

## 2023-09-05 ENCOUNTER — Encounter: Payer: Self-pay | Admitting: Orthopedic Surgery

## 2023-09-05 NOTE — Progress Notes (Signed)
Office Visit Note   Patient: Brent Lam           Date of Birth: May 15, 1956           MRN: [id number] Visit Date: 08/25/2023              Requested by: Danella Penton, MD [phone] [facility] [address] [facility] [place],  [place] [address] PCP: Danella Penton, MD  Chief Complaint  Patient presents with   Left Leg - Wound Check      HPI: Patient is a 67 year old gentleman who presents in follow-up for left lower extremity abscess.  Patient has completed his doxycycline.  He is wearing extra-large compression socks.  Assessment & Plan: Visit Diagnoses:  1. Abscess of left lower leg     Plan: Patient will continue with his compression socks.  Follow-up as needed.  Follow-Up Instructions: Return if symptoms worsen or fail to improve.   Ortho Exam  Patient is alert, oriented, no adenopathy, well-dressed, normal affect, normal respiratory effort. Examination there is no cellulitis the ulcer has healed there is no drainage.  Calf measures 42 cm in circumference.  Patient will continue with the extra-large sock.  Imaging: No results found.   Labs: Lab Results  Component Value Date   HGBA1C 7.1 (H) 01/21/2020   REPTSTATUS 05/21/2020 FINAL 05/19/2020   GRAMSTAIN NO WBC SEEN RARE GRAM POSITIVE COCCI  01/23/2020   CULT 50,000 COLONIES/mL STAPHYLOCOCCUS HAEMOLYTICUS (A) 05/19/2020   LABORGA STAPHYLOCOCCUS HAEMOLYTICUS (A) 05/19/2020     Lab Results  Component Value Date   ALBUMIN 3.7 01/21/2020    No results found for: "MG" No results found for: "VD25OH"  No results found for: "PREALBUMIN"    Latest Ref Rng & Units 01/29/2020    1:36 AM 01/28/2020    5:43 AM 01/26/2020    8:42 AM  CBC EXTENDED  WBC 4.0 - 10.5 K/uL 11.9  11.3  13.2   RBC 4.22 - 5.81 MIL/uL 4.43  4.36  4.45   Hemoglobin 13.0 - 17.0 g/dL 78.2  95.6  21.3   HCT 39.0 - 52.0 % 39.9  39.3  40.4   Platelets 150 - 400 K/uL 355  318  280      There is no height or weight on file to  calculate BMI.  Orders:  No orders of the defined types were placed in this encounter.  No orders of the defined types were placed in this encounter.    Procedures: No procedures performed  Clinical Data: No additional findings.  ROS:  All other systems negative, except as noted in the HPI. Review of Systems  Objective: Vital Signs: There were no vitals taken for this visit.  Specialty Comments:  No specialty comments available.  PMFS History: Patient Active Problem List   Diagnosis Date Noted   Gangrene of toe of left foot (HCC)    PVD (peripheral vascular disease) (HCC)    CKD (chronic kidney disease) stage 3, GFR 30-59 ml/min (HCC) 01/21/2020   HTN (hypertension) 01/21/2020   DM2 (diabetes mellitus, type 2) (HCC) 01/21/2020   Leukocytosis 01/21/2020   Sepsis (HCC) 01/21/2020   Osteomyelitis of toe of left foot (HCC) 01/21/2020   Osteoarthritis of toe joint, left 01/21/2020   Past Medical History:  Diagnosis Date   Diabetes mellitus, type 2 (HCC)    diet controlled   History of kidney stones    X2 4or 5 yrs ago   Hypertension    controlled  on meds    Family History  Problem Relation Age of Onset   Diabetes Mother    Diabetes Father     Past Surgical History:  Procedure Laterality Date   ABDOMINAL AORTOGRAM W/LOWER EXTREMITY Bilateral 01/28/2020   Procedure: ABDOMINAL AORTOGRAM W/LOWER EXTREMITY;  Surgeon: Maeola Harman, MD;  Location: [facility] INVASIVE CV LAB;  Service: Cardiovascular;  Laterality: Bilateral;   AMPUTATION Left 01/23/2020   Procedure: TRANSMETATARSAL AMPUTATION FOOT;  Surgeon: Nadara Mustard, MD;  Location: [facility] OR;  Service: Orthopedics;  Laterality: Left;   CATARACT EXTRACTION W/PHACO Left 03/19/2019   Procedure: CATARACT EXTRACTION PHACO AND INTRAOCULAR LENS PLACEMENT (IOC)  LEFT DIABETIC symfony lens;  Surgeon: Nevada Crane, MD;  Location: [facility];  Service: Ophthalmology;  Laterality: Left;  Diabetic - oral meds    CATARACT EXTRACTION W/PHACO Right 04/23/2019   Procedure: CATARACT EXTRACTION PHACO AND INTRAOCULAR LENS PLACEMENT (IOC)  RIGHT DIABETIC SYMFONY TORIC LENS;  Surgeon: Nevada Crane, MD;  Location: [facility];  Service: Ophthalmology;  Laterality: Right;  Diabetes-oral med   ROTATOR CUFF REPAIR  10/2015   Social History   Occupational History   Not on file  Tobacco Use   Smoking status: Never   Smokeless tobacco: Never  Vaping Use   Vaping status: Never Used  Substance and Sexual Activity   Alcohol use: Not Currently   Drug use: Never   Sexual activity: Not on file

## 2023-09-07 ENCOUNTER — Encounter: Payer: Self-pay | Admitting: Urology

## 2023-09-08 ENCOUNTER — Other Ambulatory Visit: Payer: Medicare Other

## 2023-09-08 DIAGNOSIS — N210 Calculus in bladder: Secondary | ICD-10-CM

## 2023-09-08 LAB — URINALYSIS, COMPLETE
Bilirubin, UA: NEGATIVE
Glucose, UA: NEGATIVE
Ketones, UA: NEGATIVE
Leukocytes,UA: NEGATIVE
Nitrite, UA: NEGATIVE
Protein,UA: NEGATIVE
Specific Gravity, UA: 1.02 (ref 1.005–1.030)
Urobilinogen, Ur: 1 mg/dL (ref 0.2–1.0)
pH, UA: 5.5 (ref 5.0–7.5)

## 2023-09-08 LAB — MICROSCOPIC EXAMINATION

## 2023-09-09 ENCOUNTER — Other Ambulatory Visit: Payer: Self-pay

## 2023-09-09 ENCOUNTER — Encounter
Admission: RE | Admit: 2023-09-09 | Discharge: 2023-09-09 | Disposition: A | Discharge status: Home / Self Care | Payer: Medicare Other | Source: Ambulatory Visit | Attending: Urology | Admitting: Urology

## 2023-09-09 DIAGNOSIS — E119 Type 2 diabetes mellitus without complications: Secondary | ICD-10-CM

## 2023-09-09 DIAGNOSIS — I10 Essential (primary) hypertension: Secondary | ICD-10-CM

## 2023-09-09 HISTORY — DX: Chronic kidney disease, stage 3 unspecified: N18.30

## 2023-09-09 HISTORY — DX: Elevated white blood cell count, unspecified: D72.829

## 2023-09-09 HISTORY — DX: Chronic kidney disease, unspecified: N18.9

## 2023-09-09 HISTORY — DX: Peripheral vascular disease, unspecified: I73.9

## 2023-09-09 NOTE — Patient Instructions (Addendum)
Your procedure is scheduled on: 09/20/23 - Tuesday Report to the Registration Desk on the 1st floor of the [facility]. To find out your arrival time, please call [phone] between 1PM - 3PM on: 09/19/23 - Monday If your arrival time is 6:00 am, do not arrive before that time as the [facility] entrance doors do not open until 6:00 am.  REMEMBER: Instructions that are not followed completely may result in serious medical risk, up to and including death; or upon the discretion of your surgeon and anesthesiologist your surgery may need to be rescheduled.  Do not eat food or drink any liquids after midnight the night before surgery.  No gum chewing or hard candies.   One week prior to surgery: Stop Anti-inflammatories (NSAIDS) such as Advil, Aleve, Ibuprofen, Motrin, Naproxen, Naprosyn and Aspirin based products such as Excedrin, Goody's Powder, BC Powder. You may take Tylenol if needed for pain up until the day of surgery.  Stop ANY OVER THE COUNTER supplements until after surgery - may continue B12  metFORMIN (GLUCOPHAGE) stop taking on 09/18/23.   Hold Aspirin for 5 days prior to your procedure beginning 09/15/23.   ON THE DAY OF SURGERY ONLY TAKE THESE MEDICATIONS WITH SIPS OF WATER:  metoprolol succinate (TOPROL-XL)  rosuvastatin (CRESTOR)    No Alcohol for 24 hours before or after surgery.  No Smoking including e-cigarettes for 24 hours before surgery.  No chewable tobacco products for at least 6 hours before surgery.  No nicotine patches on the day of surgery.  Do not use any "recreational" drugs for at least a week (preferably 2 weeks) before your surgery.  Please be advised that the combination of cocaine and anesthesia may have negative outcomes, up to and including death. If you test positive for cocaine, your surgery will be cancelled.  On the morning of surgery brush your teeth with toothpaste and water, you may rinse your mouth with mouthwash if you wish. Do  not swallow any toothpaste or mouthwash.  Do not wear jewelry, make-up, hairpins, clips or nail polish.  For welded (permanent) jewelry: bracelets, anklets, waist bands, etc.  Please have this removed prior to surgery.  If it is not removed, there is a chance that hospital personnel will need to cut it off on the day of surgery.  Do not wear lotions, powders, or perfumes.   Do not shave body hair from the neck down 48 hours before surgery.  Contact lenses, hearing aids and dentures may not be worn into surgery.  Do not bring valuables to the hospital. [facility] is not responsible for any missing/lost belongings or valuables.   Notify your doctor if there is any change in your medical condition (cold, fever, infection).  Wear comfortable clothing (specific to your surgery type) to the hospital.  After surgery, you can help prevent lung complications by doing breathing exercises.  Take deep breaths and cough every 1-2 hours. Your doctor may order a device called an Incentive Spirometer to help you take deep breaths. When coughing or sneezing, hold a pillow firmly against your incision with both hands. This is called "splinting." Doing this helps protect your incision. It also decreases belly discomfort.  If you are being admitted to the hospital overnight, leave your suitcase in the car. After surgery it may be brought to your room.  In case of increased patient census, it may be necessary for you, the patient, to continue your postoperative care in the Same Day Surgery department.  If  you are being discharged the day of surgery, you will not be allowed to drive home. You will need a responsible individual to drive you home and stay with you for 24 hours after surgery.   If you are taking public transportation, you will need to have a responsible individual with you.  Please call the Pre-admissions Testing Dept. at [phone] if you have any questions about these  instructions.  Surgery Visitation Policy:  Patients having surgery or a procedure may have two visitors.  Children under the age of 34 must have an adult with them who is not the patient.  Inpatient Visitation:    Visiting hours are 7 a.m. to 8 p.m. Up to four visitors are allowed at one time in a patient room. The visitors may rotate out with other people during the day.  One visitor age 7 or older may stay with the patient overnight and must be in the room by 8 p.m.

## 2023-09-11 LAB — CULTURE, URINE COMPREHENSIVE

## 2023-09-12 ENCOUNTER — Encounter
Admission: RE | Admit: 2023-09-12 | Discharge: 2023-09-12 | Disposition: A | Discharge status: Home / Self Care | Payer: Medicare Other | Source: Ambulatory Visit | Attending: Urology | Admitting: Urology

## 2023-09-12 DIAGNOSIS — Z01810 Encounter for preprocedural cardiovascular examination: Secondary | ICD-10-CM | POA: Insufficient documentation

## 2023-09-12 DIAGNOSIS — I10 Essential (primary) hypertension: Secondary | ICD-10-CM | POA: Diagnosis not present

## 2023-09-19 MED ORDER — CHLORHEXIDINE GLUCONATE 0.12 % MT SOLN
15.0000 mL | Freq: Once | OROMUCOSAL | Status: AC
Start: 2023-09-19 — End: 2023-09-20
  Administered 2023-09-20: 15 mL via OROMUCOSAL

## 2023-09-19 MED ORDER — CEFAZOLIN SODIUM-DEXTROSE 2-4 GM/100ML-% IV SOLN
2.0000 g | INTRAVENOUS | Status: AC
  Administered 2023-09-20: 2 g via INTRAVENOUS

## 2023-09-19 MED ORDER — SODIUM CHLORIDE 0.9 % IV SOLN: INTRAVENOUS | Status: DC

## 2023-09-19 MED ORDER — ORAL CARE MOUTH RINSE: 15.0000 mL | Freq: Once | OROMUCOSAL | Status: AC

## 2023-09-20 ENCOUNTER — Encounter: Payer: Self-pay | Admitting: Urology

## 2023-09-20 ENCOUNTER — Ambulatory Visit
Admission: RE | Admit: 2023-09-20 | Discharge: 2023-09-20 | Disposition: A | Discharge status: Home / Self Care | Payer: Medicare Other | Attending: Urology | Admitting: Urology

## 2023-09-20 ENCOUNTER — Encounter
Admission: RE | Disposition: A | Discharge status: Home / Self Care | Payer: Self-pay | Source: Home / Self Care | Attending: Urology

## 2023-09-20 ENCOUNTER — Other Ambulatory Visit: Payer: Self-pay

## 2023-09-20 ENCOUNTER — Ambulatory Visit: Payer: Medicare Other | Admitting: Anesthesiology

## 2023-09-20 DIAGNOSIS — Z7984 Long term (current) use of oral hypoglycemic drugs: Secondary | ICD-10-CM | POA: Insufficient documentation

## 2023-09-20 DIAGNOSIS — E1151 Type 2 diabetes mellitus with diabetic peripheral angiopathy without gangrene: Secondary | ICD-10-CM | POA: Diagnosis not present

## 2023-09-20 DIAGNOSIS — N400 Enlarged prostate without lower urinary tract symptoms: Secondary | ICD-10-CM | POA: Diagnosis not present

## 2023-09-20 DIAGNOSIS — Z833 Family history of diabetes mellitus: Secondary | ICD-10-CM | POA: Diagnosis not present

## 2023-09-20 DIAGNOSIS — Z87442 Personal history of urinary calculi: Secondary | ICD-10-CM | POA: Diagnosis not present

## 2023-09-20 DIAGNOSIS — E119 Type 2 diabetes mellitus without complications: Secondary | ICD-10-CM

## 2023-09-20 DIAGNOSIS — N210 Calculus in bladder: Secondary | ICD-10-CM | POA: Insufficient documentation

## 2023-09-20 DIAGNOSIS — E1122 Type 2 diabetes mellitus with diabetic chronic kidney disease: Secondary | ICD-10-CM | POA: Diagnosis not present

## 2023-09-20 DIAGNOSIS — N183 Chronic kidney disease, stage 3 unspecified: Secondary | ICD-10-CM | POA: Insufficient documentation

## 2023-09-20 DIAGNOSIS — I129 Hypertensive chronic kidney disease with stage 1 through stage 4 chronic kidney disease, or unspecified chronic kidney disease: Secondary | ICD-10-CM | POA: Insufficient documentation

## 2023-09-20 DIAGNOSIS — Z79899 Other long term (current) drug therapy: Secondary | ICD-10-CM | POA: Diagnosis not present

## 2023-09-20 HISTORY — PX: CYSTOSCOPY WITH LITHOLAPAXY: SHX1425

## 2023-09-20 LAB — GLUCOSE, CAPILLARY
Glucose-Capillary: 154 mg/dL — ABNORMAL HIGH (ref 70–99)
Glucose-Capillary: 166 mg/dL — ABNORMAL HIGH (ref 70–99)

## 2023-09-20 SURGERY — CYSTOSCOPY, WITH BLADDER CALCULUS LITHOLAPAXY
Anesthesia: General

## 2023-09-20 MED ORDER — MIDAZOLAM HCL 2 MG/2ML IJ SOLN
INTRAMUSCULAR | Status: AC
  Filled 2023-09-20: qty 2

## 2023-09-20 MED ORDER — FENTANYL CITRATE (PF) 100 MCG/2ML IJ SOLN
INTRAMUSCULAR | Status: DC | PRN
  Administered 2023-09-20 (×2): 50 ug via INTRAVENOUS

## 2023-09-20 MED ORDER — LIDOCAINE HCL (PF) 1 % IJ SOLN
INTRAMUSCULAR | Status: AC
  Filled 2023-09-20: qty 5

## 2023-09-20 MED ORDER — CEFAZOLIN SODIUM-DEXTROSE 2-4 GM/100ML-% IV SOLN
INTRAVENOUS | Status: AC
  Filled 2023-09-20: qty 100

## 2023-09-20 MED ORDER — PROPOFOL 10 MG/ML IV BOLUS
INTRAVENOUS | Status: AC
  Filled 2023-09-20: qty 20

## 2023-09-20 MED ORDER — FENTANYL CITRATE (PF) 100 MCG/2ML IJ SOLN
INTRAMUSCULAR | Status: AC
  Filled 2023-09-20: qty 2

## 2023-09-20 MED ORDER — BUPIVACAINE LIPOSOME 1.3 % IJ SUSP
INTRAMUSCULAR | Status: AC
  Filled 2023-09-20: qty 20

## 2023-09-20 MED ORDER — EPHEDRINE SULFATE-NACL 50-0.9 MG/10ML-% IV SOSY
PREFILLED_SYRINGE | INTRAVENOUS | Status: DC | PRN
  Administered 2023-09-20: 10 mg via INTRAVENOUS

## 2023-09-20 MED ORDER — OXYCODONE HCL 5 MG/5ML PO SOLN: 5.0000 mg | Freq: Once | ORAL | Status: AC | PRN

## 2023-09-20 MED ORDER — PROPOFOL 10 MG/ML IV BOLUS
INTRAVENOUS | Status: DC | PRN
  Administered 2023-09-20: 200 mg via INTRAVENOUS

## 2023-09-20 MED ORDER — STERILE WATER FOR IRRIGATION IR SOLN
Status: DC | PRN
  Administered 2023-09-20: 6000 mL

## 2023-09-20 MED ORDER — ROCURONIUM BROMIDE 100 MG/10ML IV SOLN
INTRAVENOUS | Status: DC | PRN
  Administered 2023-09-20: 60 mg via INTRAVENOUS

## 2023-09-20 MED ORDER — ROCURONIUM BROMIDE 10 MG/ML (PF) SYRINGE
PREFILLED_SYRINGE | INTRAVENOUS | Status: AC
  Filled 2023-09-20: qty 10

## 2023-09-20 MED ORDER — DEXAMETHASONE SODIUM PHOSPHATE 10 MG/ML IJ SOLN
INTRAMUSCULAR | Status: AC
  Filled 2023-09-20: qty 1

## 2023-09-20 MED ORDER — ONDANSETRON HCL 4 MG/2ML IJ SOLN
INTRAMUSCULAR | Status: DC | PRN
  Administered 2023-09-20: 4 mg via INTRAVENOUS

## 2023-09-20 MED ORDER — PHENYLEPHRINE 80 MCG/ML (10ML) SYRINGE FOR IV PUSH (FOR BLOOD PRESSURE SUPPORT)
PREFILLED_SYRINGE | INTRAVENOUS | Status: DC | PRN
  Administered 2023-09-20: 80 ug via INTRAVENOUS

## 2023-09-20 MED ORDER — ONDANSETRON HCL 4 MG/2ML IJ SOLN
INTRAMUSCULAR | Status: AC
  Filled 2023-09-20: qty 2

## 2023-09-20 MED ORDER — PHENAZOPYRIDINE HCL 200 MG PO TABS
200.0000 mg | ORAL_TABLET | Freq: Three times a day (TID) | ORAL | 0 refills | Status: DC | PRN
Start: 2023-09-20 — End: 2024-01-06

## 2023-09-20 MED ORDER — MIDAZOLAM HCL 2 MG/2ML IJ SOLN
INTRAMUSCULAR | Status: DC | PRN
  Administered 2023-09-20: 2 mg via INTRAVENOUS

## 2023-09-20 MED ORDER — SUGAMMADEX SODIUM 200 MG/2ML IV SOLN
INTRAVENOUS | Status: DC | PRN
  Administered 2023-09-20: 217.8 mg via INTRAVENOUS

## 2023-09-20 MED ORDER — LIDOCAINE HCL (CARDIAC) PF 100 MG/5ML IV SOSY
PREFILLED_SYRINGE | INTRAVENOUS | Status: DC | PRN
  Administered 2023-09-20: 100 mg via INTRAVENOUS

## 2023-09-20 MED ORDER — OXYCODONE HCL 5 MG PO TABS
ORAL_TABLET | ORAL | Status: AC
  Filled 2023-09-20: qty 1

## 2023-09-20 MED ORDER — FENTANYL CITRATE (PF) 100 MCG/2ML IJ SOLN: 25.0000 ug | INTRAMUSCULAR | Status: DC | PRN

## 2023-09-20 MED ORDER — DEXAMETHASONE SODIUM PHOSPHATE 10 MG/ML IJ SOLN
INTRAMUSCULAR | Status: DC | PRN
  Administered 2023-09-20: 10 mg via INTRAVENOUS

## 2023-09-20 MED ORDER — OXYCODONE HCL 5 MG PO TABS
5.0000 mg | ORAL_TABLET | Freq: Once | ORAL | Status: AC | PRN
  Administered 2023-09-20: 5 mg via ORAL

## 2023-09-20 MED ORDER — CHLORHEXIDINE GLUCONATE 0.12 % MT SOLN
OROMUCOSAL | Status: AC
  Filled 2023-09-20: qty 15

## 2023-09-20 SURGICAL SUPPLY — 19 items
BAG DRAIN SIEMENS DORNER NS (MISCELLANEOUS) ×2 IMPLANT
BAG URINE DRAIN 2000ML AR STRL (UROLOGICAL SUPPLIES) IMPLANT
BASKET ZERO TIP 1.9FR (BASKET) IMPLANT
CATH FOL 2WAY LX 18X30 (CATHETERS) IMPLANT
CATH URETL OPEN END 4X70 (CATHETERS) IMPLANT
FIBER LASER MOSES 200 DFL (Laser) IMPLANT
FIBER LASER MOSES 550 DFL (Laser) IMPLANT
GLOVE BIOGEL PI IND STRL 7.5 (GLOVE) ×2 IMPLANT
GOWN STRL REUS W/ TWL LRG LVL3 (GOWN DISPOSABLE) ×4 IMPLANT
GOWN STRL REUS W/ TWL XL LVL3 (GOWN DISPOSABLE) ×2 IMPLANT
IV NS IRRIG 3000ML ARTHROMATIC (IV SOLUTION) ×4 IMPLANT
KIT TURNOVER CYSTO (KITS) ×2 IMPLANT
PACK CYSTO AR (MISCELLANEOUS) ×2 IMPLANT
SET IRRIG Y TYPE TUR BLADDER L (SET/KITS/TRAYS/PACK) ×2 IMPLANT
SURGILUBE 2OZ TUBE FLIPTOP (MISCELLANEOUS) ×2 IMPLANT
SYR TOOMEY IRRIG 70ML (MISCELLANEOUS) ×1
SYRINGE TOOMEY IRRIG 70ML (MISCELLANEOUS) ×2 IMPLANT
WATER STERILE IRR 1000ML POUR (IV SOLUTION) ×2 IMPLANT
WATER STERILE IRR 3000ML UROMA (IV SOLUTION) IMPLANT

## 2023-09-20 NOTE — Anesthesia Procedure Notes (Signed)
Procedure Name: Intubation Date/Time: 09/20/2023 8:55 AM  Performed by: Stormy Fabian, CRNAPre-anesthesia Checklist: Patient identified, Patient being monitored, Timeout performed, Emergency Drugs available and Suction available Patient Re-evaluated:Patient Re-evaluated prior to induction Oxygen Delivery Method: Circle system utilized Preoxygenation: Pre-oxygenation with 100% oxygen Induction Type: IV induction Ventilation: Mask ventilation without difficulty Laryngoscope Size: Mac, McGrath and 4 Grade View: Grade I Tube type: Oral Tube size: 7.5 mm Number of attempts: 1 Airway Equipment and Method: Stylet Placement Confirmation: ETT inserted through vocal cords under direct vision, positive ETCO2 and breath sounds checked- equal and bilateral Secured at: 22 cm Tube secured with: Tape Dental Injury: Teeth and Oropharynx as per pre-operative assessment

## 2023-09-20 NOTE — Anesthesia Postprocedure Evaluation (Signed)
Anesthesia Post Note  Patient: YSABEL EMMINGER  Procedure(s) Performed: CYSTOSCOPY WITH LITHOLAPAXY  Patient location during evaluation: PACU Anesthesia Type: General Level of consciousness: awake and alert Pain management: pain level controlled Vital Signs Assessment: post-procedure vital signs reviewed and stable Respiratory status: spontaneous breathing, nonlabored ventilation, respiratory function stable and patient connected to nasal cannula oxygen Cardiovascular status: blood pressure returned to baseline and stable Postop Assessment: no apparent nausea or vomiting Anesthetic complications: no   No notable events documented.   Last Vitals:  Vitals:   09/20/23 1000 09/20/23 1026  BP: (!) 143/81 (!) 158/87  Pulse: 71   Resp: 16   Temp:    SpO2: 95% 95%    Last Pain:  Vitals:   09/20/23 1026  TempSrc:   PainSc: 0-No pain                 Louie Boston

## 2023-09-20 NOTE — Anesthesia Preprocedure Evaluation (Signed)
Anesthesia Evaluation  Patient identified by MRN, date of birth, ID band Patient awake    Reviewed: Allergy & Precautions, NPO status , Patient's Chart, lab work & pertinent test results  History of Anesthesia Complications Negative for: history of anesthetic complications  Airway Mallampati: III  TM Distance: >3 FB Neck ROM: full    Dental no notable dental hx.    Pulmonary neg pulmonary ROS   Pulmonary exam normal        Cardiovascular hypertension, On Medications + Peripheral Vascular Disease  Normal cardiovascular exam     Neuro/Psych negative neurological ROS  negative psych ROS   GI/Hepatic negative GI ROS, Neg liver ROS,,,  Endo/Other  negative endocrine ROSdiabetes, Type 2, Oral Hypoglycemic Agents    Renal/GU Renal disease     Musculoskeletal   Abdominal   Peds  Hematology negative hematology ROS (+)   Anesthesia Other Findings Past Medical History: 2024: Abscess of left lower leg No date: Chronic kidney disease No date: CKD (chronic kidney disease) stage 3, GFR 30-59 ml/min (HCC) No date: Diabetes mellitus, type 2 (HCC)     Comment:  diet controlled No date: History of kidney stones     Comment:  X2 4or 5 yrs ago No date: Hypertension     Comment:  controlled on meds No date: Leukocytosis No date: Peripheral vascular disease (HCC)  Past Surgical History: 01/28/2020: ABDOMINAL AORTOGRAM W/LOWER EXTREMITY; Bilateral     Comment:  Procedure: ABDOMINAL AORTOGRAM W/LOWER EXTREMITY;                Surgeon: Maeola Harman, MD;  Location: [facility] [address]               INVASIVE CV LAB;  Service: Cardiovascular;  Laterality:               Bilateral; 01/23/2020: AMPUTATION; Left     Comment:  Procedure: TRANSMETATARSAL AMPUTATION FOOT;  Surgeon:               Nadara Mustard, MD;  Location: [facility];  Service:               Orthopedics;  Laterality: Left; 03/19/2019: CATARACT EXTRACTION W/PHACO; Left      Comment:  Procedure: CATARACT EXTRACTION PHACO AND INTRAOCULAR               LENS PLACEMENT (IOC)  LEFT DIABETIC symfony lens;                Surgeon: Nevada Crane, MD;  Location: [facility]               SURGERY CNTR;  Service: Ophthalmology;  Laterality: Left;              Diabetic - oral meds 04/23/2019: CATARACT EXTRACTION W/PHACO; Right     Comment:  Procedure: CATARACT EXTRACTION PHACO AND INTRAOCULAR               LENS PLACEMENT (IOC)  RIGHT DIABETIC SYMFONY TORIC LENS;               Surgeon: Nevada Crane, MD;  Location: [facility]               SURGERY CNTR;  Service: Ophthalmology;  Laterality:               Right;  Diabetes-oral med No date: MENISCUS REPAIR 10/2015: ROTATOR CUFF REPAIR  BMI    Body Mass Index: 31.66 kg/m      Reproductive/Obstetrics negative OB  ROS                             Anesthesia Physical Anesthesia Plan  ASA: 3  Anesthesia Plan: General ETT   Post-op Pain Management: Ofirmev IV (intra-op)*   Induction: Intravenous  PONV Risk Score and Plan: 2 and Ondansetron, Dexamethasone, Midazolam and Treatment may vary due to age or medical condition  Airway Management Planned: Oral ETT  Additional Equipment:   Intra-op Plan:   Post-operative Plan: Extubation in OR  Informed Consent: I have reviewed the patients History and Physical, chart, labs and discussed the procedure including the risks, benefits and alternatives for the proposed anesthesia with the patient or authorized representative who has indicated his/her understanding and acceptance.     Dental Advisory Given  Plan Discussed with: Anesthesiologist, CRNA and Surgeon  Anesthesia Plan Comments: (Patient consented for risks of anesthesia including but not limited to:  - adverse reactions to medications - damage to eyes, teeth, lips or other oral mucosa - nerve damage due to positioning  - sore throat or hoarseness - Damage to heart, brain, nerves,  lungs, other parts of body or loss of life  Patient voiced understanding and assent.)       Anesthesia Quick Evaluation

## 2023-09-20 NOTE — Discharge Instructions (Signed)
Cystoscopy patient instructions  Following a cystoscopy, a catheter (a flexible rubber tube) is sometimes left in place to empty the bladder. This may cause some discomfort or a feeling that you need to urinate. Your doctor determines the period of time that the catheter will be left in place. You may have bloody urine for two to three days (Call your doctor if the amount of bleeding increases or does not subside).  You may pass blood clots in your urine, especially if you had a biopsy. It is not unusual to pass small blood clots and have some bloody urine a couple of weeks after your cystoscopy. Again, call your doctor if the bleeding does not subside. You may have: Dysuria (painful urination) Frequency (urinating often) Urgency (strong desire to urinate)  These symptoms are common especially if medicine is instilled into the bladder or a ureteral stent is placed. Avoiding alcohol and caffeine, such as coffee, tea, and chocolate, may help relieve these symptoms. Drink plenty of water, unless otherwise instructed. Your doctor may also prescribe an antibiotic or other medicine to reduce these symptoms.  Cystoscopy results are available soon after the procedure; biopsy results usually take two to four days. Your doctor will discuss the results of your exam with you. Before you go home, you will be given specific instructions for follow-up care. Special Instructions:   If you are going home with a catheter in place do not take a tub bath until removed by your doctor.   You may resume your normal activities.   Do not drive or operate machinery if you are taking narcotic pain medicine.   Be sure to keep all follow-up appointments with your doctor.   Call Your Doctor If: The catheter is not draining You have severe pain You are unable to urinate You have a fever over 101 You have severe bleeding  6  Rx Pyridium sent to pharmacy if needed.  This medication will help with burning with  urination

## 2023-09-20 NOTE — H&P (Signed)
Urology H&P   History of Present Illness: Brent Lam is a 67 y.o. male with a 15 mm bladder calculus who presents for cystolitholapaxy.  He was initially seen June 2024 and had to delay surgery for several months due to his work schedule.  He has had no problems since his last visit.  No bothersome LUTS and he declined an outlet procedure  Past Medical History:  Diagnosis Date   Abscess of left lower leg 2024   Chronic kidney disease    CKD (chronic kidney disease) stage 3, GFR 30-59 ml/min (HCC)    Diabetes mellitus, type 2 (HCC)    diet controlled   History of kidney stones    X2 4or 5 yrs ago   Hypertension    controlled on meds   Leukocytosis    Peripheral vascular disease (HCC)     Past Surgical History:  Procedure Laterality Date   ABDOMINAL AORTOGRAM W/LOWER EXTREMITY Bilateral 01/28/2020   Procedure: ABDOMINAL AORTOGRAM W/LOWER EXTREMITY;  Surgeon: Maeola Harman, MD;  Location: [facility] INVASIVE CV LAB;  Service: Cardiovascular;  Laterality: Bilateral;   AMPUTATION Left 01/23/2020   Procedure: TRANSMETATARSAL AMPUTATION FOOT;  Surgeon: Nadara Mustard, MD;  Location: [facility] OR;  Service: Orthopedics;  Laterality: Left;   CATARACT EXTRACTION W/PHACO Left 03/19/2019   Procedure: CATARACT EXTRACTION PHACO AND INTRAOCULAR LENS PLACEMENT (IOC)  LEFT DIABETIC symfony lens;  Surgeon: Nevada Crane, MD;  Location: [facility] SURGERY CNTR;  Service: Ophthalmology;  Laterality: Left;  Diabetic - oral meds   CATARACT EXTRACTION W/PHACO Right 04/23/2019   Procedure: CATARACT EXTRACTION PHACO AND INTRAOCULAR LENS PLACEMENT (IOC)  RIGHT DIABETIC SYMFONY TORIC LENS;  Surgeon: Nevada Crane, MD;  Location: [facility] SURGERY CNTR;  Service: Ophthalmology;  Laterality: Right;  Diabetes-oral med   MENISCUS REPAIR     ROTATOR CUFF REPAIR  10/2015    Home Medications:  Current Meds  Medication Sig   aspirin EC 81 MG tablet Take 81 mg by mouth See admin instructions. Take  Sunday-Wednesday Swallow whole.   Cyanocobalamin (VITAMIN B-12) 5000 MCG SUBL Place 5,000 mcg under the tongue daily.   glipiZIDE (GLUCOTROL XL) 5 MG 24 hr tablet Take 5 mg by mouth daily.   metFORMIN (GLUCOPHAGE) 500 MG tablet Take 500 mg by mouth 2 (two) times daily with a meal.   metoprolol succinate (TOPROL-XL) 50 MG 24 hr tablet Take 50 mg by mouth daily.   nitroGLYCERIN (NITRODUR - DOSED IN MG/24 HR) 0.2 mg/hr patch PLACE 1 PATCH (0.2 MG TOTAL) ONTO THE SKIN DAILY. (Patient taking differently: Place 0.2 mg onto the skin as needed.)   pioglitazone (ACTOS) 30 MG tablet Take 30 mg by mouth daily.   rosuvastatin (CRESTOR) 5 MG tablet Take 5 mg by mouth daily.    Allergies: No Known Allergies  Family History  Problem Relation Age of Onset   Diabetes Mother    Diabetes Father     Social History:  reports that he has never smoked. He has never used smokeless tobacco. He reports that he does not currently use alcohol. He reports that he does not use drugs.  ROS: Noncontributory except as per the HPI  Physical Exam:  Vital signs in last 24 hours: Temp:  [97.4 F (36.3 C)] 97.4 F (36.3 C) (12/17 0705) Pulse Rate:  [84] 84 (12/17 0705) Resp:  [18] 18 (12/17 0705) BP: (172-189)/(96) 172/96 (12/17 0715) SpO2:  [94 %] 94 % (12/17 0705) Weight:  [108.9 kg] 108.9 kg (12/17 0705)  Constitutional:  Alert and oriented, No acute distress HEENT: [place] AT Psychiatric: Normal mood and affect   Laboratory Data:  No results for input(s): "WBC", "HGB", "HCT" in the last 72 hours. No results for input(s): "NA", "K", "CL", "CO2", "GLUCOSE", "BUN", "CREATININE", "CALCIUM" in the last 72 hours. No results for input(s): "LABPT", "INR" in the last 72 hours. No results for input(s): "LABURIN" in the last 72 hours. Results for orders placed or performed in visit on 09/08/23  CULTURE, URINE COMPREHENSIVE     Status: None   Collection Time: 09/08/23  8:28 AM   Specimen: Urine   UR  Result Value Ref  Range Status   Urine Culture, Comprehensive Final report  Final   Organism ID, Bacteria Comment  Final    Comment: Mixed urogenital flora 5,000  Colonies/mL   Microscopic Examination     Status: Abnormal   Collection Time: 09/08/23  8:28 AM   Urine  Result Value Ref Range Status   WBC, UA 0-5 0 - 5 /hpf Final   RBC, Urine 3-10 (A) 0 - 2 /hpf Final   Epithelial Cells (non renal) 0-10 0 - 10 /hpf Final   Mucus, UA Present (A) Not Estab. Final   Bacteria, UA Few None seen/Few Final     Impression/Assessment:  Bladder calculus  Plan:  Cystolitholapaxy.  The procedure has been discussed in detail.  All questions were answered and he desires to proceed    09/20/2023, 8:37 AM  Irineo Axon,  MD

## 2023-09-20 NOTE — Op Note (Signed)
   Preoperative diagnosis:  Bladder calculus (< 2.5 cm)  Postoperative diagnosis:  Same  Procedure: Cystolitholapaxy  Surgeon: Riki Altes, MD  Anesthesia: General  Complications: None  Intraoperative findings:  15 mm bladder calculus Urethra normal caliber without stricture Prostate moderate lateral lobe enlargement with bladder neck elevation Moderate bladder trabeculation.  No solid or papillary lesions  EBL: Minimal  Specimens: None  Indication: Brent Lam is a 67 y.o. male with a 15 mm bladder calculus incidentally identified on CT.  After reviewing the management options for treatment, he elected to proceed with the above surgical procedure(s). We have discussed the potential benefits and risks of the procedure, side effects of the proposed treatment, the likelihood of the patient achieving the goals of the procedure, and any potential problems that might occur during the procedure or recuperation. Informed consent has been obtained.  Description of procedure:  The patient was taken to the operating room and general anesthesia was induced.  The patient was placed in the dorsal lithotomy position, prepped and draped in the usual sterile fashion, and preoperative antibiotics were administered. A preoperative time-out was performed.   The urethral meatus would not except a 26 French continuous-flow resectoscope sheath with obturator into the distal urethra was dilated with [name] sounds from 18-30 F.  The resectoscope sheath with obturator was then easily advanced into the bladder.  A laser bridge was then placed into the sheath.  Panendoscopy was performed with findings as described above.  The calculus was then dusted with a 500 m holmium laser fiber at a setting of 0.3J/120 Hz.  All particles were then irrigated from the bladder.  Post procedure cystoscopy was performed with the findings of remaining dust like particles but no significant fragments.  The bladder was  emptied and the resectoscope sheath was removed.  After anesthetic reversal he was transported to the PACU in stable condition.  Plan: Postop follow-up scheduled ~ 1 month   Irineo Axon, MD

## 2023-09-20 NOTE — Transfer of Care (Signed)
Immediate Anesthesia Transfer of Care Note  Patient: Brent Lam  Procedure(s) Performed: CYSTOSCOPY WITH LITHOLAPAXY  Patient Location: PACU  Anesthesia Type:General  Level of Consciousness: drowsy and patient cooperative  Airway & Oxygen Therapy: Patient Spontanous Breathing and Patient connected to face mask oxygen  Post-op Assessment: Report given to RN and Post -op Vital signs reviewed and stable  Post vital signs: stable  Last Vitals:  Vitals Value Taken Time  BP 173/88 09/20/23 0938  Temp    Pulse 73 09/20/23 0941  Resp 17 09/20/23 0941  SpO2 100 % 09/20/23 0941  Vitals shown include unfiled device data.  Last Pain:  Vitals:   09/20/23 0705  TempSrc: Temporal  PainSc: 0-No pain         Complications: No notable events documented.

## 2023-09-21 ENCOUNTER — Encounter: Payer: Self-pay | Admitting: Urology

## 2023-09-21 ENCOUNTER — Ambulatory Visit: Payer: Medicare Other | Admitting: Urology

## 2023-09-21 DIAGNOSIS — N200 Calculus of kidney: Secondary | ICD-10-CM | POA: Diagnosis not present

## 2023-09-21 DIAGNOSIS — N401 Enlarged prostate with lower urinary tract symptoms: Secondary | ICD-10-CM | POA: Diagnosis not present

## 2023-09-21 DIAGNOSIS — N210 Calculus in bladder: Secondary | ICD-10-CM

## 2023-09-21 DIAGNOSIS — R339 Retention of urine, unspecified: Secondary | ICD-10-CM | POA: Diagnosis not present

## 2023-09-21 DIAGNOSIS — Z87442 Personal history of urinary calculi: Secondary | ICD-10-CM

## 2023-09-21 LAB — BLADDER SCAN AMB NON-IMAGING: PVR: 628 WU

## 2023-09-21 MED ORDER — TAMSULOSIN HCL 0.4 MG PO CAPS: 0.4000 mg | ORAL_CAPSULE | Freq: Every day | ORAL | 3 refills | Status: DC

## 2023-09-21 NOTE — Patient Instructions (Signed)

## 2023-09-21 NOTE — Progress Notes (Addendum)
09/21/2023 2:24 PM   Brent Lam July 01, 1956 [id number]  Referring provider: Danella Penton, MD [phone] [facility] [address] [facility] [place],  [place] [address]  Urological history: 1.  Nephrolithiasis -non-contrast CT (02/2023) -bilateral renal calculi  2.  Bladder stone -Noncontrast CT (02/2023) - 1.5 cm bladder stone -cystolitholapaxy (09/21/2023)   3. BPH with LU TS -PSA (12/2022) 0.4  -cysto (09/21/2023) moderate lateral lobe enlargement with bladder neck elevation, moderate bladder trabeculation  No chief complaint on file.  HPI: Brent Lam is a 67 y.o. male who presents today for inability to urinate with his SO, Brent Lam.   Previous records reviewed.   Patient has been unable to urinate since early this a.m.  He has been forcing fluids to see if he can facilitate bladder emptying, but he is only dribbling.  Patient denies any modifying or aggravating factors.  Patient denies any recent UTI's, gross hematuria, dysuria or suprapubic/flank pain.  Patient denies any fevers, chills, nausea or vomiting.    PVR 628 mL   PMH: Past Medical History:  Diagnosis Date   Abscess of left lower leg 2024   Chronic kidney disease    CKD (chronic kidney disease) stage 3, GFR 30-59 ml/min (HCC)    Diabetes mellitus, type 2 (HCC)    diet controlled   History of kidney stones    X2 4or 5 yrs ago   Hypertension    controlled on meds   Leukocytosis    Peripheral vascular disease (HCC)     Surgical History: Past Surgical History:  Procedure Laterality Date   ABDOMINAL AORTOGRAM W/LOWER EXTREMITY Bilateral 01/28/2020   Procedure: ABDOMINAL AORTOGRAM W/LOWER EXTREMITY;  Surgeon: Maeola Harman, MD;  Location: [facility] INVASIVE CV LAB;  Service: Cardiovascular;  Laterality: Bilateral;   AMPUTATION Left 01/23/2020   Procedure: TRANSMETATARSAL AMPUTATION FOOT;  Surgeon: Nadara Mustard, MD;  Location: [facility] OR;  Service: Orthopedics;  Laterality: Left;   CATARACT  EXTRACTION W/PHACO Left 03/19/2019   Procedure: CATARACT EXTRACTION PHACO AND INTRAOCULAR LENS PLACEMENT (IOC)  LEFT DIABETIC symfony lens;  Surgeon: Nevada Crane, MD;  Location: [facility];  Service: Ophthalmology;  Laterality: Left;  Diabetic - oral meds   CATARACT EXTRACTION W/PHACO Right 04/23/2019   Procedure: CATARACT EXTRACTION PHACO AND INTRAOCULAR LENS PLACEMENT (IOC)  RIGHT DIABETIC SYMFONY TORIC LENS;  Surgeon: Nevada Crane, MD;  Location: [facility];  Service: Ophthalmology;  Laterality: Right;  Diabetes-oral med   CYSTOSCOPY WITH LITHOLAPAXY N/A 09/20/2023   Procedure: CYSTOSCOPY WITH LITHOLAPAXY;  Surgeon: Riki Altes, MD;  Location: [facility] ORS;  Service: Urology;  Laterality: N/A;   MENISCUS REPAIR     ROTATOR CUFF REPAIR  10/2015    Home Medications:  Allergies as of 09/21/2023   No Known Allergies      Medication List        Accurate as of September 21, 2023  2:24 PM. If you have any questions, ask your nurse or doctor.          aspirin EC 81 MG tablet Take 81 mg by mouth See admin instructions. Take Sunday-Wednesday Swallow whole.   glipiZIDE 5 MG 24 hr tablet Commonly known as: GLUCOTROL XL Take 5 mg by mouth daily.   metFORMIN 500 MG tablet Commonly known as: GLUCOPHAGE Take 500 mg by mouth 2 (two) times daily with a meal.   metoprolol succinate 50 MG 24 hr tablet Commonly known as: TOPROL-XL Take 50 mg by mouth daily.  nitroGLYCERIN 0.2 mg/hr patch Commonly known as: NITRODUR - Dosed in mg/24 hr PLACE 1 PATCH (0.2 MG TOTAL) ONTO THE SKIN DAILY. What changed:  when to take this reasons to take this   phenazopyridine 200 MG tablet Commonly known as: PYRIDIUM Take 1 tablet (200 mg total) by mouth 3 (three) times daily as needed (burning with urination).   pioglitazone 30 MG tablet Commonly known as: ACTOS Take 30 mg by mouth daily.   rosuvastatin 5 MG tablet Commonly known as: CRESTOR Take 5 mg by mouth  daily.   tamsulosin 0.4 MG Caps capsule Commonly known as: FLOMAX Take 1 capsule (0.4 mg total) by mouth daily. Started by: Michiel Cowboy   Vitamin B-12 5000 MCG Subl Place 5,000 mcg under the tongue daily.        Allergies: No Known Allergies  Family History: Family History  Problem Relation Age of Onset   Diabetes Mother    Diabetes Father     Social History:  reports that he has never smoked. He has never used smokeless tobacco. He reports that he does not currently use alcohol. He reports that he does not use drugs.  ROS: Pertinent ROS in HPI  Physical Exam: Constitutional:  Well nourished. Alert and oriented, No acute distress. HEENT: [place] AT, moist mucus membranes.  Trachea midline Cardiovascular: No clubbing, cyanosis, or edema. Respiratory: Normal respiratory effort, no increased work of breathing. GU: No CVA tenderness.  No bladder fullness or masses.  Patient with circumcised phallus.  Urethral meatus is patent.  No penile discharge. No penile lesions or rashes. Scrotum without lesions, cysts, rashes and/or edema.   Neurologic: Grossly intact, no focal deficits, moving all 4 extremities. Psychiatric: Normal mood and affect.  Laboratory Data: CBC w/auto Differential (5 Part) Order: [id number] Component Ref Range & Units 2 mo ago  WBC (White Blood Cell Count) 4.1 - 10.2 10^3/uL 8.2  RBC (Red Blood Cell Count) 4.69 - 6.13 10^6/uL 4.98  Hemoglobin 14.1 - 18.1 gm/dL 40.9  Hematocrit 81.1 - 52.0 % 46.9  MCV (Mean Corpuscular Volume) 80.0 - 100.0 fl 94.2  MCH (Mean Corpuscular Hemoglobin) 27.0 - 31.2 pg 31.3 High   MCHC (Mean Corpuscular Hemoglobin Concentration) 32.0 - 36.0 gm/dL 91.4  Platelet Count 782 - 450 10^3/uL 186  RDW-CV (Red Cell Distribution Width) 11.6 - 14.8 % 14.2  MPV (Mean Platelet Volume) 9.4 - 12.4 fl 9 Low   Neutrophils 1.50 - 7.80 10^3/uL 5.5  Lymphocytes 1.00 - 3.60 10^3/uL 1.58  Monocytes 0.00 - 1.50 10^3/uL 0.71   Eosinophils 0.00 - 0.55 10^3/uL 0.19  Basophils 0.00 - 0.09 10^3/uL 0.09  Neutrophil % 32.0 - 70.0 % 67.4  Lymphocyte % 10.0 - 50.0 % 19.3  Monocyte % 4.0 - 13.0 % 8.7  Eosinophil % 1.0 - 5.0 % 2.3  Basophil% 0.0 - 2.0 % 1.1  Immature Granulocyte % <=0.7 % 1.2 High   Immature Granulocyte Count <=0.06 10^3/L 0.1 High   Resulting Agency [facility]   Specimen Collected: 07/12/23 07:14   Performed by: [facility] Last Resulted: 07/12/23 08:38  Received From: [facility]  Result Received: 08/11/23 13:11   Comprehensive Metabolic Panel (CMP) Order: [id number] Component Ref Range & Units 2 mo ago  Glucose 70 - 110 mg/dL 578 High   Sodium 469 - 145 mmol/L 138  Potassium 3.6 - 5.1 mmol/L 4.4  Chloride 97 - 109 mmol/L 102  Carbon Dioxide (CO2) 22.0 - 32.0 mmol/L 28.7  Urea Nitrogen (BUN) 7 - 25 mg/dL 20  Creatinine 0.7 - 1.3 mg/dL 1.3  Glomerular Filtration Rate (eGFR) >60 mL/min/1.73sq m 60 Low   Comment: CKD-EPI (2021) does not include patient's race in the calculation of eGFR.  Monitoring changes of plasma creatinine and eGFR over time is useful for monitoring kidney function.  Interpretive Ranges for eGFR (CKD-EPI 2021):  eGFR:       >60 mL/min/1.73 sq. m - Normal eGFR:       30-59 mL/min/1.73 sq. m - Moderately Decreased eGFR:       15-29 mL/min/1.73 sq. m  - Severely Decreased eGFR:       < 15 mL/min/1.73 sq. m  - Kidney Failure   Note: These eGFR calculations do not apply in acute situations when eGFR is changing rapidly or patients on dialysis.  Calcium 8.7 - 10.3 mg/dL 9.2  AST 8 - 39 U/L 13  ALT 6 - 57 U/L 12  Alk Phos (alkaline Phosphatase) 34 - 104 U/L 83  Albumin 3.5 - 4.8 g/dL 4.3  Bilirubin, Total 0.3 - 1.2 mg/dL 0.6  Protein, Total 6.1 - 7.9 g/dL 6.9  A/G Ratio 1.0 - 5.0 gm/dL 1.7  Resulting Agency [facility]   Specimen Collected: 07/12/23 07:14   Performed by: [facility]  [facility] Last Resulted: 07/12/23 13:43  Received From: [facility]  Result Received: 08/11/23 13:11   Hemoglobin A1C Order: [id number] Component Ref Range & Units 2 mo ago  Hemoglobin A1C 4.2 - 5.6 % 8.6 High   Average Blood Glucose (Calc) mg/dL 469  Resulting Agency [facility]  Narrative Performed by [facility] Normal Range:    4.2 - 5.6% Increased Risk:  5.7 - 6.4% Diabetes:        >= 6.5% Glycemic Control for adults with diabetes:  <7%    Specimen Collected: 07/12/23 07:14   Performed by: [facility] Last Resulted: 07/12/23 10:27  Received From: [facility]  Result Received: 08/11/23 13:11   Lipid Panel w/calc LDL Order: [id number] Component Ref Range & Units 2 mo ago  Cholesterol, Total 100 - 200 mg/dL 244  Triglyceride 35 - 199 mg/dL 010 High   HDL (High Density Lipoprotein) Cholesterol 29.0 - 71.0 mg/dL 27.2  LDL Calculated 0 - 130 mg/dL 57  VLDL Cholesterol mg/dL 56  Cholesterol/HDL Ratio 4.4  Resulting Agency [facility]   Specimen Collected: 07/12/23 07:14   Performed by: [facility] Last Resulted: 07/12/23 13:43  Received From: [facility]  Result Received: 08/11/23 13:11   Urinalysis See EPIC and HPI  I have reviewed the labs.   Pertinent Imaging: Bladder Scan (Post Void Residual) in office Order: [id number]  Status: Final result     Next appt: 10/21/2023 at 09:30 AM in Urology Riki Altes, MD)     Dx: Urine retention   Test Result Released: No (scheduled for 09/21/2023  2:52 PM)   0 Result Notes    Component Ref Range & Units (hover) 13:52  PVR 628.0            Simple Catheter Placement Due to urinary retention patient is present today for a foley cath placement.  Patient was cleaned and prepped in a sterile fashion with betadine and 2% lidocaine jelly was instilled into the urethra. A 18 FR  Coude foley catheter was inserted, urine return was  noted  600 ml, urine was orange, clear in color.  The balloon was filled with 10cc of sterile water.  A leg bag was attached for drainage. Patient was also given a night bag to take home and was given instruction on how to change from one bag to another.  Patient was given instruction on proper catheter care.  Patient tolerated well, no complications were noted   Performed by: Michiel Cowboy, PA-C and [place] [facility], CMA   Assessment & Plan:    1. BPH with retention -Foley replaced with a return of 600 cc of urine, orange clear  -Patient will return in 1 week for a voiding trial-patient significant other is comfortable with taking out the Foley Thursday morning and then returning Thursday afternoon for follow-up PVR -I started him on the tamsulosin 0.4 mg daily  2. Bladder stone -treated with cystolitholapaxy yesterday   3. Nephrolithiasis -bilateral nephrolithiasis -Asymptomatic at this visit  Return in about 1 week (around 09/28/2023) for TOV .  These notes generated with voice recognition software. I apologize for typographical errors.  Cloretta Ned  [facility] [address]  [address] [place], [place] [address] [phone]

## 2023-09-29 ENCOUNTER — Encounter: Payer: Self-pay | Admitting: Urology

## 2023-09-29 ENCOUNTER — Ambulatory Visit: Payer: Medicare Other | Admitting: Urology

## 2023-09-29 VITALS — BP 158/87 | HR 87

## 2023-09-29 DIAGNOSIS — R339 Retention of urine, unspecified: Secondary | ICD-10-CM

## 2023-09-29 DIAGNOSIS — N138 Other obstructive and reflux uropathy: Secondary | ICD-10-CM

## 2023-09-29 DIAGNOSIS — N401 Enlarged prostate with lower urinary tract symptoms: Secondary | ICD-10-CM

## 2023-09-29 LAB — BLADDER SCAN AMB NON-IMAGING: Scan Result: 177

## 2023-09-29 NOTE — Progress Notes (Signed)
I, Brent Lam, acting as a scribe for Riki Altes, MD., have documented all relevant documentation on the behalf of Riki Altes, MD, as directed by Riki Altes, MD while in the presence of Riki Altes, MD.  09/29/2023 4:12 PM   Brent Lam October 29, 1955 [id number]  Referring provider: Danella Penton, MD [phone] [facility] [address] [facility] [place],  [place] [address]  Chief Complaint  Patient presents with   Follow-up   1. Bladder calculus Cystolitholapaxy 09/20/23 15 mm bladder calculus  2. Bilateral nephrolithiasis Nonobstructing renal calculi  3.  BPH with LUTS Postop urinary retention Tamsulosin 0.4 mg  HPI: Brent Lam is a 67 y.o. male presents for follow-up visit.   Status post-cystolitholapaxy of a 15 mm bladder calculus 09/20/23. Was noted to have moderate prostatic enlargement with bladder neck elevation on cystoscopy. Preoperatively, he declined outlet procedure. He was discharged home without a catheter and was seen in our office the following morning in acute urinary retention and a Foley catheter was placed. He was started on tamsulosin.  He removed his catheter this morning and presents today for a follow-up bladder scan.  He denies bothersome voiding symptoms and feels he is at baseline.  PVR today 177 mL   PMH: Past Medical History:  Diagnosis Date   Abscess of left lower leg 2024   Chronic kidney disease    CKD (chronic kidney disease) stage 3, GFR 30-59 ml/min (HCC)    Diabetes mellitus, type 2 (HCC)    diet controlled   History of kidney stones    X2 4or 5 yrs ago   Hypertension    controlled on meds   Leukocytosis    Peripheral vascular disease (HCC)     Surgical History: Past Surgical History:  Procedure Laterality Date   ABDOMINAL AORTOGRAM W/LOWER EXTREMITY Bilateral 01/28/2020   Procedure: ABDOMINAL AORTOGRAM W/LOWER EXTREMITY;  Surgeon: Maeola Harman, MD;  Location: [facility] INVASIVE CV LAB;   Service: Cardiovascular;  Laterality: Bilateral;   AMPUTATION Left 01/23/2020   Procedure: TRANSMETATARSAL AMPUTATION FOOT;  Surgeon: Nadara Mustard, MD;  Location: [facility] OR;  Service: Orthopedics;  Laterality: Left;   CATARACT EXTRACTION W/PHACO Left 03/19/2019   Procedure: CATARACT EXTRACTION PHACO AND INTRAOCULAR LENS PLACEMENT (IOC)  LEFT DIABETIC symfony lens;  Surgeon: Nevada Crane, MD;  Location: [facility];  Service: Ophthalmology;  Laterality: Left;  Diabetic - oral meds   CATARACT EXTRACTION W/PHACO Right 04/23/2019   Procedure: CATARACT EXTRACTION PHACO AND INTRAOCULAR LENS PLACEMENT (IOC)  RIGHT DIABETIC SYMFONY TORIC LENS;  Surgeon: Nevada Crane, MD;  Location: [facility];  Service: Ophthalmology;  Laterality: Right;  Diabetes-oral med   CYSTOSCOPY WITH LITHOLAPAXY N/A 09/20/2023   Procedure: CYSTOSCOPY WITH LITHOLAPAXY;  Surgeon: Riki Altes, MD;  Location: [facility] ORS;  Service: Urology;  Laterality: N/A;   MENISCUS REPAIR     ROTATOR CUFF REPAIR  10/2015    Home Medications:  Allergies as of 09/29/2023   No Known Allergies      Medication List        Accurate as of September 29, 2023  4:12 PM. If you have any questions, ask your nurse or doctor.          aspirin EC 81 MG tablet Take 81 mg by mouth See admin instructions. Take Sunday-Wednesday Swallow whole.   glipiZIDE 5 MG 24 hr tablet Commonly known as: GLUCOTROL XL Take 5 mg by mouth daily.  metFORMIN 500 MG tablet Commonly known as: GLUCOPHAGE Take 500 mg by mouth 2 (two) times daily with a meal.   metoprolol succinate 50 MG 24 hr tablet Commonly known as: TOPROL-XL Take 50 mg by mouth daily.   nitroGLYCERIN 0.2 mg/hr patch Commonly known as: NITRODUR - Dosed in mg/24 hr PLACE 1 PATCH (0.2 MG TOTAL) ONTO THE SKIN DAILY. What changed:  when to take this reasons to take this   phenazopyridine 200 MG tablet Commonly known as: PYRIDIUM Take 1 tablet (200 mg total) by  mouth 3 (three) times daily as needed (burning with urination).   pioglitazone 30 MG tablet Commonly known as: ACTOS Take 30 mg by mouth daily.   rosuvastatin 5 MG tablet Commonly known as: CRESTOR Take 5 mg by mouth daily.   tamsulosin 0.4 MG Caps capsule Commonly known as: FLOMAX Take 1 capsule (0.4 mg total) by mouth daily.   Vitamin B-12 5000 MCG Subl Place 5,000 mcg under the tongue daily.        Allergies: No Known Allergies  Family History: Family History  Problem Relation Age of Onset   Diabetes Mother    Diabetes Father     Social History:  reports that he has never smoked. He has never used smokeless tobacco. He reports that he does not currently use alcohol. He reports that he does not use drugs.   Physical Exam: BP (!) 158/87   Pulse 87   Constitutional:  Alert and oriented, No acute distress. HEENT: [place] AT Respiratory: Normal respiratory effort, no increased work of breathing. Psychiatric: Normal mood and affect.   Assessment & Plan:    1. BPH with urinary retention Post-op urinary retention.  PVR today 177 mL which is most likely his baseline.  Continued tamsulosin Follow up 3 months with repeat PVR and he was instructed to call earlier for worsening lower urinary tract symptoms.   I have reviewed the above documentation for accuracy and completeness, and I agree with the above.   Riki Altes, MD  [facility] [address] [place], [place] [address] [phone]

## 2023-10-04 ENCOUNTER — Encounter: Payer: Self-pay | Admitting: Urology

## 2023-10-06 ENCOUNTER — Ambulatory Visit: Payer: Medicare Other | Admitting: Orthopedic Surgery

## 2023-10-06 ENCOUNTER — Telehealth: Payer: Self-pay | Admitting: Orthopedic Surgery

## 2023-10-06 ENCOUNTER — Encounter: Payer: Self-pay | Admitting: Orthopedic Surgery

## 2023-10-06 DIAGNOSIS — Z89432 Acquired absence of left foot: Secondary | ICD-10-CM | POA: Diagnosis not present

## 2023-10-06 DIAGNOSIS — L97511 Non-pressure chronic ulcer of other part of right foot limited to breakdown of skin: Secondary | ICD-10-CM

## 2023-10-06 NOTE — Telephone Encounter (Signed)
 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

## 2023-10-06 NOTE — Telephone Encounter (Signed)
 I called pt and appt sch for 3 pm today.

## 2023-10-06 NOTE — Progress Notes (Signed)
 Office Visit Note   Patient: Brent Lam           Date of Birth: 07/05/1956           MRN: [id number] Visit Date: 10/06/2023              Requested by: Cleotilde Oneil FALCON, MD [phone] [facility] [address] [facility] [place],  [place] [address] PCP: Cleotilde Oneil FALCON, MD  Chief Complaint  Patient presents with   Right Foot - Wound Check      HPI: Patient is a 68 year old gentleman status post left transmetatarsal amputation who presents with acute ulcer beneath the second and third metatarsal heads.  Patient states he has been standing on a ladder and wears hey dude shoes.    Assessment & Plan: Visit Diagnoses:  1. History of transmetatarsal amputation of left foot (HCC)   2. Non-pressure chronic ulcer of other part of right foot limited to breakdown of skin (HCC)     Plan: Will place patient in a postoperative shoe with a felt relieving donut and provide additional felt relieving donuts.  He is to wash with soap and water  wear a Band-Aid and wear the postoperative shoe.     Follow-Up Instructions: Return in about 4 weeks (around 11/03/2023).   Ortho Exam  Patient is alert, oriented, no adenopathy, well-dressed, normal affect, normal respiratory effort. 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  He has a strong palpable dorsalis pedis pulse.  He has acute ulcer beneath the second and third metatarsal head.  There is no ascending cellulitis.  No ischemia.  After informed consent a 10 blade knife was used to debride the skin and soft tissue back to healthy viable tissue beneath the second and third metatarsal heads.  After debridement the ulcer is 2 x 4 cm and 1 mm deep.  There is no exposed bone or tendon no residual abscess or purulence.  Imaging: No results found. No images are attached to the encounter.  Labs: Lab Results  Component Value Date   HGBA1C 7.1 (H) 01/21/2020   REPTSTATUS 05/21/2020 FINAL 05/19/2020   GRAMSTAIN NO WBC  SEEN RARE GRAM POSITIVE COCCI  01/23/2020   CULT 50,000 COLONIES/mL STAPHYLOCOCCUS HAEMOLYTICUS (A) 05/19/2020   LABORGA STAPHYLOCOCCUS HAEMOLYTICUS (A) 05/19/2020     Lab Results  Component Value Date   ALBUMIN 3.7 01/21/2020    No results found for: MG No results found for: VD25OH  No results found for: PREALBUMIN    Latest Ref Rng & Units 01/29/2020    1:36 AM 01/28/2020    5:43 AM 01/26/2020    8:42 AM  CBC EXTENDED  WBC 4.0 - 10.5 K/uL 11.9  11.3  13.2   RBC 4.22 - 5.81 MIL/uL 4.43  4.36  4.45   Hemoglobin 13.0 - 17.0 g/dL 86.8  87.0  86.8   HCT 39.0 - 52.0 % 39.9  39.3  40.4   Platelets 150 - 400 K/uL 355  318  280      There is no height or weight on file to calculate BMI.  Orders:  No orders of the defined types were placed in this encounter.  No orders of the defined types were placed in this encounter.    Procedures: No procedures performed  Clinical Data: No additional findings.  ROS:  All other systems negative, except as noted in the HPI. Review of Systems  Objective: Vital Signs: There were no vitals taken for this  visit.  Specialty Comments:  No specialty comments available.  PMFS History: Patient Active Problem List   Diagnosis Date Noted   Gangrene of toe of left foot (HCC)    PVD (peripheral vascular disease) (HCC)    CKD (chronic kidney disease) stage 3, GFR 30-59 ml/min (HCC) 01/21/2020   HTN (hypertension) 01/21/2020   DM2 (diabetes mellitus, type 2) (HCC) 01/21/2020   Leukocytosis 01/21/2020   Sepsis (HCC) 01/21/2020   Osteomyelitis of toe of left foot (HCC) 01/21/2020   Osteoarthritis of toe joint, left 01/21/2020   Past Medical History:  Diagnosis Date   Abscess of left lower leg 2024   Chronic kidney disease    CKD (chronic kidney disease) stage 3, GFR 30-59 ml/min (HCC)    Diabetes mellitus, type 2 (HCC)    diet controlled   History of kidney stones    X2 4or 5 yrs ago   Hypertension    controlled on meds    Leukocytosis    Peripheral vascular disease (HCC)     Family History  Problem Relation Age of Onset   Diabetes Mother    Diabetes Father     Past Surgical History:  Procedure Laterality Date   ABDOMINAL AORTOGRAM W/LOWER EXTREMITY Bilateral 01/28/2020   Procedure: ABDOMINAL AORTOGRAM W/LOWER EXTREMITY;  Surgeon: Sheree Penne Bruckner, MD;  Location: [facility] INVASIVE CV LAB;  Service: Cardiovascular;  Laterality: Bilateral;   AMPUTATION Left 01/23/2020   Procedure: TRANSMETATARSAL AMPUTATION FOOT;  Surgeon: Harden Jerona GAILS, MD;  Location: [facility] OR;  Service: Orthopedics;  Laterality: Left;   CATARACT EXTRACTION W/PHACO Left 03/19/2019   Procedure: CATARACT EXTRACTION PHACO AND INTRAOCULAR LENS PLACEMENT (IOC)  LEFT DIABETIC symfony lens;  Surgeon: Myrna Adine Anes, MD;  Location: [facility];  Service: Ophthalmology;  Laterality: Left;  Diabetic - oral meds   CATARACT EXTRACTION W/PHACO Right 04/23/2019   Procedure: CATARACT EXTRACTION PHACO AND INTRAOCULAR LENS PLACEMENT (IOC)  RIGHT DIABETIC SYMFONY TORIC LENS;  Surgeon: Myrna Adine Anes, MD;  Location: [facility];  Service: Ophthalmology;  Laterality: Right;  Diabetes-oral med   CYSTOSCOPY WITH LITHOLAPAXY N/A 09/20/2023   Procedure: CYSTOSCOPY WITH LITHOLAPAXY;  Surgeon: Twylla Glendia BROCKS, MD;  Location: [facility] ORS;  Service: Urology;  Laterality: N/A;   MENISCUS REPAIR     ROTATOR CUFF REPAIR  10/2015   Social History   Occupational History   Not on file  Tobacco Use   Smoking status: Never   Smokeless tobacco: Never  Vaping Use   Vaping status: Never Used  Substance and Sexual Activity   Alcohol use: Not Currently   Drug use: Never   Sexual activity: Not on file

## 2023-10-21 ENCOUNTER — Ambulatory Visit: Payer: Medicare Other | Admitting: Urology

## 2023-11-07 ENCOUNTER — Ambulatory Visit (INDEPENDENT_AMBULATORY_CARE_PROVIDER_SITE_OTHER): Payer: Medicare Other | Admitting: Orthopedic Surgery

## 2023-11-07 ENCOUNTER — Encounter: Payer: Self-pay | Admitting: Orthopedic Surgery

## 2023-11-07 DIAGNOSIS — L97511 Non-pressure chronic ulcer of other part of right foot limited to breakdown of skin: Secondary | ICD-10-CM | POA: Diagnosis not present

## 2023-11-07 DIAGNOSIS — Z89432 Acquired absence of left foot: Secondary | ICD-10-CM

## 2023-11-07 NOTE — Progress Notes (Signed)
Office Visit Note   Patient: Brent Lam           Date of Birth: 07/30/1956           MRN: [id number] Visit Date: 11/07/2023              Requested by: Danella Penton, MD [phone] [facility] [address] [facility] [place],  [place] [address] PCP: Danella Penton, MD  Chief Complaint  Patient presents with   Right Foot - Wound Check    Hx transmet amputation      HPI: Patient is a 68 year old gentleman who is seen in follow-up for [facility] grade 1 ulcer beneath the second metatarsal head.  Patient has undergone previous debridements.  Patient is a postoperative shoe with a felt relieving donut.  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  Assessment & Plan: Visit Diagnoses:  1. History of transmetatarsal amputation of left foot (HCC)   2. Non-pressure chronic ulcer of other part of right foot limited to breakdown of skin [facility])     Plan: Patient would like to hold surgery till his Cropp is established.  Will plan to follow-up in 4 weeks.  Discussed potential surgical intervention with Weil osteotomy from the second metatarsal.  Will obtain three-view radiographs at follow-up.  Follow-Up Instructions: Return in about 4 weeks (around 12/05/2023).   Ortho Exam  Patient is alert, oriented, no adenopathy, well-dressed, normal affect, normal respiratory effort. Examination patient has a strong dorsalis pedis pulse.  There is no active Charcot process or cellulitis.  Patient has a persistent Wagner grade 1 ulcer beneath the second metatarsal head.  After informed consent a 10 blade knife was used to debride the skin and soft tissue back to healthy viable granulation tissue that is flat there is no tunneling no exposed bone or tendon no drainage.  Imaging: No results found. No images are attached to the encounter.  Labs: Lab Results  Component Value Date   HGBA1C 7.1 (H) 01/21/2020   REPTSTATUS 05/21/2020 FINAL 05/19/2020   GRAMSTAIN NO  WBC SEEN RARE GRAM POSITIVE COCCI  01/23/2020   CULT 50,000 COLONIES/mL STAPHYLOCOCCUS HAEMOLYTICUS (A) 05/19/2020   LABORGA STAPHYLOCOCCUS HAEMOLYTICUS (A) 05/19/2020     Lab Results  Component Value Date   ALBUMIN 3.7 01/21/2020    No results found for: "MG" No results found for: "VD25OH"  No results found for: "PREALBUMIN"    Latest Ref Rng & Units 01/29/2020    1:36 AM 01/28/2020    5:43 AM 01/26/2020    8:42 AM  CBC EXTENDED  WBC 4.0 - 10.5 K/uL 11.9  11.3  13.2   RBC 4.22 - 5.81 MIL/uL 4.43  4.36  4.45   Hemoglobin 13.0 - 17.0 g/dL 13.2  44.0  10.2   HCT 39.0 - 52.0 % 39.9  39.3  40.4   Platelets 150 - 400 K/uL 355  318  280      There is no height or weight on file to calculate BMI.  Orders:  No orders of the defined types were placed in this encounter.  No orders of the defined types were placed in this encounter.    Procedures: No procedures performed  Clinical Data: No additional findings.  ROS:  All other systems negative, except as noted in the HPI. Review of Systems  Objective: Vital Signs: There were no vitals taken for this visit.  Specialty Comments:  No specialty comments available.  PMFS  History: Patient Active Problem List   Diagnosis Date Noted   Gangrene of toe of left foot (HCC)    PVD (peripheral vascular disease) (HCC)    CKD (chronic kidney disease) stage 3, GFR 30-59 ml/min (HCC) 01/21/2020   HTN (hypertension) 01/21/2020   DM2 (diabetes mellitus, type 2) (HCC) 01/21/2020   Leukocytosis 01/21/2020   Sepsis (HCC) 01/21/2020   Osteomyelitis of toe of left foot (HCC) 01/21/2020   Osteoarthritis of toe joint, left 01/21/2020   Past Medical History:  Diagnosis Date   Abscess of left lower leg 2024   Chronic kidney disease    CKD (chronic kidney disease) stage 3, GFR 30-59 ml/min (HCC)    Diabetes mellitus, type 2 (HCC)    diet controlled   History of kidney stones    X2 4or 5 yrs ago   Hypertension    controlled on meds    Leukocytosis    Peripheral vascular disease (HCC)     Family History  Problem Relation Age of Onset   Diabetes Mother    Diabetes Father     Past Surgical History:  Procedure Laterality Date   ABDOMINAL AORTOGRAM W/LOWER EXTREMITY Bilateral 01/28/2020   Procedure: ABDOMINAL AORTOGRAM W/LOWER EXTREMITY;  Surgeon: Maeola Harman, MD;  Location: [facility] INVASIVE CV LAB;  Service: Cardiovascular;  Laterality: Bilateral;   AMPUTATION Left 01/23/2020   Procedure: TRANSMETATARSAL AMPUTATION FOOT;  Surgeon: Nadara Mustard, MD;  Location: [facility] OR;  Service: Orthopedics;  Laterality: Left;   CATARACT EXTRACTION W/PHACO Left 03/19/2019   Procedure: CATARACT EXTRACTION PHACO AND INTRAOCULAR LENS PLACEMENT (IOC)  LEFT DIABETIC symfony lens;  Surgeon: Nevada Crane, MD;  Location: [facility] SURGERY CNTR;  Service: Ophthalmology;  Laterality: Left;  Diabetic - oral meds   CATARACT EXTRACTION W/PHACO Right 04/23/2019   Procedure: CATARACT EXTRACTION PHACO AND INTRAOCULAR LENS PLACEMENT (IOC)  RIGHT DIABETIC SYMFONY TORIC LENS;  Surgeon: Nevada Crane, MD;  Location: [facility] SURGERY CNTR;  Service: Ophthalmology;  Laterality: Right;  Diabetes-oral med   CYSTOSCOPY WITH LITHOLAPAXY N/A 09/20/2023   Procedure: CYSTOSCOPY WITH LITHOLAPAXY;  Surgeon: Riki Altes, MD;  Location: [facility] ORS;  Service: Urology;  Laterality: N/A;   MENISCUS REPAIR     ROTATOR CUFF REPAIR  10/2015   Social History   Occupational History   Not on file  Tobacco Use   Smoking status: Never   Smokeless tobacco: Never  Vaping Use   Vaping status: Never Used  Substance and Sexual Activity   Alcohol use: Not Currently   Drug use: Never   Sexual activity: Not on file

## 2023-12-01 ENCOUNTER — Encounter: Payer: Self-pay | Admitting: Urology

## 2023-12-05 ENCOUNTER — Other Ambulatory Visit: Payer: Self-pay | Admitting: Urology

## 2023-12-05 ENCOUNTER — Ambulatory Visit: Payer: Medicare Other | Admitting: Orthopedic Surgery

## 2023-12-05 ENCOUNTER — Encounter: Payer: Self-pay | Admitting: Urology

## 2023-12-05 DIAGNOSIS — N200 Calculus of kidney: Secondary | ICD-10-CM

## 2023-12-05 NOTE — Progress Notes (Unsigned)
 12/06/2023 11:20 AM   Brent Lam 08/27/1956 [id number]  Referring provider: Danella Penton, MD [phone] [facility] [address] [facility] [place],  [place] [address]  Urological history: 1.  Bladder calculus -cystolitholapaxy (09/2023) - 15 mm bladder calculus   2.  Bilateral nephrolithiasis -contrast CT (02/2023) - non - bilateral renal calculi   3. BPH with LU TS -Tamsulosin 0.4 mg daily  No chief complaint on file.  HPI: Brent Lam is a 68 y.o. male who presents today for gross heme and blood clots.  Previous records reviewed.   He called yesterday stating he passed a kidney stone on Thursday and had been doing well until Sunday night when he began to notice blood in his urine with some small clots.  He denied any dysuria, chills, pain or fever.  He was offered several appointments today for further evaluation, but he had his mother's funeral yesterday and could not make any of the appointments.  KUB ***  UA ***  PVR ***  PMH: Past Medical History:  Diagnosis Date   Abscess of left lower leg 2024   Chronic kidney disease    CKD (chronic kidney disease) stage 3, GFR 30-59 ml/min (HCC)    Diabetes mellitus, type 2 (HCC)    diet controlled   History of kidney stones    X2 4or 5 yrs ago   Hypertension    controlled on meds   Leukocytosis    Peripheral vascular disease (HCC)     Surgical History: Past Surgical History:  Procedure Laterality Date   ABDOMINAL AORTOGRAM W/LOWER EXTREMITY Bilateral 01/28/2020   Procedure: ABDOMINAL AORTOGRAM W/LOWER EXTREMITY;  Surgeon: Maeola Harman, MD;  Location: [facility] INVASIVE CV LAB;  Service: Cardiovascular;  Laterality: Bilateral;   AMPUTATION Left 01/23/2020   Procedure: TRANSMETATARSAL AMPUTATION FOOT;  Surgeon: Nadara Mustard, MD;  Location: [facility] OR;  Service: Orthopedics;  Laterality: Left;   CATARACT EXTRACTION W/PHACO Left 03/19/2019   Procedure: CATARACT EXTRACTION PHACO AND INTRAOCULAR LENS  PLACEMENT (IOC)  LEFT DIABETIC symfony lens;  Surgeon: Nevada Crane, MD;  Location: [facility] SURGERY CNTR;  Service: Ophthalmology;  Laterality: Left;  Diabetic - oral meds   CATARACT EXTRACTION W/PHACO Right 04/23/2019   Procedure: CATARACT EXTRACTION PHACO AND INTRAOCULAR LENS PLACEMENT (IOC)  RIGHT DIABETIC SYMFONY TORIC LENS;  Surgeon: Nevada Crane, MD;  Location: [facility] SURGERY CNTR;  Service: Ophthalmology;  Laterality: Right;  Diabetes-oral med   CYSTOSCOPY WITH LITHOLAPAXY N/A 09/20/2023   Procedure: CYSTOSCOPY WITH LITHOLAPAXY;  Surgeon: Riki Altes, MD;  Location: [facility] ORS;  Service: Urology;  Laterality: N/A;   MENISCUS REPAIR     ROTATOR CUFF REPAIR  10/2015    Home Medications:  Allergies as of 12/06/2023   No Known Allergies      Medication List        Accurate as of December 05, 2023 11:20 AM. If you have any questions, ask your nurse or doctor.          aspirin EC 81 MG tablet Take 81 mg by mouth See admin instructions. Take Sunday-Wednesday Swallow whole.   glipiZIDE 5 MG 24 hr tablet Commonly known as: GLUCOTROL XL Take 5 mg by mouth daily.   metFORMIN 500 MG tablet Commonly known as: GLUCOPHAGE Take 500 mg by mouth 2 (two) times daily with a meal.   metoprolol succinate 50 MG 24 hr tablet Commonly known as: TOPROL-XL Take 50 mg by mouth daily.   nitroGLYCERIN 0.2 mg/hr  patch Commonly known as: NITRODUR - Dosed in mg/24 hr PLACE 1 PATCH (0.2 MG TOTAL) ONTO THE SKIN DAILY. What changed:  when to take this reasons to take this   phenazopyridine 200 MG tablet Commonly known as: PYRIDIUM Take 1 tablet (200 mg total) by mouth 3 (three) times daily as needed (burning with urination).   pioglitazone 30 MG tablet Commonly known as: ACTOS Take 30 mg by mouth daily.   rosuvastatin 5 MG tablet Commonly known as: CRESTOR Take 5 mg by mouth daily.   tamsulosin 0.4 MG Caps capsule Commonly known as: FLOMAX Take 1 capsule (0.4 mg total) by  mouth daily.   Vitamin B-12 5000 MCG Subl Place 5,000 mcg under the tongue daily.        Allergies: No Known Allergies  Family History: Family History  Problem Relation Age of Onset   Diabetes Mother    Diabetes Father     Social History:  reports that he has never smoked. He has never used smokeless tobacco. He reports that he does not currently use alcohol. He reports that he does not use drugs.  ROS: Pertinent ROS in HPI  Physical Exam: There were no vitals taken for this visit.  Constitutional:  Well nourished. Alert and oriented, No acute distress. HEENT: [place] AT, moist mucus membranes.  Trachea midline, no masses. Cardiovascular: No clubbing, cyanosis, or edema. Respiratory: Normal respiratory effort, no increased work of breathing. GI: Abdomen is soft, non tender, non distended, no abdominal masses. Liver and spleen not palpable.  No hernias appreciated.  Stool sample for occult testing is not indicated.   GU: No CVA tenderness.  No bladder fullness or masses.  Patient with circumcised/uncircumcised phallus. ***Foreskin easily retracted***  Urethral meatus is patent.  No penile discharge. No penile lesions or rashes. Scrotum without lesions, cysts, rashes and/or edema.  Testicles are located scrotally bilaterally. No masses are appreciated in the testicles. Left and right epididymis are normal. Rectal: Patient with  normal sphincter tone. Anus and perineum without scarring or rashes. No rectal masses are appreciated. Prostate is approximately *** grams, *** nodules are appreciated. Seminal vesicles are normal. Skin: No rashes, bruises or suspicious lesions. Lymph: No cervical or inguinal adenopathy. Neurologic: Grossly intact, no focal deficits, moving all 4 extremities. Psychiatric: Normal mood and affect.  Laboratory Data: Urinalysis See EPIC and HPI  I have reviewed the labs.   Pertinent Imaging: KUB ***, radiologist interpretation still pending I have  independently reviewed the films.    Assessment & Plan:  ***  1. Gross hematuria -May be secondary to stone passage versus UTI *** -UA -Urine sent for culture  2. BPH with LUTS -PSA stable *** -DRE benign *** -UA benign *** -PVR < 300 cc *** -symptoms - *** -most bothersome symptoms are *** -continue conservative management, avoiding bladder irritants and timed voiding's -Initiate alpha-blocker (***), discussed side effects *** -Initiate 5 alpha reductase inhibitor (***), discussed side effects *** -Continue tamsulosin 0.4 mg daily, alfuzosin 10 mg daily, Rapaflo 8 mg daily, terazosin, doxazosin, Cialis 5 mg daily and finasteride 5 mg daily, dutasteride 0.5 mg daily***:refills given -Cannot tolerate medication or medication failure, schedule cystoscopy ***  3.  Nephrolithiasis -KUB ***    No follow-ups on file.  These notes generated with voice recognition software. I apologize for typographical errors.  Cloretta Ned  [facility] [address]  [address] [place], [place] [address] [phone]

## 2023-12-05 NOTE — Telephone Encounter (Signed)
 Pt's girlfriend calls triage line (listed on DPR) she states that patient passed kidney stone on Thursday and had been doing well with no complaints until last night at when he began to notice blood in his urine with small clots. Patient denies dysuria, chills, pain or fever. Girlfriend states that the patient is scheduled for an office visit tomorrow but would like to be seen today, offered patient multiple same day appt slots, each was refused due to the patient attending his mother's funeral today. Bleeding, urinary retention and fever/pain Ed precautions given to patient and girlfriend who both voice understanding.

## 2023-12-05 NOTE — Telephone Encounter (Signed)
 Appointment scheduled.

## 2023-12-05 NOTE — Telephone Encounter (Signed)
 See other notes, appointment scheduled

## 2023-12-06 ENCOUNTER — Ambulatory Visit
Admission: RE | Admit: 2023-12-06 | Discharge: 2023-12-06 | Disposition: A | Discharge status: Home / Self Care | Attending: Urology | Admitting: Urology

## 2023-12-06 ENCOUNTER — Encounter: Payer: Self-pay | Admitting: Urology

## 2023-12-06 ENCOUNTER — Ambulatory Visit (INDEPENDENT_AMBULATORY_CARE_PROVIDER_SITE_OTHER): Admitting: Urology

## 2023-12-06 ENCOUNTER — Ambulatory Visit
Admission: RE | Admit: 2023-12-06 | Discharge: 2023-12-06 | Disposition: A | Discharge status: Home / Self Care | Source: Ambulatory Visit | Attending: Urology | Admitting: Urology

## 2023-12-06 VITALS — BP 123/76 | HR 94 | Ht 72.0 in | Wt 230.0 lb

## 2023-12-06 DIAGNOSIS — R339 Retention of urine, unspecified: Secondary | ICD-10-CM | POA: Diagnosis not present

## 2023-12-06 DIAGNOSIS — N200 Calculus of kidney: Secondary | ICD-10-CM

## 2023-12-06 DIAGNOSIS — R310 Gross hematuria: Secondary | ICD-10-CM

## 2023-12-06 DIAGNOSIS — N138 Other obstructive and reflux uropathy: Secondary | ICD-10-CM

## 2023-12-06 DIAGNOSIS — N401 Enlarged prostate with lower urinary tract symptoms: Secondary | ICD-10-CM | POA: Diagnosis not present

## 2023-12-06 LAB — URINALYSIS, COMPLETE
Bilirubin, UA: NEGATIVE
Glucose, UA: NEGATIVE
Ketones, UA: NEGATIVE
Leukocytes,UA: NEGATIVE
Nitrite, UA: NEGATIVE
Specific Gravity, UA: 1.02 (ref 1.005–1.030)
Urobilinogen, Ur: 0.2 mg/dL (ref 0.2–1.0)
pH, UA: 5.5 (ref 5.0–7.5)

## 2023-12-06 LAB — MICROSCOPIC EXAMINATION: RBC, Urine: >30 /HPF — AB (ref 0–2)

## 2023-12-06 LAB — BLADDER SCAN AMB NON-IMAGING

## 2023-12-06 MED ORDER — TAMSULOSIN HCL 0.4 MG PO CAPS: 0.4000 mg | ORAL_CAPSULE | Freq: Every day | ORAL | 3 refills | Status: AC

## 2023-12-12 ENCOUNTER — Other Ambulatory Visit: Payer: Self-pay

## 2023-12-12 ENCOUNTER — Ambulatory Visit (INDEPENDENT_AMBULATORY_CARE_PROVIDER_SITE_OTHER): Payer: Medicare Other | Admitting: Orthopedic Surgery

## 2023-12-12 ENCOUNTER — Other Ambulatory Visit (INDEPENDENT_AMBULATORY_CARE_PROVIDER_SITE_OTHER): Payer: Self-pay

## 2023-12-12 ENCOUNTER — Encounter: Payer: Self-pay | Admitting: Orthopedic Surgery

## 2023-12-12 DIAGNOSIS — L02416 Cutaneous abscess of left lower limb: Secondary | ICD-10-CM | POA: Diagnosis not present

## 2023-12-12 DIAGNOSIS — M79671 Pain in right foot: Secondary | ICD-10-CM

## 2023-12-12 DIAGNOSIS — Z89432 Acquired absence of left foot: Secondary | ICD-10-CM

## 2023-12-12 DIAGNOSIS — L97511 Non-pressure chronic ulcer of other part of right foot limited to breakdown of skin: Secondary | ICD-10-CM | POA: Diagnosis not present

## 2023-12-12 LAB — CULTURE, URINE COMPREHENSIVE

## 2023-12-12 MED ORDER — CIPROFLOXACIN HCL 500 MG PO TABS: 500.0000 mg | ORAL_TABLET | Freq: Two times a day (BID) | ORAL | 0 refills | Status: AC

## 2023-12-12 NOTE — Progress Notes (Signed)
 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  Patient has undergone serial debridements with persistent recurrent ulceration.  Assessment & Plan: Visit Diagnoses:  1. Pain in right foot   2. History of transmetatarsal amputation of left foot (HCC)   3. Non-pressure chronic ulcer of other part of right foot limited to breakdown of skin (HCC)   4. Abscess of left lower leg     Plan: Ulcer was debrided.  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  Will call to try to set up surgery in December.  Would plan for a single incision over the third metatarsal with a Weil osteotomy for the second third and fourth metatarsals.  Patient would be at increased risk of wound healing due to the peripheral vascular disease.  Follow-Up Instructions: Return if symptoms worsen or fail to improve.   Ortho Exam  Patient is alert, oriented, no adenopathy, well-dressed, normal affect, normal respiratory effort. Examination patient has a strong palpable dorsalis pedis pulse.  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  There is a Wagner grade 1 ulcer beneath the second metatarsal head.  After informed consent a 10 blade knife was used to debride the ulcer back to flat viable granulation tissue.  The ulcer is 1 cm in diameter after debridement.  With palpation patient has prominence of the second third and fourth metatarsals and the radiographs show that these are long compared to  the first and fifth metatarsal.  Imaging: XR Foot Complete Right Result Date: 12/12/2023 Three-view radiographs of the right foot shows significant peripheral vascular disease with calcification of the arteries out to the metatarsal heads.  Patient has a long second third and fourth metatarsal.  There are arthritic changes with Charcot collapse through the midfoot.  No images are attached to the encounter.  Labs: Lab Results  Component Value Date   HGBA1C 7.1 (H) 01/21/2020   REPTSTATUS 05/21/2020 FINAL 05/19/2020   GRAMSTAIN NO WBC SEEN RARE GRAM POSITIVE COCCI  01/23/2020   CULT 50,000 COLONIES/mL STAPHYLOCOCCUS HAEMOLYTICUS (A) 05/19/2020   LABORGA STAPHYLOCOCCUS HAEMOLYTICUS (A) 05/19/2020     Lab Results  Component Value Date   ALBUMIN 3.7 01/21/2020    No results found for: "MG" No results found for: "VD25OH"  No results found for: "PREALBUMIN"    Latest Ref Rng & Units 01/29/2020    1:36 AM 01/28/2020    5:43 AM 01/26/2020    8:42 AM  CBC EXTENDED  WBC 4.0 - 10.5 K/uL 11.9  11.3  13.2   RBC 4.22 - 5.81 MIL/uL 4.43  4.36  4.45   Hemoglobin 13.0 - 17.0 g/dL 52.8  41.3  24.4   HCT 39.0 - 52.0 % 39.9  39.3  40.4   Platelets 150 - 400 K/uL 355  318  280      There is no height or weight on file to calculate BMI.  Orders:  Orders Placed This Encounter  Procedures   XR Foot Complete Right   No orders of the defined types were placed in this encounter.    Procedures: No procedures performed  Clinical Data: No additional findings.  ROS:  All other systems negative, except as noted in the HPI. Review of Systems  Objective: Vital Signs: There were no vitals taken for this visit.  Specialty Comments:  No specialty comments available.  PMFS History: Patient Active Problem List   Diagnosis Date Noted   Gangrene of toe of left foot (HCC)    PVD (peripheral vascular disease) (HCC)    CKD (chronic kidney disease) stage 3, GFR 30-59 ml/min (HCC)  01/21/2020   HTN (hypertension) 01/21/2020   DM2 (diabetes mellitus, type 2) (HCC) 01/21/2020   Leukocytosis 01/21/2020   Sepsis (HCC) 01/21/2020   Osteomyelitis of toe of left foot (HCC) 01/21/2020   Osteoarthritis of toe joint, left 01/21/2020   Past Medical History:  Diagnosis Date   Abscess of left lower leg 2024   Chronic kidney disease    CKD (chronic kidney disease) stage 3, GFR 30-59 ml/min (HCC)    Diabetes mellitus, type 2 (HCC)    diet controlled   History of kidney stones    X2 4or 5 yrs ago   Hypertension    controlled on meds   Leukocytosis    Peripheral vascular disease (HCC)     Family History  Problem Relation Age of Onset   Diabetes Mother    Diabetes Father     Past Surgical History:  Procedure Laterality Date   ABDOMINAL AORTOGRAM W/LOWER EXTREMITY Bilateral 01/28/2020   Procedure: ABDOMINAL AORTOGRAM W/LOWER EXTREMITY;  Surgeon: Maeola Harman, MD;  Location[facility] INVASIVE CV LAB;  Service: Cardiovascular;  Laterality: Bilateral;   AMPUTATION Left 01/23/2020   Procedure: TRANSMETATARSAL AMPUTATION FOOT;  Surgeon: Nadara Mustard, MD;  Location: [facility] OR;  Service: Orthopedics;  Laterality: Left;   CATARACT EXTRACTION W/PHACO Left 03/19/2019   Procedure: CATARACT EXTRACTION PHACO AND INTRAOCULAR LENS PLACEMENT (IOC)  LEFT DIABETIC symfony lens;  Surgeon: Nevada Crane, MD;  Location: [facility] SURGERY CNTR;  Service: Ophthalmology;  Laterality: Left;  Diabetic - oral meds   CATARACT EXTRACTION W/PHACO Right 04/23/2019   Procedure: CATARACT EXTRACTION PHACO AND INTRAOCULAR LENS PLACEMENT (IOC)  RIGHT DIABETIC SYMFONY TORIC LENS;  Surgeon: Nevada Crane, MD;  Location: [facility] SURGERY CNTR;  Service: Ophthalmology;  Laterality: Right;  Diabetes-oral med   CYSTOSCOPY WITH LITHOLAPAXY N/A 09/20/2023   Procedure: CYSTOSCOPY WITH LITHOLAPAXY;  Surgeon: Riki Altes, MD;  Location: [facility] ORS;  Service: Urology;  Laterality: N/A;   MENISCUS REPAIR      ROTATOR CUFF REPAIR  10/2015   Social History   Occupational History   Not on file  Tobacco Use   Smoking status: Never   Smokeless tobacco: Never  Vaping Use   Vaping status: Never Used  Substance and Sexual Activity   Alcohol use: Not Currently   Drug use: Never   Sexual activity: Not on file

## 2023-12-22 ENCOUNTER — Ambulatory Visit (INDEPENDENT_AMBULATORY_CARE_PROVIDER_SITE_OTHER): Payer: Medicare Other | Admitting: Urology

## 2023-12-22 ENCOUNTER — Encounter: Payer: Self-pay | Admitting: Urology

## 2023-12-22 VITALS — BP 100/62 | HR 96 | Ht 72.0 in | Wt 230.0 lb

## 2023-12-22 DIAGNOSIS — Z9889 Other specified postprocedural states: Secondary | ICD-10-CM

## 2023-12-22 DIAGNOSIS — R310 Gross hematuria: Secondary | ICD-10-CM

## 2023-12-22 DIAGNOSIS — N138 Other obstructive and reflux uropathy: Secondary | ICD-10-CM

## 2023-12-22 DIAGNOSIS — N401 Enlarged prostate with lower urinary tract symptoms: Secondary | ICD-10-CM | POA: Diagnosis not present

## 2023-12-22 LAB — BLADDER SCAN AMB NON-IMAGING: Scan Result: 0

## 2023-12-22 NOTE — Progress Notes (Signed)
 I, Maysun Anabel Bene, acting as a scribe for Riki Altes, MD., have documented all relevant documentation on the behalf of Riki Altes, MD, as directed by Riki Altes, MD while in the presence of Riki Altes, MD.  12/22/2023 6:18 PM   Brent Lam June 24, 1956 [id number]  Referring provider: Danella Penton, MD [phone] [facility] [address] [facility] [place],  [place] [address]  Chief Complaint  Patient presents with   Medical Management of Chronic Issues   Urologic history: 1. Bladder calculus Cystolitholapaxy 09/20/23 15 mm bladder calculus   2. Bilateral nephrolithiasis Nonobstructing renal calculi   3.  BPH with LUTS Postop urinary retention Tamsulosin 0.4 mg  HPI: Brent Lam is a 68 y.o. male presents for 3 month follow-up.   He presented the day after his procedure with urinary tension and had a Foley catheter placed. A follow-up PVR at the time of his voiding trial on 09/29/23 was 177 mL. He saw Michiel Cowboy 12/06/23 complaining of gross hematuria. He had had passed a stone several days prior to that visit and it was felt his hematuria was secondary to stone passage. His urine culture degrosed to haemolyticus and he had one additional episode of hematuria the day he started antibiotics and has been asymptomatic since that time. He was scheduled for a follow-up visit with [facility] for a repeat UA, and he emptied today for a PVR did not give a urine.  His PVR was 0 mL.    PMH: Past Medical History:  Diagnosis Date   Abscess of left lower leg 2024   Chronic kidney disease    CKD (chronic kidney disease) stage 3, GFR 30-59 ml/min (HCC)    Diabetes mellitus, type 2 (HCC)    diet controlled   History of kidney stones    X2 4or 5 yrs ago   Hypertension    controlled on meds   Leukocytosis    Peripheral vascular disease (HCC)     Surgical History: Past Surgical History:  Procedure Laterality Date   ABDOMINAL AORTOGRAM W/LOWER EXTREMITY  Bilateral 01/28/2020   Procedure: ABDOMINAL AORTOGRAM W/LOWER EXTREMITY;  Surgeon: Maeola Harman, MD;  Location: [facility] INVASIVE CV LAB;  Service: Cardiovascular;  Laterality: Bilateral;   AMPUTATION Left 01/23/2020   Procedure: TRANSMETATARSAL AMPUTATION FOOT;  Surgeon: Nadara Mustard, MD;  Location: [facility] OR;  Service: Orthopedics;  Laterality: Left;   CATARACT EXTRACTION W/PHACO Left 03/19/2019   Procedure: CATARACT EXTRACTION PHACO AND INTRAOCULAR LENS PLACEMENT (IOC)  LEFT DIABETIC symfony lens;  Surgeon: Nevada Crane, MD;  Location: [facility] SURGERY CNTR;  Service: Ophthalmology;  Laterality: Left;  Diabetic - oral meds   CATARACT EXTRACTION W/PHACO Right 04/23/2019   Procedure: CATARACT EXTRACTION PHACO AND INTRAOCULAR LENS PLACEMENT (IOC)  RIGHT DIABETIC SYMFONY TORIC LENS;  Surgeon: Nevada Crane, MD;  Location: [facility] SURGERY CNTR;  Service: Ophthalmology;  Laterality: Right;  Diabetes-oral med   CYSTOSCOPY WITH LITHOLAPAXY N/A 09/20/2023   Procedure: CYSTOSCOPY WITH LITHOLAPAXY;  Surgeon: Riki Altes, MD;  Location: [facility] ORS;  Service: Urology;  Laterality: N/A;   MENISCUS REPAIR     ROTATOR CUFF REPAIR  10/2015    Home Medications:  Allergies as of 12/22/2023   No Known Allergies      Medication List        Accurate as of December 22, 2023  6:18 PM. If you have any questions, ask your nurse or doctor.  aspirin EC 81 MG tablet Take 81 mg by mouth See admin instructions. Take Sunday-Wednesday Swallow whole.   glipiZIDE 5 MG 24 hr tablet Commonly known as: GLUCOTROL XL Take 5 mg by mouth daily.   metFORMIN 500 MG tablet Commonly known as: GLUCOPHAGE Take 500 mg by mouth 2 (two) times daily with a meal.   metoprolol succinate 50 MG 24 hr tablet Commonly known as: TOPROL-XL Take 50 mg by mouth daily.   nitroGLYCERIN 0.2 mg/hr patch Commonly known as: NITRODUR - Dosed in mg/24 hr PLACE 1 PATCH (0.2 MG TOTAL) ONTO THE SKIN DAILY. What  changed:  when to take this reasons to take this   phenazopyridine 200 MG tablet Commonly known as: PYRIDIUM Take 1 tablet (200 mg total) by mouth 3 (three) times daily as needed (burning with urination).   pioglitazone 30 MG tablet Commonly known as: ACTOS Take 30 mg by mouth daily.   rosuvastatin 5 MG tablet Commonly known as: CRESTOR Take 5 mg by mouth daily.   tamsulosin 0.4 MG Caps capsule Commonly known as: FLOMAX Take 1 capsule (0.4 mg total) by mouth daily.   Vitamin B-12 5000 MCG Subl Place 5,000 mcg under the tongue daily.        Allergies: No Known Allergies  Family History: Family History  Problem Relation Age of Onset   Diabetes Mother    Diabetes Father     Social History:  reports that he has never smoked. He has never used smokeless tobacco. He reports that he does not currently use alcohol. He reports that he does not use drugs.   Physical Exam: BP 100/62   Pulse 96   Ht 6' (1.829 m)   Wt 230 lb (104.3 kg)   BMI 31.19 kg/m   Constitutional:  Alert and oriented, No acute distress. HEENT: [place] AT, moist mucus membranes.  Trachea midline, no masses. Cardiovascular: No clubbing, cyanosis, or edema. Respiratory: Normal respiratory effort, no increased work of breathing. GI: Abdomen is soft, nontender, nondistended, no abdominal masses Skin: No rashes, bruises or suspicious lesions. Neurologic: Grossly intact, no focal deficits, moving all 4 extremities. Psychiatric: Normal mood and affect.   Assessment & Plan:    1. Status post-cystolitholapaxy Doing well  2. BPH with LUTS PVR today 0 mL  3. History of gross hematuria Most likely secondary to past stone/UTI.  He is scheduled for an appointment tomorrow Dr. Hyacinth Meeker and states that urinalysis will be performed at that visit. If he has no significant microhematuria, we'll cancel his appointment with Carollee Herter next month and schedule a 6 month follow-up with KUB/PVR.  [facility] [address] [place], [place] [address] [phone]

## 2023-12-23 ENCOUNTER — Encounter: Payer: Self-pay | Admitting: Urology

## 2023-12-28 ENCOUNTER — Telehealth: Payer: Self-pay

## 2023-12-28 ENCOUNTER — Encounter: Payer: Self-pay | Admitting: Urology

## 2023-12-28 NOTE — Telephone Encounter (Signed)
 Recommend he keep follow-up with Carollee Herter.  Since he underwent cystoscopy in December his hematuria is most likely secondary to BPH.  May need repeat cystoscopy if hematuria continues but will see how he is doing after he sees [address]

## 2023-12-28 NOTE — Telephone Encounter (Signed)
 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 Blood is bright red in the urine, last night had a lot of urgency to urinate and had to run to the bathroom. No fever, nausea, vomiting, or pain. Patient has a follow up on 4/4 with Carollee Herter but wanted to check if anything different needs to be done at this time. Patient saw Dr Lonna Cobb LOV 12/22/23 for gross hematuria.

## 2023-12-28 NOTE — Telephone Encounter (Signed)
 [name] advised.

## 2024-01-05 NOTE — Progress Notes (Signed)
 01/06/2024 8:29 AM   Brent Lam 03-Aug-1956 [id number]  Referring provider: Danella Penton, MD [phone] [place] [address] [place],  [place] [address]  Urological history: 1.  Bladder calculus -cystolitholapaxy (09/2023) - 15 mm bladder calculus   2.  Bilateral nephrolithiasis -contrast CT (02/2023) - non - bilateral renal calculi   3. BPH with LU TS -Tamsulosin 0.4 mg daily  Chief Complaint  Patient presents with   Hematuria   Benign Prostatic Hypertrophy   Nephrolithiasis   HPI: Brent Lam is a 68 y.o. male who presents today for gross heme and blood clots with his SO, Brent Lam.   Previous records reviewed.   At his visit on 12/06/2023, He called yesterday stating he passed a kidney stone on Thursday and had been doing well until Sunday night when he began to notice blood in his urine with some small clots.  He denied any dysuria, chills, pain or fever.  He was offered several appointments today for further evaluation, but he had his mother's funeral yesterday and could not make any of the appointments.  On Wednesday evening he started to have left lower quadrant pain, urinary urgency, frequency and cannot be still.  He states he was up and down all that night and then Thursday morning about 10 AM spontaneously passed an 8 mm stone.  He then had passage of clots and gross heme that is since resolved about 12 hours ago.  He feels better today.  Patient denies any modifying or aggravating factors.  Patient denies any recent UTI's, gross hematuria, dysuria or suprapubic/flank pain.  Patient denies any fevers, chills, nausea or vomiting.  KUB right renal stones.  UA yellow slightly cloudy, specific gravity 1.020, 3+ blood, pH 5.5, 1+ protein, 0-5 WBCs, greater than 30 RBCs, 0-10 epithelial cells and a few bacteria.  PVR 0 mL.  Urine culture was positive for Staphylococcus hemolyticus.   He follow-up with Dr. Lonna Lam on December 22, 2023 and was doing well.   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  He had a lot of urgency to urinate.  No fevers, nausea, vomiting or pain.    The gross heme has since abated and he feels well today, but he has not been on his tractor and engage in heavy lifting recently.  He will need to get back on his tractor next week, so we will see if the gross heme returns.    Patient denies any modifying or aggravating factors.  Patient denies any recent UTI's, gross hematuria, dysuria or suprapubic/flank pain.  Patient denies any fevers, chills, nausea or vomiting.    UA yellow slightly cloudy, trace glucose, specific gravity 1.025, 3+ blood, 5.5 pH, 1+ protein, greater than 30 WBCs, greater than 30 RBCs, 0-10 epithelial cells and moderate bacteria.  PMH: Past Medical History:  Diagnosis Date   Abscess of left lower leg 2024   Chronic kidney disease    CKD (chronic kidney disease) stage 3, GFR 30-59 ml/min (HCC)    Diabetes mellitus, type 2 (HCC)    diet controlled   History of kidney stones    X2 4or 5 yrs ago   Hypertension    controlled on meds   Leukocytosis    Peripheral vascular disease (HCC)     Surgical History: Past Surgical History:  Procedure Laterality Date  ABDOMINAL AORTOGRAM W/LOWER EXTREMITY Bilateral 01/28/2020   Procedure: ABDOMINAL AORTOGRAM W/LOWER EXTREMITY;  Surgeon: Brent Harman, MD;  Location: [facility] INVASIVE CV LAB;  Service: Cardiovascular;  Laterality: Bilateral;   AMPUTATION Left 01/23/2020   Procedure: TRANSMETATARSAL AMPUTATION FOOT;  Surgeon: Brent Mustard, MD;  Location: [facility] OR;  Service: Orthopedics;  Laterality: Left;   CATARACT EXTRACTION W/PHACO Left 03/19/2019   Procedure: CATARACT EXTRACTION PHACO AND INTRAOCULAR LENS PLACEMENT (IOC)  LEFT DIABETIC symfony lens;  Surgeon: Brent Crane, MD;  Location: [facility];   Service: Ophthalmology;  Laterality: Left;  Diabetic - oral meds   CATARACT EXTRACTION W/PHACO Right 04/23/2019   Procedure: CATARACT EXTRACTION PHACO AND INTRAOCULAR LENS PLACEMENT (IOC)  RIGHT DIABETIC SYMFONY TORIC LENS;  Surgeon: Brent Crane, MD;  Location: [facility] SURGERY CNTR;  Service: Ophthalmology;  Laterality: Right;  Diabetes-oral med   CYSTOSCOPY WITH LITHOLAPAXY N/A 09/20/2023   Procedure: CYSTOSCOPY WITH LITHOLAPAXY;  Surgeon: Brent Altes, MD;  Location: [facility] ORS;  Service: Urology;  Laterality: N/A;   MENISCUS REPAIR     ROTATOR CUFF REPAIR  10/2015    Home Medications:  Allergies as of 01/06/2024   No Known Allergies      Medication List        Accurate as of January 06, 2024 11:59 PM. If you have any questions, ask your nurse or doctor.          STOP taking these medications    phenazopyridine 200 MG tablet Commonly known as: PYRIDIUM       TAKE these medications    aspirin EC 81 MG tablet Take 81 mg by mouth See admin instructions. Take Sunday-Wednesday Swallow whole.   glipiZIDE 5 MG 24 hr tablet Commonly known as: GLUCOTROL XL Take 5 mg by mouth daily.   metFORMIN 500 MG tablet Commonly known as: GLUCOPHAGE Take 500 mg by mouth 2 (two) times daily with a meal.   metoprolol succinate 50 MG 24 hr tablet Commonly known as: TOPROL-XL Take 50 mg by mouth daily.   nitroGLYCERIN 0.2 mg/hr patch Commonly known as: NITRODUR - Dosed in mg/24 hr PLACE 1 PATCH (0.2 MG TOTAL) ONTO THE SKIN DAILY. What changed:  when to take this reasons to take this   pioglitazone 30 MG tablet Commonly known as: ACTOS Take 30 mg by mouth daily.   rosuvastatin 5 MG tablet Commonly known as: CRESTOR Take 5 mg by mouth daily.   tamsulosin 0.4 MG Caps capsule Commonly known as: FLOMAX Take 1 capsule (0.4 mg total) by mouth daily.   Vitamin B-12 5000 MCG Subl Place 5,000 mcg under the tongue daily.        Allergies: No Known Allergies  Family  History: Family History  Problem Relation Age of Onset   Diabetes Mother    Diabetes Father     Social History:  reports that he has never smoked. He has never used smokeless tobacco. He reports that he does not currently use alcohol. He reports that he does not use drugs.  ROS: Pertinent ROS in HPI  Physical Exam: BP (!) 156/78   Pulse 76   Ht 6' (1.829 m)   Wt 229 lb (103.9 kg)   BMI 31.06 kg/m   Constitutional:  Well nourished. Alert and oriented, No acute distress. HEENT: [place] AT, moist mucus membranes.  Trachea midline Cardiovascular: No clubbing, cyanosis, or edema. Respiratory: Normal respiratory effort, no increased work of breathing. Neurologic: Grossly intact, no focal deficits, moving all 4 extremities.  Psychiatric: Normal mood and affect.   Laboratory Data: ontains abnormal data CBC w/auto Differential (5 Part) Order: [id number] Component Ref Range & Units 13 d ago  WBC (White Blood Cell Count) 4.1 - 10.2 10^3/uL 8.1  RBC (Red Blood Cell Count) 4.69 - 6.13 10^6/uL 4.67 Low   Hemoglobin 14.1 - 18.1 gm/dL 40.3  Hematocrit 47.4 - 52.0 % 43.1  MCV (Mean Corpuscular Volume) 80.0 - 100.0 fl 92.3  MCH (Mean Corpuscular Hemoglobin) 27.0 - 31.2 pg 30.4  MCHC (Mean Corpuscular Hemoglobin Concentration) 32.0 - 36.0 gm/dL 25.9  Platelet Count 563 - 450 10^3/uL 193  RDW-CV (Red Cell Distribution Width) 11.6 - 14.8 % 15.8 High   MPV (Mean Platelet Volume) 9.4 - 12.4 fl 8.8 Low   Neutrophils 1.50 - 7.80 10^3/uL 5.33  Lymphocytes 1.00 - 3.60 10^3/uL 1.48  Monocytes 0.00 - 1.50 10^3/uL 0.75  Eosinophils 0.00 - 0.55 10^3/uL 0.29  Basophils 0.00 - 0.09 10^3/uL 0.11 High   Neutrophil % 32.0 - 70.0 % 65.6  Lymphocyte % 10.0 - 50.0 % 18.2  Monocyte % 4.0 - 13.0 % 9.2  Eosinophil % 1.0 - 5.0 % 3.6  Basophil% 0.0 - 2.0 % 1.4  Immature Granulocyte % <=0.7 % 2 High   Immature Granulocyte Count <=0.06 10^3/L 0.16 High   Resulting Agency [facility] -  LAB   Specimen Collected: 12/23/23 07:29   Performed by: [facility] Last Resulted: 12/23/23 09:37  Received From: [facility]  Result Received: 12/25/23 22:25    Comprehensive Metabolic Panel (CMP) Order: [id number] Component Ref Range & Units 13 d ago  Glucose 70 - 110 mg/dL 518 High   Sodium 841 - 145 mmol/L 140  Potassium 3.6 - 5.1 mmol/L 4.6  Chloride 97 - 109 mmol/L 106  Carbon Dioxide (CO2) 22.0 - 32.0 mmol/L 27.3  Urea Nitrogen (BUN) 7 - 25 mg/dL 15  Creatinine 0.7 - 1.3 mg/dL 1.2  Glomerular Filtration Rate (eGFR) >60 mL/min/1.73sq m 66  Comment: CKD-EPI (2021) does not include patient's race in the calculation of eGFR.  Monitoring changes of plasma creatinine and eGFR over time is useful for monitoring kidney function.  Interpretive Ranges for eGFR (CKD-EPI 2021):  eGFR:       >60 mL/min/1.73 sq. m - Normal eGFR:       30-59 mL/min/1.73 sq. m - Moderately Decreased eGFR:       15-29 mL/min/1.73 sq. m  - Severely Decreased eGFR:       < 15 mL/min/1.73 sq. m  - Kidney Failure   Note: These eGFR calculations do not apply in acute situations when eGFR is changing rapidly or patients on dialysis.  Calcium 8.7 - 10.3 mg/dL 8.9  AST 8 - 39 U/L 11  ALT 6 - 57 U/L 12  Alk Phos (alkaline Phosphatase) 34 - 104 U/L 75  Albumin 3.5 - 4.8 g/dL 4.2  Bilirubin, Total 0.3 - 1.2 mg/dL 0.5  Protein, Total 6.1 - 7.9 g/dL 6.7  A/G Ratio 1.0 - 5.0 gm/dL 1.7  Resulting Agency [facility] - [facility]   Specimen Collected: 12/23/23 07:29   Performed by: [facility] Last Resulted: 12/23/23 11:22  Received From: [facility]  Result Received: 12/25/23 22:25   Hemoglobin A1C Order: [id number] Component Ref Range & Units 13 d ago  Hemoglobin A1C 4.2 - 5.6 % 7.7 High   Average Blood Glucose (Calc) mg/dL 109  Resulting Agency [facility] -  LAB  Narrative Performed by [facility] -  LAB Normal Range:    4.2 - 5.6% Increased Risk:  5.7 - 6.4% Diabetes:        >= 6.5% Glycemic Control for adults with diabetes:  <7%    Specimen Collected: 12/23/23 07:29   Performed by: [facility] Last Resulted: 12/23/23 08:42  Received From: [facility]  Result Received: 12/25/23 22:25   Lipid Panel w/calc LDL Order: [id number] Component Ref Range & Units 13 d ago  Cholesterol, Total 100 - 200 mg/dL 846  Triglyceride 35 - 199 mg/dL 962  HDL (High Density Lipoprotein) Cholesterol 29.0 - 71.0 mg/dL 95.2  LDL Calculated 0 - 130 mg/dL 62  VLDL Cholesterol mg/dL 32  Cholesterol/HDL Ratio 3.7  Resulting Agency [facility] LAB   Specimen Collected: 12/23/23 07:29   Performed by: [facility] Last Resulted: 12/23/23 11:21  Received From: [facility]  Result Received: 12/25/23 22:25    PSA, Total (Screen) Order: [id number] Component Ref Range & Units 13 d ago  PSA (Prostate Specific Antigen), Total 0.10 - 4.00 ng/mL 0.72  Resulting Agency [facility]  Narrative Performed by [facility] - [facility] Test results were determined with Beck[employer] Coulter Hybritech Assay. Values obtained with different assay methods cannot be used interchangeably in serial testing. Assay results should not be interpreted as absolute evidence of the presence or absence of malignant disease  Specimen Collected: 12/23/23 07:29   Performed by: [facility] Last Resulted: 12/23/23 11:20  Received From: [facility]  Result Received: 12/25/23 22:25   Thyroid Stimulating-Hormone (TSH) Order: [id number] Component Ref Range & Units 13 d ago  Thyroid Stimulating Hormone (TSH) 0.450-5.330 uIU/ml uIU/mL 2.257  Resulting Agency [facility] - LAB   Specimen Collected: 12/23/23 07:29   Performed by: [facility] Last Resulted: 12/23/23 11:20  Received From: [facility]  Result Received: 12/25/23 22:25   Urinalysis See EPIC and HPI  I have reviewed the labs.   Pertinent Imaging: N/A  Assessment & Plan:    1. Gross hematuria -Likely secondary to BPH -UA w/ micro heme -Urine sent for culture -if positive, will treat with culture appropriate antibiotics and recheck -if negative, will need to repeat cysto  2. BPH with LUTS -UA w/ micro heme  -PVR < 300 cc  -continue conservative management, avoiding bladder irritants and timed voiding's -Continue tamsulosin 0.4 mg daily  3.  Nephrolithiasis -asymptomatic   Return for pending urine culture results .  These notes generated with voice recognition software. I apologize for typographical errors.  Cloretta Ned  [facility] [address]  [address] [place], [place] [address] [phone]

## 2024-01-06 ENCOUNTER — Ambulatory Visit (INDEPENDENT_AMBULATORY_CARE_PROVIDER_SITE_OTHER): Admitting: Urology

## 2024-01-06 VITALS — BP 156/78 | HR 76 | Ht 72.0 in | Wt 229.0 lb

## 2024-01-06 DIAGNOSIS — R310 Gross hematuria: Secondary | ICD-10-CM | POA: Diagnosis not present

## 2024-01-06 DIAGNOSIS — N401 Enlarged prostate with lower urinary tract symptoms: Secondary | ICD-10-CM | POA: Diagnosis not present

## 2024-01-06 DIAGNOSIS — N200 Calculus of kidney: Secondary | ICD-10-CM | POA: Diagnosis not present

## 2024-01-06 DIAGNOSIS — N138 Other obstructive and reflux uropathy: Secondary | ICD-10-CM

## 2024-01-06 LAB — URINALYSIS, COMPLETE
Bilirubin, UA: NEGATIVE
Ketones, UA: NEGATIVE
Leukocytes,UA: NEGATIVE
Nitrite, UA: NEGATIVE
Specific Gravity, UA: 1.025 (ref 1.005–1.030)
Urobilinogen, Ur: 1 mg/dL (ref 0.2–1.0)
pH, UA: 5.5 (ref 5.0–7.5)

## 2024-01-06 LAB — MICROSCOPIC EXAMINATION
RBC, Urine: >30 /HPF — AB (ref 0–2)
WBC, UA: >30 /HPF — AB (ref 0–5)

## 2024-01-08 ENCOUNTER — Encounter: Payer: Self-pay | Admitting: Urology

## 2024-01-09 LAB — CULTURE, URINE COMPREHENSIVE

## 2024-02-13 ENCOUNTER — Ambulatory Visit (INDEPENDENT_AMBULATORY_CARE_PROVIDER_SITE_OTHER): Admitting: Orthopedic Surgery

## 2024-02-13 ENCOUNTER — Encounter: Payer: Self-pay | Admitting: Orthopedic Surgery

## 2024-02-13 DIAGNOSIS — Z89432 Acquired absence of left foot: Secondary | ICD-10-CM | POA: Diagnosis not present

## 2024-02-13 DIAGNOSIS — L97511 Non-pressure chronic ulcer of other part of right foot limited to breakdown of skin: Secondary | ICD-10-CM

## 2024-02-13 NOTE — Progress Notes (Signed)
 Office Visit Note   Patient: Brent Lam           Date of Birth: 1956-09-28           MRN: [id number] Visit Date: 02/13/2024              Requested by: Sari Cunning, MD [phone] [facility] [address] [facility] [place],  [place] [address] PCP: Sari Cunning, MD  Chief Complaint  Patient presents with   Left Foot - Follow-up    Hx TMA   Right Foot - Follow-up      HPI: Patient is a 68 year old gentleman who presents in follow-up for Wagner grade 1 ulcer second metatarsal head right foot status post left transmetatarsal amputation.  Assessment & Plan: Visit Diagnoses:  1. History of transmetatarsal amputation of left foot (HCC)   2. Non-pressure chronic ulcer of other part of right foot limited to breakdown of skin (HCC)     Plan: Ulcer debrided right foot second metatarsal head.  Patient wants to plan for surgical intervention with Weil osteotomies in December.  Follow-Up Instructions: Return in about 4 weeks (around 03/12/2024).   Ortho Exam  Patient is alert, oriented, no adenopathy, well-dressed, normal affect, normal respiratory effort. Examination patient has a stable left transmetatarsal amputation.  On the right foot patient has a Wagner grade 1 ulcer beneath the second metatarsal head.  There is no redness no swelling no cellulitis or drainage.  After informed consent a 10 blade knife was used debride the skin and soft tissue back to healthy viable granulation tissue.  This was touched with silver  nitrate.  This does not probe to bone or tendon.  The ulcer is 1 cm in diameter after debridement.  Plan to reevaluate in 6 weeks.  Imaging: No results found. No images are attached to the encounter.  Labs: Lab Results  Component Value Date   HGBA1C 7.1 (H) 01/21/2020   REPTSTATUS 05/21/2020 FINAL 05/19/2020   GRAMSTAIN NO WBC SEEN RARE GRAM POSITIVE COCCI  01/23/2020   CULT 50,000 COLONIES/mL STAPHYLOCOCCUS HAEMOLYTICUS (A) 05/19/2020   LABORGA  STAPHYLOCOCCUS HAEMOLYTICUS (A) 05/19/2020     Lab Results  Component Value Date   ALBUMIN 3.7 01/21/2020    No results found for: "MG" No results found for: "VD25OH"  No results found for: "PREALBUMIN"    Latest Ref Rng & Units 01/29/2020    1:36 AM 01/28/2020    5:43 AM 01/26/2020    8:42 AM  CBC EXTENDED  WBC 4.0 - 10.5 K/uL 11.9  11.3  13.2   RBC 4.22 - 5.81 MIL/uL 4.43  4.36  4.45   Hemoglobin 13.0 - 17.0 g/dL 78.2  95.6  21.3   HCT 39.0 - 52.0 % 39.9  39.3  40.4   Platelets 150 - 400 K/uL 355  318  280      There is no height or weight on file to calculate BMI.  Orders:  No orders of the defined types were placed in this encounter.  No orders of the defined types were placed in this encounter.    Procedures: No procedures performed  Clinical Data: No additional findings.  ROS:  All other systems negative, except as noted in the HPI. Review of Systems  Objective: Vital Signs: There were no vitals taken for this visit.  Specialty Comments:  No specialty comments available.  PMFS History: Patient Active Problem List   Diagnosis Date Noted   Gangrene of toe of left foot (  HCC)    PVD (peripheral vascular disease) (HCC)    CKD (chronic kidney disease) stage 3, GFR 30-59 ml/min (HCC) 01/21/2020   HTN (hypertension) 01/21/2020   DM2 (diabetes mellitus, type 2) (HCC) 01/21/2020   Leukocytosis 01/21/2020   Sepsis (HCC) 01/21/2020   Osteomyelitis of toe of left foot (HCC) 01/21/2020   Osteoarthritis of toe joint, left 01/21/2020   Past Medical History:  Diagnosis Date   Abscess of left lower leg 2024   Chronic kidney disease    CKD (chronic kidney disease) stage 3, GFR 30-59 ml/min (HCC)    Diabetes mellitus, type 2 (HCC)    diet controlled   History of kidney stones    X2 4or 5 yrs ago   Hypertension    controlled on meds   Leukocytosis    Peripheral vascular disease (HCC)     Family History  Problem Relation Age of Onset   Diabetes Mother     Diabetes Father     Past Surgical History:  Procedure Laterality Date   ABDOMINAL AORTOGRAM W/LOWER EXTREMITY Bilateral 01/28/2020   Procedure: ABDOMINAL AORTOGRAM W/LOWER EXTREMITY;  Surgeon: Adine Hoof, MD;  Location: [facility] INVASIVE CV LAB;  Service: Cardiovascular;  Laterality: Bilateral;   AMPUTATION Left 01/23/2020   Procedure: TRANSMETATARSAL AMPUTATION FOOT;  Surgeon: Timothy Ford, MD;  Location: [facility] OR;  Service: Orthopedics;  Laterality: Left;   CATARACT EXTRACTION W/PHACO Left 03/19/2019   Procedure: CATARACT EXTRACTION PHACO AND INTRAOCULAR LENS PLACEMENT (IOC)  LEFT DIABETIC symfony lens;  Surgeon: Rosa College, MD;  Location: [facility]GERY [facility]TR;  Service: Ophthalmology;  Laterality: Left;  Diabetic - oral meds   CATARACT EXTRACTION W/PHACO Right 04/23/2019   Procedure: CATARACT EXTRACTION PHACO AND INTRAOCULAR LENS PLACEMENT (IOC)  RIGHT DIABETIC SYMFONY TORIC LENS;  Surgeon: Rosa College, MD;  Location: [facility];  Service: Ophthalmology;  Laterality: Right;  Diabetes-oral med   CYSTOSCOPY WITH LITHOLAPAXY N/A 09/20/2023   Procedure: CYSTOSCOPY WITH LITHOLAPAXY;  Surgeon: Geraline Knapp, MD;  Location: [facility] ORS;  Service: Urology;  Laterality: N/A;   MENISCUS REPAIR     ROTATOR CUFF REPAIR  10/2015   Social History   Occupational History   Not on file  Tobacco Use   Smoking status: Never   Smokeless tobacco: Never  Vaping Use   Vaping status: Never Used  Substance and Sexual Activity   Alcohol use: Not Currently   Drug use: Never   Sexual activity: Not on file

## 2024-03-26 ENCOUNTER — Encounter: Payer: Self-pay | Admitting: Orthopedic Surgery

## 2024-03-26 ENCOUNTER — Ambulatory Visit (INDEPENDENT_AMBULATORY_CARE_PROVIDER_SITE_OTHER): Admitting: Orthopedic Surgery

## 2024-03-26 DIAGNOSIS — L97511 Non-pressure chronic ulcer of other part of right foot limited to breakdown of skin: Secondary | ICD-10-CM | POA: Diagnosis not present

## 2024-03-26 NOTE — Progress Notes (Signed)
 Office Visit Note   Patient: Brent Lam           Date of Birth: 05-16-1956           MRN: [id number] Visit Date: 03/26/2024              Requested by: Cleotilde Oneil FALCON, MD [phone] [facility] [address] [facility] [place],  [place] [address] PCP: Cleotilde Oneil FALCON, MD  Chief Complaint  Patient presents with   Right Foot - Wound Check      HPI: Patient is a 68 year old gentleman is seen in follow-up for [facility] grade 1 ulcer right foot.  Patient is actively working on a farm and would like to proceed with surgical intervention once his work demands have decreased.  Assessment & Plan: Visit Diagnoses:  1. Non-pressure chronic ulcer of other part of right foot limited to breakdown of skin (HCC)     Plan: Ulcer was debrided patient will continue with pressure offloading.  Follow-Up Instructions: Return in about 4 weeks (around 04/23/2024).   Ortho Exam  Patient is alert, oriented, no adenopathy, well-dressed, normal affect, normal respiratory effort. Examination patient has a Wagner grade 1 ulcer plantar aspect the right foot.  After informed consent a 10 blade knife was used to debride the skin and soft tissue back to healthy viable granulation tissue.  This was touched with silver  nitrate a Band-Aid was applied.  There is no tunneling no exposed bone or tendon.    Imaging: No results found. No images are attached to the encounter.  Labs: Lab Results  Component Value Date   HGBA1C 7.1 (H) 01/21/2020   REPTSTATUS 05/21/2020 FINAL 05/19/2020   GRAMSTAIN NO WBC SEEN RARE GRAM POSITIVE COCCI  01/23/2020   CULT 50,000 COLONIES/mL STAPHYLOCOCCUS HAEMOLYTICUS (A) 05/19/2020   LABORGA STAPHYLOCOCCUS HAEMOLYTICUS (A) 05/19/2020     Lab Results  Component Value Date   ALBUMIN 3.7 01/21/2020    No results found for: MG No results found for: VD25OH  No results found for: PREALBUMIN    Latest Ref Rng & Units 01/29/2020    1:36 AM 01/28/2020    5:43 AM  01/26/2020    8:42 AM  CBC EXTENDED  WBC 4.0 - 10.5 K/uL 11.9  11.3  13.2   RBC 4.22 - 5.81 MIL/uL 4.43  4.36  4.45   Hemoglobin 13.0 - 17.0 g/dL 86.8  87.0  86.8   HCT 39.0 - 52.0 % 39.9  39.3  40.4   Platelets 150 - 400 K/uL 355  318  280      There is no height or weight on file to calculate BMI.  Orders:  No orders of the defined types were placed in this encounter.  No orders of the defined types were placed in this encounter.    Procedures: No procedures performed  Clinical Data: No additional findings.  ROS:  All other systems negative, except as noted in the HPI. Review of Systems  Objective: Vital Signs: There were no vitals taken for this visit.  Specialty Comments:  No specialty comments available.  PMFS History: Patient Active Problem List   Diagnosis Date Noted   Gangrene of toe of left foot (HCC)    PVD (peripheral vascular disease) (HCC)    CKD (chronic kidney disease) stage 3, GFR 30-59 ml/min (HCC) 01/21/2020   HTN (hypertension) 01/21/2020   DM2 (diabetes mellitus, type 2) (HCC) 01/21/2020   Leukocytosis 01/21/2020   Sepsis (HCC) 01/21/2020   Osteomyelitis of  toe of left foot (HCC) 01/21/2020   Osteoarthritis of toe joint, left 01/21/2020   Past Medical History:  Diagnosis Date   Abscess of left lower leg 2024   Chronic kidney disease    CKD (chronic kidney disease) stage 3, GFR 30-59 ml/min (HCC)    Diabetes mellitus, type 2 (HCC)    diet controlled   History of kidney stones    X2 4or 5 yrs ago   Hypertension    controlled on meds   Leukocytosis    Peripheral vascular disease (HCC)     Family History  Problem Relation Age of Onset   Diabetes Mother    Diabetes Father     Past Surgical History:  Procedure Laterality Date   ABDOMINAL AORTOGRAM W/LOWER EXTREMITY Bilateral 01/28/2020   Procedure: ABDOMINAL AORTOGRAM W/LOWER EXTREMITY;  Surgeon: Sheree Penne Bruckner, MD;  Location: [facility] INVASIVE CV LAB;  Service: Cardiovascular;   Laterality: Bilateral;   AMPUTATION Left 01/23/2020   Procedure: TRANSMETATARSAL AMPUTATION FOOT;  Surgeon: Harden Jerona GAILS, MD;  Location: [facility] OR;  Service: Orthopedics;  Laterality: Left;   CATARACT EXTRACTION W/PHACO Left 03/19/2019   Procedure: CATARACT EXTRACTION PHACO AND INTRAOCULAR LENS PLACEMENT (IOC)  LEFT DIABETIC symfony lens;  Surgeon: Myrna Adine Anes, MD;  Location: [facility];  Service: Ophthalmology;  Laterality: Left;  Diabetic - oral meds   CATARACT EXTRACTION W/PHACO Right 04/23/2019   Procedure: CATARACT EXTRACTION PHACO AND INTRAOCULAR LENS PLACEMENT (IOC)  RIGHT DIABETIC SYMFONY TORIC LENS;  Surgeon: Myrna Adine Anes, MD;  Location: [facility] SUR[facility]ERY [facility];  Service: Ophthalmology;  Laterality: Right;  Diabetes-oral med   CYSTOSCOPY WITH LITHOLAPAXY N/A 09/20/2023   Procedure: CYSTOSCOPY WITH LITHOLAPAXY;  Surgeon: Twylla Glendia BROCKS, MD;  Location: [facility] ORS;  Service: Urology;  Laterality: N/A;   MENISCUS REPAIR     ROTATOR CUFF REPAIR  10/2015   Social History   Occupational History   Not on file  Tobacco Use   Smoking status: Never   Smokeless tobacco: Never  Vaping Use   Vaping status: Never Used  Substance and Sexual Activity   Alcohol use: Not Currently   Drug use: Never   Sexual activity: Not on file

## 2024-04-23 ENCOUNTER — Encounter: Payer: Self-pay | Admitting: Physician Assistant

## 2024-04-23 ENCOUNTER — Ambulatory Visit (INDEPENDENT_AMBULATORY_CARE_PROVIDER_SITE_OTHER): Admitting: Physician Assistant

## 2024-04-23 DIAGNOSIS — L97511 Non-pressure chronic ulcer of other part of right foot limited to breakdown of skin: Secondary | ICD-10-CM | POA: Diagnosis not present

## 2024-04-23 NOTE — Progress Notes (Addendum)
 Office Visit Note   Patient: Brent Lam           Date of Birth: [birth year]           MRN: [id number] Visit Date: 04/23/2024              Requested by: Cleotilde Oneil FALCON, MD [phone] [facility] [address] [facility] [place],  [place] [address] PCP: Cleotilde Oneil FALCON, MD  Chief Complaint  Patient presents with   Right Foot - Wound Check      HPI: Patient is a 68 year old gentleman is seen in follow-up for [facility] grade 1 ulcer right foot. Patient is actively working on a farm.  He is here for follow up exam of the right foot.  He denies fever and chills.  Assessment & Plan: Visit Diagnoses:  1. Non-pressure chronic ulcer of other part of right foot limited to breakdown of skin (HCC)     Plan: continue to off load the forefoot, stretch the achilles and wash with soap and water .  He received 2 donuts to off load the pressure on the right foot ulcer.    Follow-Up Instructions: Return in about 5 weeks (around 05/28/2024).   Ortho Exam  Patient is alert, oriented, no adenopathy, well-dressed, normal affect, normal respiratory effort. 1 cm wound with granulation after 10 blade debridement.  No cellulitis or active drainage.      Imaging: No results found. No images are attached to the encounter.   Labs: Lab Results  Component Value Date   HGBA1C 7.1 (H) 01/21/2020   REPTSTATUS 05/21/2020 FINAL 05/19/2020   GRAMSTAIN NO WBC SEEN RARE GRAM POSITIVE COCCI  01/23/2020   CULT 50,000 COLONIES/mL STAPHYLOCOCCUS HAEMOLYTICUS (A) 05/19/2020   LABORGA STAPHYLOCOCCUS HAEMOLYTICUS (A) 05/19/2020     Lab Results  Component Value Date   ALBUMIN 3.7 01/21/2020    No results found for: MG No results found for: VD25OH  No results found for: PREALBUMIN    Latest Ref Rng & Units 01/29/2020    1:36 AM 01/28/2020    5:43 AM 01/26/2020    8:42 AM  CBC EXTENDED  WBC 4.0 - 10.5 K/uL 11.9  11.3  13.2   RBC 4.22 - 5.81 MIL/uL 4.43  4.36  4.45   Hemoglobin 13.0 - 17.0  g/dL 86.8  87.0  86.8   HCT 39.0 - 52.0 % 39.9  39.3  40.4   Platelets 150 - 400 K/uL 355  318  280      There is no height or weight on file to calculate BMI.  Orders:  No orders of the defined types were placed in this encounter.  No orders of the defined types were placed in this encounter.    Procedures: No procedures performed  Clinical Data: No additional findings.  ROS:  All other systems negative, except as noted in the HPI. Review of Systems  Objective: Vital Signs: There were no vitals taken for this visit.  Specialty Comments:  No specialty comments available.  PMFS History: Patient Active Problem List   Diagnosis Date Noted   Gangrene of toe of left foot (HCC)    PVD (peripheral vascular disease) (HCC)    CKD (chronic kidney disease) stage 3, GFR 30-59 ml/min (HCC) 01/21/2020   HTN (hypertension) 01/21/2020   DM2 (diabetes mellitus, type 2) (HCC) 01/21/2020   Leukocytosis 01/21/2020   Sepsis (HCC) 01/21/2020   Osteomyelitis of toe of left foot (HCC) 01/21/2020   Osteoarthritis of toe joint, left  01/21/2020   Past Medical History:  Diagnosis Date   Abscess of left lower leg 2024   Chronic kidney disease    CKD (chronic kidney disease) stage 3, GFR 30-59 ml/min (HCC)    Diabetes mellitus, type 2 (HCC)    diet controlled   History of kidney stones    X2 4or 5 yrs ago   Hypertension    controlled on meds   Leukocytosis    Peripheral vascular disease (HCC)     Family History  Problem Relation Age of Onset   Diabetes Mother    Diabetes Father     Past Surgical History:  Procedure Laterality Date   ABDOMINAL AORTOGRAM W/LOWER EXTREMITY Bilateral 01/28/2020   Procedure: ABDOMINAL AORTOGRAM W/LOWER EXTREMITY;  Surgeon: Sheree Penne Bruckner, MD;  Location: [facility] INVASIVE CV LAB;  Service: Cardiovascular;  Laterality: Bilateral;   AMPUTATION Left 01/23/2020   Procedure: TRANSMETATARSAL AMPUTATION FOOT;  Surgeon: Harden Jerona GAILS, MD;  Location: [facility]  OR;  Service: Orthopedics;  Laterality: Left;   CATARACT EXTRACTION W/PHACO Left 03/19/2019   Procedure: CATARACT EXTRACTION PHACO AND INTRAOCULAR LENS PLACEMENT (IOC)  LEFT DIABETIC symfony lens;  Surgeon: Myrna Adine Anes, MD;  Location: [facility];  Service: Ophthalmology;  Laterality: Left;  Diabetic - oral meds   CATARACT EXTRACTION W/PHACO Right 04/23/2019   Procedure: CATARACT EXTRACTION PHACO AND INTRAOCULAR LENS PLACEMENT (IOC)  RIGHT DIABETIC SYMFONY TORIC LENS;  Surgeon: Myrna Adine Anes, MD;  Location: [facility]GERY [facility];  Service: Ophthalmology;  Laterality: Right;  Diabetes-oral med   CYSTOSCOPY WITH LITHOLAPAXY N/A 09/20/2023   Procedure: CYSTOSCOPY WITH LITHOLAPAXY;  Surgeon: Twylla Glendia BROCKS, MD;  Location: [facility] ORS;  Service: Urology;  Laterality: N/A;   MENISCUS REPAIR     ROTATOR CUFF REPAIR  10/2015   Social History   Occupational History   Not on file  Tobacco Use   Smoking status: Never   Smokeless tobacco: Never  Vaping Use   Vaping status: Never Used  Substance and Sexual Activity   Alcohol use: Not Currently   Drug use: Never   Sexual activity: Not on file

## 2024-05-03 ENCOUNTER — Other Ambulatory Visit: Payer: Self-pay | Admitting: Internal Medicine

## 2024-05-03 DIAGNOSIS — R1084 Generalized abdominal pain: Secondary | ICD-10-CM

## 2024-05-03 DIAGNOSIS — E1165 Type 2 diabetes mellitus with hyperglycemia: Secondary | ICD-10-CM

## 2024-05-03 DIAGNOSIS — R310 Gross hematuria: Secondary | ICD-10-CM

## 2024-05-25 ENCOUNTER — Telehealth: Payer: Self-pay | Admitting: Urology

## 2024-05-25 NOTE — Telephone Encounter (Signed)
 Please let Brent Lam know that we need to get him in for cystoscopy.  Is tobacco season over yet?

## 2024-05-25 NOTE — Telephone Encounter (Signed)
 Called patient no answer left a message for him to call back

## 2024-06-07 ENCOUNTER — Ambulatory Visit (INDEPENDENT_AMBULATORY_CARE_PROVIDER_SITE_OTHER): Admitting: Orthopedic Surgery

## 2024-06-07 ENCOUNTER — Encounter: Payer: Self-pay | Admitting: Orthopedic Surgery

## 2024-06-07 DIAGNOSIS — L97511 Non-pressure chronic ulcer of other part of right foot limited to breakdown of skin: Secondary | ICD-10-CM

## 2024-06-07 NOTE — Progress Notes (Signed)
 Office Visit Note   Patient: Brent Lam           Date of Birth: [birth year]           MRN: [id number] Visit Date: 06/07/2024              Requested by: Cleotilde Oneil FALCON, MD [phone] [facility] [address] [facility] [place],  [place] [address] PCP: Cleotilde Oneil FALCON, MD  Chief Complaint  Patient presents with   Right Foot - Wound Check      HPI: Discussed the use of AI scribe software for clinical note transcription with the patient, who gave verbal consent to proceed.  History of Present Illness Brent Lam is a 68 year old male who presents for debridement of a Wagner grade 1 ulcer beneath the second metatarsal head.     Assessment & Plan: Visit Diagnoses:  1. Non-pressure chronic ulcer of other part of right foot limited to breakdown of skin (HCC)     Plan: Assessment and Plan Assessment & Plan Wagner grade 1 ulcer beneath second metatarsal head, status post debridement Wagner grade 1 ulcer beneath the second metatarsal head, 1 cm diameter, 1 mm depth, no erythema, cellulitis, or drainage. Debridement performed with ten blade knife to healthy tissue after consent. - Follow up in four weeks for evaluation for repeat debridement.  Planned wild osteotomy of second metatarsal Discussed planned wild osteotomy of the second metatarsal to shorten bone and relieve pressure, preventing ulcer recurrence. Procedure considered for December. - Consider wild osteotomy of the second metatarsal in December.      Follow-Up Instructions: Return in about 4 weeks (around 07/05/2024).   Ortho Exam  Patient is alert, oriented, no adenopathy, well-dressed, normal affect, normal respiratory effort. Physical Exam CARDIOVASCULAR: Dorsalis pedis pulse present. SKIN: Wagner grade 1 ulcer beneath the second metatarsal head with no redness, cellulitis, or drainage.      Imaging: No results found. No images are attached to the encounter.  Labs: Lab Results  Component Value  Date   HGBA1C 7.1 (H) 01/21/2020   REPTSTATUS 05/21/2020 FINAL 05/19/2020   GRAMSTAIN NO WBC SEEN RARE GRAM POSITIVE COCCI  01/23/2020   CULT 50,000 COLONIES/mL STAPHYLOCOCCUS HAEMOLYTICUS (A) 05/19/2020   LABORGA STAPHYLOCOCCUS HAEMOLYTICUS (A) 05/19/2020     Lab Results  Component Value Date   ALBUMIN 3.7 01/21/2020    No results found for: MG No results found for: VD25OH  No results found for: PREALBUMIN    Latest Ref Rng & Units 01/29/2020    1:36 AM 01/28/2020    5:43 AM 01/26/2020    8:42 AM  CBC EXTENDED  WBC 4.0 - 10.5 K/uL 11.9  11.3  13.2   RBC 4.22 - 5.81 MIL/uL 4.43  4.36  4.45   Hemoglobin 13.0 - 17.0 g/dL 86.8  87.0  86.8   HCT 39.0 - 52.0 % 39.9  39.3  40.4   Platelets 150 - 400 K/uL 355  318  280      There is no height or weight on file to calculate BMI.  Orders:  No orders of the defined types were placed in this encounter.  No orders of the defined types were placed in this encounter.    Procedures: No procedures performed  Clinical Data: No additional findings.  ROS:  All other systems negative, except as noted in the HPI. Review of Systems  Objective: Vital Signs: There were no vitals taken for this visit.  Specialty Comments:  No specialty comments available.  PMFS History: Patient Active Problem List   Diagnosis Date Noted   Gangrene of toe of left foot (HCC)    PVD (peripheral vascular disease) (HCC)    CKD (chronic kidney disease) stage 3, GFR 30-59 ml/min (HCC) 01/21/2020   HTN (hypertension) 01/21/2020   DM2 (diabetes mellitus, type 2) (HCC) 01/21/2020   Leukocytosis 01/21/2020   Sepsis (HCC) 01/21/2020   Osteomyelitis of toe of left foot (HCC) 01/21/2020   Osteoarthritis of toe joint, left 01/21/2020   Past Medical History:  Diagnosis Date   Abscess of left lower leg 2024   Chronic kidney disease    CKD (chronic kidney disease) stage 3, GFR 30-59 ml/min (HCC)    Diabetes mellitus, type 2 (HCC)    diet  controlled   History of kidney stones    X2 4or 5 yrs ago   Hypertension    controlled on meds   Leukocytosis    Peripheral vascular disease (HCC)     Family History  Problem Relation Age of Onset   Diabetes Mother    Diabetes Father     Past Surgical History:  Procedure Laterality Date   ABDOMINAL AORTOGRAM W/LOWER EXTREMITY Bilateral 01/28/2020   Procedure: ABDOMINAL AORTOGRAM W/LOWER EXTREMITY;  Surgeon: Sheree Penne Bruckner, MD;  Location: [facility] INVASIVE CV LAB;  Service: Cardiovascular;  Laterality: Bilateral;   AMPUTATION Left 01/23/2020   Procedure: TRANSMETATARSAL AMPUTATION FOOT;  Surgeon: Harden Jerona GAILS, MD;  Location: [facility] OR;  Service: Orthopedics;  Laterality: Left;   CATARACT EXTRACTION W/PHACO Left 03/19/2019   Procedure: CATARACT EXTRACTION PHACO AND INTRAOCULAR LENS PLACEMENT (IOC)  LEFT DIABETIC symfony lens;  Surgeon: Myrna Adine Anes, MD;  Location: [facility] SURGERY [facility];  Service: Ophthalmology;  Laterality: Left;  Diabetic - oral meds   CATARACT EXTRACTION W/PHACO Right 04/23/2019   Procedure: CATARACT EXTRACTION PHACO AND INTRAOCULAR LENS PLACEMENT (IOC)  RIGHT DIABETIC SYMFONY TORIC LENS;  Surgeon: Myrna Adine Anes, MD;  Location: [facility] SURGERY [facility];  Service: Ophthalmology;  Laterality: Right;  Diabetes-oral med   CYSTOSCOPY WITH LITHOLAPAXY N/A 09/20/2023   Procedure: CYSTOSCOPY WITH LITHOLAPAXY;  Surgeon: Twylla Glendia BROCKS, MD;  Location: [facility] ORS;  Service: Urology;  Laterality: N/A;   MENISCUS REPAIR     ROTATOR CUFF REPAIR  10/2015   Social History   Occupational History   Not on file  Tobacco Use   Smoking status: Never   Smokeless tobacco: Never  Vaping Use   Vaping status: Never Used  Substance and Sexual Activity   Alcohol use: Not Currently   Drug use: Never   Sexual activity: Not on file

## 2024-07-16 ENCOUNTER — Ambulatory Visit (INDEPENDENT_AMBULATORY_CARE_PROVIDER_SITE_OTHER): Admitting: Orthopedic Surgery

## 2024-07-16 DIAGNOSIS — L97511 Non-pressure chronic ulcer of other part of right foot limited to breakdown of skin: Secondary | ICD-10-CM

## 2024-07-16 DIAGNOSIS — Z89432 Acquired absence of left foot: Secondary | ICD-10-CM

## 2024-07-17 ENCOUNTER — Encounter: Payer: Self-pay | Admitting: Orthopedic Surgery

## 2024-07-17 NOTE — Progress Notes (Signed)
 Office Visit Note   Patient: Brent Lam           Date of Birth: Jul 30, 1956           MRN: [id number] Visit Date: 07/16/2024              Requested by: Cleotilde Oneil FALCON, MD [phone] [facility] [address] [facility] [place],  [place] [address] PCP: Cleotilde Oneil FALCON, MD  Chief Complaint  Patient presents with   Right Foot - Follow-up      HPI: Discussed the use of AI scribe software for clinical note transcription with the patient, who gave verbal consent to proceed.  History of Present Illness Brent Lam is a 68 year old male with poor circulation and a history of transmetatarsal amputation who presents for follow-up of foot ulcers.  He has a history of poor circulation, which led to the transmetatarsal amputation of his toes. He recalls being informed about having an unusual number of veins in his foot, possibly from birth. He wears nitrogen patches as part of his management for circulation issues.  He experiences pressure and bruising on his foot, particularly after accidentally kicking it. His current work schedule, which will continue for two more weeks, may be contributing to the pressure on his foot.  He emphasizes the importance of wearing appropriate footwear to reduce pressure on the affected areas.     Assessment & Plan: Visit Diagnoses: No diagnosis found.  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 Right foot: strong dorsalis pedis pulse, 1 cm wound with viable granulation tissue. Left foot: ulcer over residual first metatarsal, no palpable pulse, good microcirculation with brisk petechial bleeding post-debridement, 3 cm wound, flat, no tunneling to bone, no cellulitis or infection. - Debride skin and soft tissue to viable granulation tissue. - Apply silver  nitrate for hemostasis. - Advise wearing protective footwear. - Follow up in 4  weeks.  Peripheral arterial disease of the lower extremities Peripheral arterial disease in both lower extremities, poor circulation in left foot. Adequate microcirculation in left foot evidenced by petechial bleeding post-debridement. - Advise minimizing pressure on affected areas.      Follow-Up Instructions: No follow-ups on file.   Ortho Exam  Patient is alert, oriented, no adenopathy, well-dressed, normal affect, normal respiratory effort. Physical Exam EXTREMITIES: Strong dorsalis pedis pulse on right foot. No palpable pulse on left foot. Flat wound 1 cm diameter with good petechial bleeding on right foot. Ulcer over residual first metatarsal on left foot post transmetatarsal amputation. Flat wound 3 cm diameter with no tunneling to bone on left foot. No cellulitis or signs of infection on left foot.  After informed consent a 10 blade knife was used to debride the skin and soft tissue from the [facility] grade 1 ulcers on both lower extremities.  This was debrided back to flat healthy granulation tissue.  After debridement the wound is 3 cm in diameter on the left foot and 1 cm diameter on the right foot      Imaging: No results found. No images are attached to the encounter.  Labs: Lab Results  Component Value Date   HGBA1C 7.1 (H) 01/21/2020   REPTSTATUS 05/21/2020 FINAL 05/19/2020   GRAMSTAIN NO WBC SEEN RARE GRAM POSITIVE COCCI  01/23/2020   CULT 50,000 COLONIES/mL STAPHYLOCOCCUS HAEMOLYTICUS (A) 05/19/2020   LABORGA STAPHYLOCOCCUS HAEMOLYTICUS (A) 05/19/2020     Lab  Results  Component Value Date   ALBUMIN 3.7 01/21/2020    No results found for: MG No results found for: VD25OH  No results found for: PREALBUMIN    Latest Ref Rng & Units 01/29/2020    1:36 AM 01/28/2020    5:43 AM 01/26/2020    8:42 AM  CBC EXTENDED  WBC 4.0 - 10.5 K/uL 11.9  11.3  13.2   RBC 4.22 - 5.81 MIL/uL 4.43  4.36  4.45   Hemoglobin 13.0 - 17.0 g/dL 86.8  87.0  86.8   HCT 39.0 -  52.0 % 39.9  39.3  40.4   Platelets 150 - 400 K/uL 355  318  280      There is no height or weight on file to calculate BMI.  Orders:  No orders of the defined types were placed in this encounter.  No orders of the defined types were placed in this encounter.    Procedures: No procedures performed  Clinical Data: No additional findings.  ROS:  All other systems negative, except as noted in the HPI. Review of Systems  Objective: Vital Signs: There were no vitals taken for this visit.  Specialty Comments:  No specialty comments available.  PMFS History: Patient Active Problem List   Diagnosis Date Noted   Gangrene of toe of left foot (HCC)    PVD (peripheral vascular disease)    CKD (chronic kidney disease) stage 3, GFR 30-59 ml/min (HCC) 01/21/2020   HTN (hypertension) 01/21/2020   DM2 (diabetes mellitus, type 2) (HCC) 01/21/2020   Leukocytosis 01/21/2020   Sepsis (HCC) 01/21/2020   Osteomyelitis of toe of left foot (HCC) 01/21/2020   Osteoarthritis of toe joint, left 01/21/2020   Past Medical History:  Diagnosis Date   Abscess of left lower leg 2024   Chronic kidney disease    CKD (chronic kidney disease) stage 3, GFR 30-59 ml/min (HCC)    Diabetes mellitus, type 2 (HCC)    diet controlled   History of kidney stones    X2 4or 5 yrs ago   Hypertension    controlled on meds   Leukocytosis    Peripheral vascular disease     Family History  Problem Relation Age of Onset   Diabetes Mother    Diabetes Father     Past Surgical History:  Procedure Laterality Date   ABDOMINAL AORTOGRAM W/LOWER EXTREMITY Bilateral 01/28/2020   Procedure: ABDOMINAL AORTOGRAM W/LOWER EXTREMITY;  Surgeon: Sheree Penne Bruckner, MD;  Location: [facility] INVASIVE CV LAB;  Service: Cardiovascular;  Laterality: Bilateral;   AMPUTATION Left 01/23/2020   Procedure: TRANSMETATARSAL AMPUTATION FOOT;  Surgeon: Harden Jerona GAILS, MD;  Location: [facility] OR;  Service: Orthopedics;  Laterality: Left;    CATARACT EXTRACTION W/PHACO Left 03/19/2019   Procedure: CATARACT EXTRACTION PHACO AND INTRAOCULAR LENS PLACEMENT (IOC)  LEFT DIABETIC symfony lens;  Surgeon: Myrna Adine Anes, MD;  Location: [facility];  Service: Ophthalmology;  Laterality: Left;  Diabetic - oral meds   CATARACT EXTRACTION W/PHACO Right 04/23/2019   Procedure: CATARACT EXTRACTION PHACO AND INTRAOCULAR LENS PLACEMENT (IOC)  RIGHT DIABETIC SYMFONY TORIC LENS;  Surgeon: Myrna Adine Anes, MD;  Location: [facility]ERY [facility];  Service: Ophthalmology;  Laterality: Right;  Diabetes-oral med   CYSTOSCOPY WITH LITHOLAPAXY N/A 09/20/2023   Procedure: CYSTOSCOPY WITH LITHOLAPAXY;  Surgeon: Twylla Glendia BROCKS, MD;  Location: [facility] ORS;  Service: Urology;  Laterality: N/A;   MENISCUS REPAIR     ROTATOR CUFF REPAIR  10/2015   Social History  Occupational History   Not on file  Tobacco Use   Smoking status: Never   Smokeless tobacco: Never  Vaping Use   Vaping status: Never Used  Substance and Sexual Activity   Alcohol use: Not Currently   Drug use: Never   Sexual activity: Not on file

## 2024-08-06 ENCOUNTER — Encounter: Payer: Self-pay | Admitting: Radiology

## 2024-08-13 ENCOUNTER — Ambulatory Visit: Admitting: Orthopedic Surgery

## 2024-08-14 ENCOUNTER — Encounter: Payer: Self-pay | Admitting: Family

## 2024-08-14 ENCOUNTER — Ambulatory Visit: Admitting: Family

## 2024-08-14 DIAGNOSIS — I739 Peripheral vascular disease, unspecified: Secondary | ICD-10-CM | POA: Diagnosis not present

## 2024-08-14 DIAGNOSIS — L97521 Non-pressure chronic ulcer of other part of left foot limited to breakdown of skin: Secondary | ICD-10-CM

## 2024-08-14 DIAGNOSIS — Z89432 Acquired absence of left foot: Secondary | ICD-10-CM

## 2024-08-14 DIAGNOSIS — L97511 Non-pressure chronic ulcer of other part of right foot limited to breakdown of skin: Secondary | ICD-10-CM | POA: Diagnosis not present

## 2024-08-14 DIAGNOSIS — L97512 Non-pressure chronic ulcer of other part of right foot with fat layer exposed: Secondary | ICD-10-CM

## 2024-08-14 NOTE — Progress Notes (Signed)
 Office Visit Note   Patient: Brent Lam           Date of Birth: 02-25-1956           MRN: [id number] Visit Date: 08/14/2024              Requested by: Cleotilde Oneil FALCON, MD [phone] [facility] [address] [facility] [place],  [place] [address] PCP: Cleotilde Oneil FALCON, MD  Chief Complaint  Patient presents with   Left Foot - Wound Check      HPI: The patient is a 68 year old gentleman seen in follow-up for ulcer to his left transmetatarsal amputation as well as beneath his right foot  He reports that he has been having some increased drainage and swelling bilateral feet.  He also reports he is planning for surgery with Dr. Harden this December  Assessment & Plan: Visit Diagnoses: No diagnosis found.  Plan: Will begin silver  cell packing.  Discussed changing dressings and socks frequently to avoid maceration  Follow-Up Instructions: No follow-ups on file.   Ortho Exam  Patient is alert, oriented, no adenopathy, well-dressed, normal affect, normal respiratory effort. On examination left foot the transmetatarsal amputation has ulcer over the medial border this is 3-1/2 x 3 cm with 5 mm of depth there is no exposed bone there is flat pink tissue in the wound bed was debrided of nonviable tissue with a 10 blade knife.  No sign of infection  On examination right foot beneath the third metatarsal head there is Wagner grade 1 ulcer with surrounding hyperkeratotic tissue this was debrided with a 10 blade knife back to viable tissue underlying ulcer is 2 cm in diameter with 5 mm of depth no sign of infection    Imaging: No results found. No images are attached to the encounter.  Labs: Lab Results  Component Value Date   HGBA1C 7.1 (H) 01/21/2020   REPTSTATUS 05/21/2020 FINAL 05/19/2020   GRAMSTAIN NO WBC SEEN RARE GRAM POSITIVE COCCI  01/23/2020   CULT 50,000 COLONIES/mL STAPHYLOCOCCUS HAEMOLYTICUS (A) 05/19/2020   LABORGA STAPHYLOCOCCUS HAEMOLYTICUS (A) 05/19/2020      Lab Results  Component Value Date   ALBUMIN 3.7 01/21/2020    No results found for: MG No results found for: VD25OH  No results found for: PREALBUMIN    Latest Ref Rng & Units 01/29/2020    1:36 AM 01/28/2020    5:43 AM 01/26/2020    8:42 AM  CBC EXTENDED  WBC 4.0 - 10.5 K/uL 11.9  11.3  13.2   RBC 4.22 - 5.81 MIL/uL 4.43  4.36  4.45   Hemoglobin 13.0 - 17.0 g/dL 86.8  87.0  86.8   HCT 39.0 - 52.0 % 39.9  39.3  40.4   Platelets 150 - 400 K/uL 355  318  280      There is no height or weight on file to calculate BMI.  Orders:  No orders of the defined types were placed in this encounter.  No orders of the defined types were placed in this encounter.    Procedures: No procedures performed  Clinical Data: No additional findings.  ROS:  All other systems negative, except as noted in the HPI. Review of Systems  Objective: Vital Signs: There were no vitals taken for this visit.  Specialty Comments:  No specialty comments available.  PMFS History: Patient Active Problem List   Diagnosis Date Noted   Gangrene of toe of left foot (HCC)    PVD (peripheral  vascular disease)    CKD (chronic kidney disease) stage 3, GFR 30-59 ml/min (HCC) 01/21/2020   HTN (hypertension) 01/21/2020   DM2 (diabetes mellitus, type 2) (HCC) 01/21/2020   Leukocytosis 01/21/2020   Sepsis (HCC) 01/21/2020   Osteomyelitis of toe of left foot (HCC) 01/21/2020   Osteoarthritis of toe joint, left 01/21/2020   Past Medical History:  Diagnosis Date   Abscess of left lower leg 2024   Chronic kidney disease    CKD (chronic kidney disease) stage 3, GFR 30-59 ml/min (HCC)    Diabetes mellitus, type 2 (HCC)    diet controlled   History of kidney stones    X2 4or 5 yrs ago   Hypertension    controlled on meds   Leukocytosis    Peripheral vascular disease     Family History  Problem Relation Age of Onset   Diabetes Mother    Diabetes Father     Past Surgical History:   Procedure Laterality Date   ABDOMINAL AORTOGRAM W/LOWER EXTREMITY Bilateral 01/28/2020   Procedure: ABDOMINAL AORTOGRAM W/LOWER EXTREMITY;  Surgeon: Sheree Penne Bruckner, MD;  Location: [facility] INVASIVE CV LAB;  Service: Cardiovascular;  Laterality: Bilateral;   AMPUTATION Left 01/23/2020   Procedure: TRANSMETATARSAL AMPUTATION FOOT;  Surgeon: Harden Jerona GAILS, MD;  Location: [facility] OR;  Service: Orthopedics;  Laterality: Left;   CATARACT EXTRACTION W/PHACO Left 03/19/2019   Procedure: CATARACT EXTRACTION PHACO AND INTRAOCULAR LENS PLACEMENT (IOC)  LEFT DIABETIC symfony lens;  Surgeon: Myrna Adine Anes, MD;  Location: [facility];  Service: Ophthalmology;  Laterality: Left;  Diabetic - oral meds   CATARACT EXTRACTION W/PHACO Right 04/23/2019   Procedure: CATARACT EXTRACTION PHACO AND INTRAOCULAR LENS PLACEMENT (IOC)  RIGHT DIABETIC SYMFONY TORIC LENS;  Surgeon: Myrna Adine Anes, MD;  Location: [facility];  Service: Ophthalmology;  Laterality: Right;  Diabetes-oral med   CYSTOSCOPY WITH LITHOLAPAXY N/A 09/20/2023   Procedure: CYSTOSCOPY WITH LITHOLAPAXY;  Surgeon: Twylla Glendia BROCKS, MD;  Location: [facility] ORS;  Service: Urology;  Laterality: N/A;   MENISCUS REPAIR     ROTATOR CUFF REPAIR  10/2015   Social History   Occupational History   Not on file  Tobacco Use   Smoking status: Never   Smokeless tobacco: Never  Vaping Use   Vaping status: Never Used  Substance and Sexual Activity   Alcohol use: Not Currently   Drug use: Never   Sexual activity: Not on file

## 2024-08-29 NOTE — Progress Notes (Signed)
 Patient Profile:   Brent Lam  is a 68 y.o.  male Chief Complaint  Patient presents with  . Follow-up      PROBLEM LIST: Past Medical History:  Diagnosis Date  . B12 deficiency 12/04/2018   166, 1/20  . Benign essential hypertension 11/21/2018  . Nephrolithiasis 11/21/2018   CT 3/18, right hydroureter  . Type 2 diabetes mellitus without complication, without long-term current use of insulin  (CMS/HHS-HCC) 12/04/2018    Past Surgical History:  Procedure Laterality Date  . ARTHROSCOPIC ROTATOR CUFF REPAIR Right 2017    ALLERGIES: No Known Allergies  CURRENT MEDICATIONS: Current Outpatient Medications  Medication Sig Dispense Refill  . acetaminophen  (TYLENOL ) 500 MG tablet Take by mouth    . cyanocobalamin/cobamamide (B12 SL) Place under the tongue    . glipiZIDE (GLUCOTROL XL) 5 MG XL tablet Take 1 tablet (5 mg total) by mouth once daily 90 tablet 3  . metFORMIN (GLUCOPHAGE) 500 MG tablet Take 1 tablet (500 mg total) by mouth 2 (two) times daily with meals 180 tablet 3  . metoprolol  SUCCinate (TOPROL -XL) 50 MG XL tablet Take 1 tablet (50 mg total) by mouth once daily 90 tablet 3  . pioglitazone (ACTOS) 30 MG tablet Take 1 tablet (30 mg total) by mouth once daily 90 tablet 3  . rosuvastatin (CRESTOR) 5 MG tablet Take 1 tablet (5 mg total) by mouth once daily 90 tablet 3  . tirzepatide (ZEPBOUND) 2.5 mg/0.5 mL injection Inject 0.5 mLs (2.5 mg total) subcutaneously every 7 (seven) days 2 mL 12  . blood glucose diagnostic test strip Use 1 each (1 strip total) once daily Use as instructed. (Patient taking differently: Use 1 strip once daily Use as instructed.twice a week) 100 each 12  . blood glucose meter kit Use as directed 1 each 0  . lancets Use 1 each once daily Use as instructed. (Patient taking differently: Use 1 each once daily Use as instructed. Twice a week) 100 each 12   No current facility-administered medications for this visit.       HPI   CLINICAL SUMMARY:  Patient has lost 11 pounds, does not feel as good.  A1c down to 6.4 from 8.4.  No lows.  Going for CT abdomen with his chronic hematuria per urology.  Has chronic hematuria  ROS: Review of systems is unremarkable for any active cardiac, respiratory, GI, GU, hematologic, neurologic, dermatologic, HEENT, or psychiatric symptoms except as noted above, 10 systems reviewed.  No fevers, chills, or constitutional symptoms.   PHYSICAL EXAM  Vital signs:  BP 118/62   Pulse 98   Wt (!) 113.5 kg (250 lb 3.2 oz)   SpO2 95%   BMI 33.93 kg/m  Body mass index is 33.93 kg/m.   Wt Readings from Last 3 Encounters:  08/29/24 (!) 113.5 kg (250 lb 3.2 oz)  05/02/24 (!) 118.9 kg (262 lb 3.2 oz)  01/02/24 (!) 118.8 kg (261 lb 12.8 oz)     BP Readings from Last 3 Encounters:  08/29/24 118/62  05/02/24 (!) 150/70  01/02/24 (!) 140/88    Constitutional:NAD Neck: supple, no thyromegaly, good ROM Respiratory:clear to auscultation, no rales or wheezes Cardiovascular:RRR, no murmur or gallop Abdominal:soft, good BS, NT Ext: no edema, good peripheral pulses Neuro: alert and oriented X 3, grossly nonfocal     ASSESSMENT/PLAN   Poor  feeling-secondary to losing weight, not being very hungry from the Zepbound yet having to work and pull tobacco.  Reduce Zepbound to the fraction of the dose, 1/3 - 1/2 dose weekly to find a balance between weight loss and strength Type 2 diabetes-A1c down 6.1 from 8.4, no apparent lows, no change in medicine, with further weight loss would discontinue the glipizide Foot ulcer-podiatry Chronic hematuria-CT abdomen coming up, let me know 3 days post study to look at the results Obesity-losing weight, down 11 pounds, Zepbound has been helpful, he is very sensitive  Dispo:   Return in about 4 months (around 12/27/2024) for physical.

## 2024-09-17 ENCOUNTER — Ambulatory Visit: Admission: RE | Admit: 2024-09-17 | Discharge: 2024-09-17 | Attending: Internal Medicine | Admitting: Internal Medicine

## 2024-09-17 DIAGNOSIS — R1084 Generalized abdominal pain: Secondary | ICD-10-CM

## 2024-09-17 DIAGNOSIS — E1165 Type 2 diabetes mellitus with hyperglycemia: Secondary | ICD-10-CM | POA: Diagnosis present

## 2024-09-17 DIAGNOSIS — R310 Gross hematuria: Secondary | ICD-10-CM | POA: Insufficient documentation

## 2024-09-17 MED ORDER — IOHEXOL 300 MG/ML  SOLN
100.0000 mL | Freq: Once | INTRAMUSCULAR | Status: AC | PRN
  Administered 2024-09-17: 08:00:00 100 mL via INTRAVENOUS

## 2024-09-20 ENCOUNTER — Ambulatory Visit: Admitting: Orthopedic Surgery

## 2024-09-20 DIAGNOSIS — Z89432 Acquired absence of left foot: Secondary | ICD-10-CM | POA: Diagnosis not present

## 2024-09-20 DIAGNOSIS — L97511 Non-pressure chronic ulcer of other part of right foot limited to breakdown of skin: Secondary | ICD-10-CM | POA: Diagnosis not present

## 2024-09-20 DIAGNOSIS — L97521 Non-pressure chronic ulcer of other part of left foot limited to breakdown of skin: Secondary | ICD-10-CM | POA: Diagnosis not present

## 2024-09-20 DIAGNOSIS — I739 Peripheral vascular disease, unspecified: Secondary | ICD-10-CM

## 2024-09-21 ENCOUNTER — Encounter: Payer: Self-pay | Admitting: Orthopedic Surgery

## 2024-09-21 NOTE — Progress Notes (Signed)
 "  Office Visit Note   Patient: Brent Lam           Date of Birth: Aug 12, 1956           MRN: [id number] Visit Date: 09/20/2024              Requested by: Cleotilde Oneil FALCON, MD [phone] [facility] [address] [facility] [place],  [place] [address] PCP: Cleotilde Oneil FALCON, MD  Chief Complaint  Patient presents with   Left Foot - Wound Check   Right Foot - Wound Check      HPI: Discussed the use of AI scribe software for clinical note transcription with the patient, who gave verbal consent to proceed.  History of Present Illness Brent Lam is a 68 year old male with neuropathy and foot ulcers who presents for management of persistent ulcers on both feet.  He has persistent ulcers on the plantar aspect of both feet. The ulcer on the right foot is slow to heal, and the callus has thickened. He does not use any shoe inserts or pads to alleviate the pressure.  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 On the right foot, there is an ulcer beneath the second metatarsal head. Pain has decreased, and he no longer feels the ulcer when walking, attributed to his neuropathy.  He is concerned about the healing time and inquires about the possibility of surgery, asking how long he would be 'out of commission.'     Assessment & Plan: Visit Diagnoses: No diagnosis found.  Plan: Assessment and Plan Assessment & Plan Bilateral chronic plantar foot ulcers Persistent Wagner grade ulcers on plantar aspect of both feet, exacerbated by pressure and neuropathy. - Debrided skin and soft tissue on both feet using a ten blade knife. - Applied Band-Aid to the wound. - Created and applied felt relieving donut to offload pressure. - Follow up in two weeks to assess healing and discuss further surgical options.  Transmetatarsal amputation of the left foot Transmetatarsal amputation on left foot with  persistent ulcer due to pressure and neuropathy. - Continue pressure offloading measures with felt donut and Band-Aid.  Planned right second metatarsal osteotomy Anticipated need for right second metatarsal osteotomy to offload pressure from ulcerative area. - Schedule right second metatarsal osteotomy after follow-up in two weeks.      Follow-Up Instructions: No follow-ups on file.   Ortho Exam  Patient is alert, oriented, no adenopathy, well-dressed, normal affect, normal respiratory effort. Physical Exam EXTREMITIES: Wagner grade ulcers on the plantar aspect of both feet. Transmetatarsal amputation on the left foot with a Wagner grade 1 ulcer beneath the first metatarsal. Wagner grade 1 ulcer beneath the second metatarsal head on the right foot. Ulcers on both feet measured 1 cm in diameter prior to debridement and 2.5 x 3 cm after debridement.   After informed consent a 10 blade knife was used to debride the skin and soft tissue from the [facility] grade 1 ulcers on the right and left foot.  There was good granulation tissue at the base after debridement.  Silver  nitrate was used for hemostasis.   Imaging: No results found. No images are attached to the encounter.  Labs: Lab Results  Component Value Date   HGBA1C 7.1 (H) 01/21/2020   REPTSTATUS 05/21/2020 FINAL 05/19/2020   GRAMSTAIN NO WBC SEEN RARE GRAM POSITIVE COCCI  01/23/2020   CULT  50,000 COLONIES/mL STAPHYLOCOCCUS HAEMOLYTICUS (A) 05/19/2020   LABORGA STAPHYLOCOCCUS HAEMOLYTICUS (A) 05/19/2020     Lab Results  Component Value Date   ALBUMIN 3.7 01/21/2020    No results found for: MG No results found for: VD25OH  No results found for: PREALBUMIN    Latest Ref Rng & Units 01/29/2020    1:36 AM 01/28/2020    5:43 AM 01/26/2020    8:42 AM  CBC EXTENDED  WBC 4.0 - 10.5 K/uL 11.9  11.3  13.2   RBC 4.22 - 5.81 MIL/uL 4.43  4.36  4.45   Hemoglobin 13.0 - 17.0 g/dL 86.8  87.0  86.8   HCT 39.0 - 52.0 % 39.9   39.3  40.4   Platelets 150 - 400 K/uL 355  318  280      There is no height or weight on file to calculate BMI.  Orders:  No orders of the defined types were placed in this encounter.  No orders of the defined types were placed in this encounter.    Procedures: No procedures performed  Clinical Data: No additional findings.  ROS:  All other systems negative, except as noted in the HPI. Review of Systems  Objective: Vital Signs: There were no vitals taken for this visit.  Specialty Comments:  No specialty comments available.  PMFS History: Patient Active Problem List   Diagnosis Date Noted   Gangrene of toe of left foot (HCC)    PVD (peripheral vascular disease)    CKD (chronic kidney disease) stage 3, GFR 30-59 ml/min (HCC) 01/21/2020   HTN (hypertension) 01/21/2020   DM2 (diabetes mellitus, type 2) (HCC) 01/21/2020   Leukocytosis 01/21/2020   Sepsis (HCC) 01/21/2020   Osteomyelitis of toe of left foot (HCC) 01/21/2020   Osteoarthritis of toe joint, left 01/21/2020   Past Medical History:  Diagnosis Date   Abscess of left lower leg 2024   Chronic kidney disease    CKD (chronic kidney disease) stage 3, GFR 30-59 ml/min (HCC)    Diabetes mellitus, type 2 (HCC)    diet controlled   History of kidney stones    X2 4or 5 yrs ago   Hypertension    controlled on meds   Leukocytosis    Peripheral vascular disease     Family History  Problem Relation Age of Onset   Diabetes Mother    Diabetes Father     Past Surgical History:  Procedure Laterality Date   ABDOMINAL AORTOGRAM W/LOWER EXTREMITY Bilateral 01/28/2020   Procedure: ABDOMINAL AORTOGRAM W/LOWER EXTREMITY;  Surgeon: Sheree Penne Bruckner, MD;  Location: [facility] INVASIVE CV LAB;  Service: Cardiovascular;  Laterality: Bilateral;   AMPUTATION Left 01/23/2020   Procedure: TRANSMETATARSAL AMPUTATION FOOT;  Surgeon: Harden Jerona GAILS, MD;  Location: [facility];  Service: Orthopedics;  Laterality: Left;   CATARACT  EXTRACTION W/PHACO Left 03/19/2019   Procedure: CATARACT EXTRACTION PHACO AND INTRAOCULAR LENS PLACEMENT (IOC)  LEFT DIABETIC symfony lens;  Surgeon: Myrna Adine Anes, MD;  Location: [facility];  Service: Ophthalmology;  Laterality: Left;  Diabetic - oral meds   CATARACT EXTRACTION W/PHACO Right 04/23/2019   Procedure: CATARACT EXTRACTION PHACO AND INTRAOCULAR LENS PLACEMENT (IOC)  RIGHT DIABETIC SYMFONY TORIC LENS;  Surgeon: Myrna Adine Anes, MD;  Location: [facility];  Service: Ophthalmology;  Laterality: Right;  Diabetes-oral med   CYSTOSCOPY WITH LITHOLAPAXY N/A 09/20/2023   Procedure: CYSTOSCOPY WITH LITHOLAPAXY;  Surgeon: Twylla Glendia BROCKS, MD;  Location: [facility] ORS;  Service: Urology;  Laterality: N/A;  MENISCUS REPAIR     ROTATOR CUFF REPAIR  10/2015   Social History   Occupational History   Not on file  Tobacco Use   Smoking status: Never   Smokeless tobacco: Never  Vaping Use   Vaping status: Never Used  Substance and Sexual Activity   Alcohol use: Not Currently   Drug use: Never   Sexual activity: Not on file         "

## 2024-10-08 ENCOUNTER — Encounter: Payer: Self-pay | Admitting: Orthopedic Surgery

## 2024-10-08 ENCOUNTER — Encounter: Admitting: Orthopedic Surgery

## 2024-10-08 DIAGNOSIS — Z89432 Acquired absence of left foot: Secondary | ICD-10-CM | POA: Diagnosis not present

## 2024-10-08 DIAGNOSIS — L97511 Non-pressure chronic ulcer of other part of right foot limited to breakdown of skin: Secondary | ICD-10-CM | POA: Diagnosis not present

## 2024-10-08 DIAGNOSIS — I739 Peripheral vascular disease, unspecified: Secondary | ICD-10-CM

## 2024-10-08 NOTE — Progress Notes (Signed)
 "  Office Visit Note   Patient: Brent Lam           Date of Birth: 1956/01/31           MRN: [id number] Visit Date: 10/08/2024              Requested by: Brent Oneil FALCON, MD [phone] [facility] [address] [facility] [place],  [place] [address] PCP: Brent Oneil FALCON, MD  Chief Complaint  Patient presents with   Right Foot - Follow-up   Left Foot - Follow-up      HPI: Discussed the use of AI scribe software for clinical note transcription with the patient, who gave verbal consent to proceed.  History of Present Illness Brent Lam is a 69 year old male with bilateral chronic foot ulcers and left foot transmetatarsal amputation who presents for follow-up of foot wounds and callus management.  He has a chronic ulcer at the distal aspect of the left foot following transmetatarsal amputation. He experiences persistent xerosis despite regular use of emollients and inquires about additional skin care options, including pumice stone use. He utilizes felt relieving donuts and cushioning for offloading. He reports improvement in pain and decreased callus thickness following recent debridement, and requests additional cushioning for ongoing management.  He has a chronic ulcer beneath the right second metatarsal head. He continues to use offloading devices and has undergone serial debridements. He does not report new or worsening symptoms related to the right foot ulcer and notes improvement in discomfort with recent care.  He was recently diagnosed with bladder cancer. He describes ongoing hematuria and passage of blood clots since March, following a procedure for nephrolithiasis. Further treatment will be determined after surgical resection. He describes ongoing hematuria and passage of blood clots since March, following a procedure for nephrolithiasis. Further treatment will be determined after surgical resection.     Assessment & Plan: Visit Diagnoses:  1. PVD (peripheral vascular  disease)   2. History of transmetatarsal amputation of left foot (HCC)   3. 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 Ulcer size increased post-debridement.  - Obtained informed consent for debridement. - Debrided skin and soft tissue to bleeding viable granulation tissue using a ten blade knife. - Applied silver  nitrate to the wound. - Plan to reevaluate in four weeks.  Chronic ulcer of right foot beneath second metatarsal head Persistent Wagner grade 1 ulcer beneath right second metatarsal head. - Obtained informed consent for debridement. - Debrided skin and soft tissue to bleeding viable granulation tissue using a ten blade knife. - Applied silver  nitrate to the wound. - Plan to reevaluate in four weeks.  Callus formation of foot Callus formation less thick and symptomatic after regular shaving and offloading. - Shaved down callus to reduce thickness and discomfort. - Arranged for additional felt offloading donuts. - Provided education on continued use of lotion for xerosis and discussed optional use of pumice stone. - Plan to reevaluate in four weeks.      Follow-Up Instructions: Return in about 4 weeks (around 11/05/2024).   Ortho Exam  Patient is alert, oriented, no adenopathy, well-dressed, normal affect, normal respiratory effort. Physical Exam EXTREMITIES: Left foot transmetatarsal amputation with distal ulcer. Right foot persistent Wagner grade 1 ulcer beneath second metatarsal head.   After informed consent a 10 blade  knife was used to debride the skin and soft tissue back to bleeding viable granulation tissue on the left transmetatarsal amputation and right foot.  After debridement the right second metatarsal head ulcer is 1 cm in diameter and the left foot ulcer is 2 cm in  diameter.   Imaging: No results found. No images are attached to the encounter.  Labs: Lab Results  Component Value Date   HGBA1C 7.1 (H) 01/21/2020   REPTSTATUS 05/21/2020 FINAL 05/19/2020   GRAMSTAIN NO WBC SEEN RARE GRAM POSITIVE COCCI  01/23/2020   CULT 50,000 COLONIES/mL STAPHYLOCOCCUS HAEMOLYTICUS (A) 05/19/2020   LABORGA STAPHYLOCOCCUS HAEMOLYTICUS (A) 05/19/2020     Lab Results  Component Value Date   ALBUMIN 3.7 01/21/2020    No results found for: MG No results found for: VD25OH  No results found for: PREALBUMIN    Latest Ref Rng & Units 01/29/2020    1:36 AM 01/28/2020    5:43 AM 01/26/2020    8:42 AM  CBC EXTENDED  WBC 4.0 - 10.5 K/uL 11.9  11.3  13.2   RBC 4.22 - 5.81 MIL/uL 4.43  4.36  4.45   Hemoglobin 13.0 - 17.0 g/dL 86.8  87.0  86.8   HCT 39.0 - 52.0 % 39.9  39.3  40.4   Platelets 150 - 400 K/uL 355  318  280      There is no height or weight on file to calculate BMI.  Orders:  No orders of the defined types were placed in this encounter.  No orders of the defined types were placed in this encounter.    Procedures: No procedures performed  Clinical Data: No additional findings.  ROS:  All other systems negative, except as noted in the HPI. Review of Systems  Objective: Vital Signs: There were no vitals taken for this visit.  Specialty Comments:  No specialty comments available.  PMFS History: Patient Active Problem List   Diagnosis Date Noted   Gangrene of toe of left foot (HCC)    PVD (peripheral vascular disease)    CKD (chronic kidney disease) stage 3, GFR 30-59 ml/min (HCC) 01/21/2020   HTN (hypertension) 01/21/2020   DM2 (diabetes mellitus, type 2) (HCC) 01/21/2020   Leukocytosis 01/21/2020   Sepsis (HCC) 01/21/2020   Osteomyelitis of toe of left foot (HCC) 01/21/2020   Osteoarthritis of toe joint, left 01/21/2020   Past Medical History:  Diagnosis Date   Abscess of left lower leg 2024   Chronic kidney  disease    CKD (chronic kidney disease) stage 3, GFR 30-59 ml/min (HCC)    Diabetes mellitus, type 2 (HCC)    diet controlled   History of kidney stones    X2 4or 5 yrs ago   Hypertension    controlled on meds   Leukocytosis    Peripheral vascular disease     Family History  Problem Relation Age of Onset   Diabetes Mother    Diabetes Father     Past Surgical History:  Procedure Laterality Date   ABDOMINAL AORTOGRAM W/LOWER EXTREMITY Bilateral 01/28/2020   Procedure: ABDOMINAL AORTOGRAM W/LOWER EXTREMITY;  Surgeon: Sheree Penne Bruckner, MD;  Location: [facility] INVASIVE CV LAB;  Service: Cardiovascular;  Laterality: Bilateral;   AMPUTATION Left 01/23/2020   Procedure: TRANSMETATARSAL AMPUTATION FOOT;  Surgeon: Harden Jerona GAILS, MD;  Location: [facility] OR;  Service: Orthopedics;  Laterality: Left;   CATARACT EXTRACTION W/PHACO Left 03/19/2019   Procedure: CATARACT EXTRACTION PHACO AND INTRAOCULAR LENS PLACEMENT (IOC)  LEFT DIABETIC symfony  lens;  Surgeon: Myrna Adine Anes, MD;  Location: [facility];  Service: Ophthalmology;  Laterality: Left;  Diabetic - oral meds   CATARACT EXTRACTION W/PHACO Right 04/23/2019   Procedure: CATARACT EXTRACTION PHACO AND INTRAOCULAR LENS PLACEMENT (IOC)  RIGHT DIABETIC SYMFONY TORIC LENS;  Surgeon: Myrna Adine Anes, MD;  Location: [facility];  Service: Ophthalmology;  Laterality: Right;  Diabetes-oral med   CYSTOSCOPY WITH LITHOLAPAXY N/A 09/20/2023   Procedure: CYSTOSCOPY WITH LITHOLAPAXY;  Surgeon: Twylla Glendia BROCKS, MD;  Location: [facility] ORS;  Service: Urology;  Laterality: N/A;   MENISCUS REPAIR     ROTATOR CUFF REPAIR  10/2015   Social History   Occupational History   Not on file  Tobacco Use   Smoking status: Never   Smokeless tobacco: Never  Vaping Use   Vaping status: Never Used  Substance and Sexual Activity   Alcohol use: Not Currently   Drug use: Never   Sexual activity: Not on file         "

## 2024-10-09 ENCOUNTER — Other Ambulatory Visit: Payer: Self-pay | Admitting: Urology

## 2024-10-22 ENCOUNTER — Telehealth: Payer: Self-pay | Admitting: Orthopedic Surgery

## 2024-10-22 NOTE — Telephone Encounter (Signed)
 CALLED PT LEFT VM PROVIDER WILL NOT BE IN OFFICE 1/29 NOR 2/2. PT NEED TO R/S/

## 2024-11-05 ENCOUNTER — Ambulatory Visit: Admitting: Orthopedic Surgery

## 2024-11-06 ENCOUNTER — Encounter (HOSPITAL_COMMUNITY): Payer: Self-pay

## 2024-11-06 ENCOUNTER — Encounter (HOSPITAL_COMMUNITY)
Admission: RE | Admit: 2024-11-06 | Discharge: 2024-11-06 | Disposition: A | Source: Ambulatory Visit | Attending: Urology

## 2024-11-06 ENCOUNTER — Other Ambulatory Visit: Payer: Self-pay

## 2024-11-06 VITALS — BP 147/72 | HR 77 | Temp 98.6°F | Ht 73.0 in | Wt 251.0 lb

## 2024-11-06 DIAGNOSIS — Z01818 Encounter for other preprocedural examination: Secondary | ICD-10-CM | POA: Insufficient documentation

## 2024-11-06 DIAGNOSIS — I10 Essential (primary) hypertension: Secondary | ICD-10-CM | POA: Insufficient documentation

## 2024-11-06 LAB — BASIC METABOLIC PANEL WITH GFR
Anion gap: 10 (ref 5–15)
BUN: 14 mg/dL (ref 8–23)
CO2: 23 mmol/L (ref 22–32)
Calcium: 9.1 mg/dL (ref 8.9–10.3)
Chloride: 102 mmol/L (ref 98–111)
Creatinine, Ser: 1.52 mg/dL — ABNORMAL HIGH (ref 0.61–1.24)
GFR, Estimated: 50 mL/min — ABNORMAL LOW
Glucose, Bld: 229 mg/dL — ABNORMAL HIGH (ref 70–99)
Potassium: 4.3 mmol/L (ref 3.5–5.1)
Sodium: 135 mmol/L (ref 135–145)

## 2024-11-06 LAB — CBC
HCT: 37.7 % — ABNORMAL LOW (ref 39.0–52.0)
Hemoglobin: 11 g/dL — ABNORMAL LOW (ref 13.0–17.0)
MCH: 24.9 pg — ABNORMAL LOW (ref 26.0–34.0)
MCHC: 29.2 g/dL — ABNORMAL LOW (ref 30.0–36.0)
MCV: 85.3 fL (ref 80.0–100.0)
Platelets: 233 10*3/uL (ref 150–400)
RBC: 4.42 MIL/uL (ref 4.22–5.81)
RDW: 16.1 % — ABNORMAL HIGH (ref 11.5–15.5)
WBC: 6.1 10*3/uL (ref 4.0–10.5)
nRBC: 0 % (ref 0.0–0.2)

## 2024-11-06 LAB — GLUCOSE, CAPILLARY: Glucose-Capillary: 240 mg/dL — ABNORMAL HIGH (ref 70–99)

## 2024-11-06 NOTE — Progress Notes (Signed)
 For Anesthesia: PCP - Cleotilde Oneil FALCON, MD  Cardiologist - N/A  Bowel Prep reminder:  Chest x-ray -  EKG - 11/06/24 Stress Test -  ECHO -  Cardiac Cath -  Pacemaker/ICD device last checked: Pacemaker orders received: Device Rep notified:  Spinal Cord Stimulator:  Sleep Study - N/A CPAP -   Fasting Blood Sugar - N/A Checks Blood Sugar ___0__ times a day Date and result of last Hgb A1c-6.4: 08/21/24  Last dose of GLP1 agonist- N/A GLP1 instructions: Hold 7 days prior to schedule (Hold 24 hours-daily)   Last dose of SGLT-2 inhibitors- N/A SGLT-2 instructions: Hold 72 hours prior to surgery  Blood Thinner Instructions:N/A Last Dose: Time last taken:  Aspirin  Instructions:N/A Last Dose: Time last taken:  Activity level: Can go up a flight of stairs and activities of daily living without stopping and without chest pain and/or shortness of breath   Able to exercise without chest pain and/or shortness of breath  Anesthesia review: Hx:HTN,DIA,CKD 3,PVD.  Patient denies shortness of breath, fever, cough and chest pain at PAT appointment   Patient verbalized understanding of instructions that were reviewed over the telephone.

## 2024-11-15 ENCOUNTER — Ambulatory Visit (HOSPITAL_COMMUNITY): Admission: RE | Admit: 2024-11-15 | Source: Home / Self Care | Admitting: Urology

## 2024-11-15 ENCOUNTER — Encounter (HOSPITAL_COMMUNITY): Admission: RE | Payer: Self-pay | Source: Home / Self Care

## 2024-11-15 SURGERY — TURBT (TRANSURETHRAL RESECTION OF BLADDER TUMOR)
Anesthesia: General

## 2024-11-26 ENCOUNTER — Ambulatory Visit: Admitting: Orthopedic Surgery
